# Patient Record
Sex: Male | Born: 1965
Health system: Southern US, Community
[De-identification: ages and names within clinical notes are randomized; demographics above are authoritative.]

## PROBLEM LIST (undated history)

## (undated) DIAGNOSIS — R42 Dizziness and giddiness: Secondary | ICD-10-CM

## (undated) HISTORY — DX: Dizziness and giddiness: R42

---

## 2007-10-09 ENCOUNTER — Ambulatory Visit (HOSPITAL_COMMUNITY): Admission: RE | Admit: 2007-10-09 | Discharge: 2007-10-09 | Payer: Self-pay | Admitting: Orthopedic Surgery

## 2015-03-10 DIAGNOSIS — I25110 Atherosclerotic heart disease of native coronary artery with unstable angina pectoris: Secondary | ICD-10-CM

## 2015-03-10 DIAGNOSIS — I10 Essential (primary) hypertension: Secondary | ICD-10-CM | POA: Insufficient documentation

## 2015-03-10 DIAGNOSIS — E785 Hyperlipidemia, unspecified: Secondary | ICD-10-CM | POA: Insufficient documentation

## 2015-03-10 DIAGNOSIS — E1169 Type 2 diabetes mellitus with other specified complication: Secondary | ICD-10-CM | POA: Insufficient documentation

## 2015-03-10 DIAGNOSIS — I2510 Atherosclerotic heart disease of native coronary artery without angina pectoris: Secondary | ICD-10-CM

## 2015-03-10 DIAGNOSIS — Z6841 Body Mass Index (BMI) 40.0 and over, adult: Secondary | ICD-10-CM

## 2015-03-10 HISTORY — DX: Hyperlipidemia, unspecified: E78.5

## 2015-03-10 HISTORY — DX: Essential (primary) hypertension: I10

## 2015-03-10 HISTORY — DX: Atherosclerotic heart disease of native coronary artery with unstable angina pectoris: I25.110

## 2015-03-10 HISTORY — DX: Morbid (severe) obesity due to excess calories: E66.01

## 2015-03-10 HISTORY — DX: Body Mass Index (BMI) 40.0 and over, adult: Z684

## 2015-03-10 HISTORY — DX: Atherosclerotic heart disease of native coronary artery without angina pectoris: I25.10

## 2015-03-10 HISTORY — DX: Type 2 diabetes mellitus with other specified complication: E11.69

## 2015-06-19 DIAGNOSIS — E088 Diabetes mellitus due to underlying condition with unspecified complications: Secondary | ICD-10-CM | POA: Insufficient documentation

## 2015-06-19 HISTORY — DX: Diabetes mellitus due to underlying condition with unspecified complications: E08.8

## 2016-02-16 DIAGNOSIS — I10 Essential (primary) hypertension: Secondary | ICD-10-CM | POA: Diagnosis not present

## 2016-02-16 DIAGNOSIS — Z1389 Encounter for screening for other disorder: Secondary | ICD-10-CM | POA: Diagnosis not present

## 2016-02-16 DIAGNOSIS — Z79899 Other long term (current) drug therapy: Secondary | ICD-10-CM | POA: Diagnosis not present

## 2016-02-16 DIAGNOSIS — E1165 Type 2 diabetes mellitus with hyperglycemia: Secondary | ICD-10-CM | POA: Diagnosis not present

## 2016-02-16 DIAGNOSIS — E785 Hyperlipidemia, unspecified: Secondary | ICD-10-CM | POA: Diagnosis not present

## 2016-05-21 DIAGNOSIS — I10 Essential (primary) hypertension: Secondary | ICD-10-CM | POA: Diagnosis not present

## 2016-05-21 DIAGNOSIS — Z23 Encounter for immunization: Secondary | ICD-10-CM | POA: Diagnosis not present

## 2016-05-21 DIAGNOSIS — Z125 Encounter for screening for malignant neoplasm of prostate: Secondary | ICD-10-CM | POA: Diagnosis not present

## 2016-05-21 DIAGNOSIS — E1165 Type 2 diabetes mellitus with hyperglycemia: Secondary | ICD-10-CM | POA: Diagnosis not present

## 2016-05-21 DIAGNOSIS — G4730 Sleep apnea, unspecified: Secondary | ICD-10-CM | POA: Diagnosis not present

## 2016-05-21 DIAGNOSIS — E785 Hyperlipidemia, unspecified: Secondary | ICD-10-CM | POA: Diagnosis not present

## 2016-05-31 DIAGNOSIS — L810 Postinflammatory hyperpigmentation: Secondary | ICD-10-CM | POA: Diagnosis not present

## 2016-05-31 DIAGNOSIS — L820 Inflamed seborrheic keratosis: Secondary | ICD-10-CM | POA: Diagnosis not present

## 2016-05-31 DIAGNOSIS — C44619 Basal cell carcinoma of skin of left upper limb, including shoulder: Secondary | ICD-10-CM | POA: Diagnosis not present

## 2016-06-11 DIAGNOSIS — M7751 Other enthesopathy of right foot: Secondary | ICD-10-CM

## 2016-06-11 DIAGNOSIS — E1165 Type 2 diabetes mellitus with hyperglycemia: Secondary | ICD-10-CM | POA: Diagnosis not present

## 2016-06-11 DIAGNOSIS — IMO0001 Reserved for inherently not codable concepts without codable children: Secondary | ICD-10-CM

## 2016-06-11 DIAGNOSIS — Z794 Long term (current) use of insulin: Secondary | ICD-10-CM

## 2016-06-11 DIAGNOSIS — M7752 Other enthesopathy of left foot: Secondary | ICD-10-CM | POA: Diagnosis not present

## 2016-06-11 HISTORY — DX: Reserved for inherently not codable concepts without codable children: IMO0001

## 2016-06-11 HISTORY — DX: Other enthesopathy of right foot and ankle: M77.51

## 2016-06-21 DIAGNOSIS — L609 Nail disorder, unspecified: Secondary | ICD-10-CM | POA: Insufficient documentation

## 2016-06-21 DIAGNOSIS — E1165 Type 2 diabetes mellitus with hyperglycemia: Secondary | ICD-10-CM | POA: Diagnosis not present

## 2016-06-21 DIAGNOSIS — S99922A Unspecified injury of left foot, initial encounter: Secondary | ICD-10-CM | POA: Insufficient documentation

## 2016-06-21 DIAGNOSIS — Z794 Long term (current) use of insulin: Secondary | ICD-10-CM | POA: Diagnosis not present

## 2016-06-21 HISTORY — DX: Unspecified injury of left foot, initial encounter: S99.922A

## 2016-06-21 HISTORY — DX: Nail disorder, unspecified: L60.9

## 2016-07-09 DIAGNOSIS — S99922D Unspecified injury of left foot, subsequent encounter: Secondary | ICD-10-CM | POA: Diagnosis not present

## 2016-07-09 DIAGNOSIS — M7751 Other enthesopathy of right foot: Secondary | ICD-10-CM | POA: Diagnosis not present

## 2016-07-09 DIAGNOSIS — M7752 Other enthesopathy of left foot: Secondary | ICD-10-CM | POA: Diagnosis not present

## 2016-07-22 DIAGNOSIS — R0602 Shortness of breath: Secondary | ICD-10-CM | POA: Diagnosis not present

## 2016-07-22 DIAGNOSIS — G4730 Sleep apnea, unspecified: Secondary | ICD-10-CM | POA: Diagnosis not present

## 2016-08-02 DIAGNOSIS — J45901 Unspecified asthma with (acute) exacerbation: Secondary | ICD-10-CM | POA: Diagnosis not present

## 2016-08-05 DIAGNOSIS — J45909 Unspecified asthma, uncomplicated: Secondary | ICD-10-CM | POA: Diagnosis not present

## 2016-08-05 DIAGNOSIS — R0602 Shortness of breath: Secondary | ICD-10-CM | POA: Diagnosis not present

## 2016-08-05 DIAGNOSIS — G4700 Insomnia, unspecified: Secondary | ICD-10-CM | POA: Diagnosis not present

## 2016-08-28 DIAGNOSIS — E1165 Type 2 diabetes mellitus with hyperglycemia: Secondary | ICD-10-CM | POA: Diagnosis not present

## 2016-08-28 DIAGNOSIS — E785 Hyperlipidemia, unspecified: Secondary | ICD-10-CM | POA: Diagnosis not present

## 2016-08-28 DIAGNOSIS — I2510 Atherosclerotic heart disease of native coronary artery without angina pectoris: Secondary | ICD-10-CM | POA: Diagnosis not present

## 2016-08-28 DIAGNOSIS — J45909 Unspecified asthma, uncomplicated: Secondary | ICD-10-CM | POA: Diagnosis not present

## 2016-08-28 DIAGNOSIS — S0500XA Injury of conjunctiva and corneal abrasion without foreign body, unspecified eye, initial encounter: Secondary | ICD-10-CM | POA: Diagnosis not present

## 2016-08-28 DIAGNOSIS — I10 Essential (primary) hypertension: Secondary | ICD-10-CM | POA: Diagnosis not present

## 2016-08-29 DIAGNOSIS — S00211A Abrasion of right eyelid and periocular area, initial encounter: Secondary | ICD-10-CM | POA: Diagnosis not present

## 2016-10-16 DIAGNOSIS — H524 Presbyopia: Secondary | ICD-10-CM | POA: Diagnosis not present

## 2016-10-16 DIAGNOSIS — E113412 Type 2 diabetes mellitus with severe nonproliferative diabetic retinopathy with macular edema, left eye: Secondary | ICD-10-CM | POA: Diagnosis not present

## 2016-10-21 DIAGNOSIS — G4733 Obstructive sleep apnea (adult) (pediatric): Secondary | ICD-10-CM | POA: Diagnosis not present

## 2016-11-04 DIAGNOSIS — R0602 Shortness of breath: Secondary | ICD-10-CM | POA: Diagnosis not present

## 2016-11-04 DIAGNOSIS — G4730 Sleep apnea, unspecified: Secondary | ICD-10-CM | POA: Diagnosis not present

## 2016-11-04 DIAGNOSIS — J9801 Acute bronchospasm: Secondary | ICD-10-CM | POA: Diagnosis not present

## 2016-11-05 DIAGNOSIS — I2510 Atherosclerotic heart disease of native coronary artery without angina pectoris: Secondary | ICD-10-CM | POA: Diagnosis not present

## 2016-11-05 DIAGNOSIS — I10 Essential (primary) hypertension: Secondary | ICD-10-CM | POA: Diagnosis not present

## 2016-11-05 DIAGNOSIS — E088 Diabetes mellitus due to underlying condition with unspecified complications: Secondary | ICD-10-CM | POA: Diagnosis not present

## 2016-11-05 DIAGNOSIS — E785 Hyperlipidemia, unspecified: Secondary | ICD-10-CM | POA: Diagnosis not present

## 2016-12-25 DIAGNOSIS — E113412 Type 2 diabetes mellitus with severe nonproliferative diabetic retinopathy with macular edema, left eye: Secondary | ICD-10-CM | POA: Diagnosis not present

## 2017-01-04 DIAGNOSIS — R0600 Dyspnea, unspecified: Secondary | ICD-10-CM | POA: Diagnosis not present

## 2017-01-04 DIAGNOSIS — Z6841 Body Mass Index (BMI) 40.0 and over, adult: Secondary | ICD-10-CM | POA: Diagnosis not present

## 2017-01-04 DIAGNOSIS — R0902 Hypoxemia: Secondary | ICD-10-CM | POA: Diagnosis not present

## 2017-01-04 DIAGNOSIS — Z79899 Other long term (current) drug therapy: Secondary | ICD-10-CM | POA: Diagnosis not present

## 2017-01-04 DIAGNOSIS — R072 Precordial pain: Secondary | ICD-10-CM | POA: Diagnosis not present

## 2017-01-04 DIAGNOSIS — R000 Tachycardia, unspecified: Secondary | ICD-10-CM | POA: Diagnosis not present

## 2017-01-04 DIAGNOSIS — E119 Type 2 diabetes mellitus without complications: Secondary | ICD-10-CM | POA: Diagnosis not present

## 2017-01-04 DIAGNOSIS — Z794 Long term (current) use of insulin: Secondary | ICD-10-CM | POA: Diagnosis not present

## 2017-01-04 DIAGNOSIS — E785 Hyperlipidemia, unspecified: Secondary | ICD-10-CM | POA: Diagnosis not present

## 2017-01-04 DIAGNOSIS — Z713 Dietary counseling and surveillance: Secondary | ICD-10-CM | POA: Diagnosis not present

## 2017-01-04 DIAGNOSIS — I2510 Atherosclerotic heart disease of native coronary artery without angina pectoris: Secondary | ICD-10-CM | POA: Diagnosis not present

## 2017-01-04 DIAGNOSIS — R079 Chest pain, unspecified: Secondary | ICD-10-CM | POA: Diagnosis not present

## 2017-01-04 DIAGNOSIS — J45909 Unspecified asthma, uncomplicated: Secondary | ICD-10-CM | POA: Diagnosis not present

## 2017-01-04 DIAGNOSIS — R0602 Shortness of breath: Secondary | ICD-10-CM | POA: Diagnosis not present

## 2017-01-04 DIAGNOSIS — Z955 Presence of coronary angioplasty implant and graft: Secondary | ICD-10-CM | POA: Diagnosis not present

## 2017-01-04 DIAGNOSIS — I10 Essential (primary) hypertension: Secondary | ICD-10-CM | POA: Diagnosis not present

## 2017-01-05 DIAGNOSIS — I471 Supraventricular tachycardia: Secondary | ICD-10-CM | POA: Diagnosis not present

## 2017-01-05 DIAGNOSIS — I10 Essential (primary) hypertension: Secondary | ICD-10-CM | POA: Diagnosis not present

## 2017-01-05 DIAGNOSIS — E785 Hyperlipidemia, unspecified: Secondary | ICD-10-CM | POA: Diagnosis not present

## 2017-01-05 DIAGNOSIS — E119 Type 2 diabetes mellitus without complications: Secondary | ICD-10-CM | POA: Diagnosis not present

## 2017-01-05 DIAGNOSIS — R079 Chest pain, unspecified: Secondary | ICD-10-CM | POA: Diagnosis not present

## 2017-01-05 DIAGNOSIS — R0789 Other chest pain: Secondary | ICD-10-CM | POA: Diagnosis not present

## 2017-01-05 DIAGNOSIS — R0600 Dyspnea, unspecified: Secondary | ICD-10-CM | POA: Diagnosis not present

## 2017-01-06 DIAGNOSIS — E119 Type 2 diabetes mellitus without complications: Secondary | ICD-10-CM | POA: Diagnosis not present

## 2017-01-06 DIAGNOSIS — E785 Hyperlipidemia, unspecified: Secondary | ICD-10-CM | POA: Diagnosis not present

## 2017-01-06 DIAGNOSIS — R0600 Dyspnea, unspecified: Secondary | ICD-10-CM | POA: Diagnosis not present

## 2017-01-06 DIAGNOSIS — I10 Essential (primary) hypertension: Secondary | ICD-10-CM | POA: Diagnosis not present

## 2017-01-06 DIAGNOSIS — R079 Chest pain, unspecified: Secondary | ICD-10-CM | POA: Diagnosis not present

## 2017-01-06 DIAGNOSIS — R0789 Other chest pain: Secondary | ICD-10-CM | POA: Diagnosis not present

## 2017-01-06 DIAGNOSIS — I2510 Atherosclerotic heart disease of native coronary artery without angina pectoris: Secondary | ICD-10-CM | POA: Diagnosis not present

## 2017-01-09 DIAGNOSIS — H35033 Hypertensive retinopathy, bilateral: Secondary | ICD-10-CM | POA: Diagnosis not present

## 2017-01-09 DIAGNOSIS — E113493 Type 2 diabetes mellitus with severe nonproliferative diabetic retinopathy without macular edema, bilateral: Secondary | ICD-10-CM | POA: Diagnosis not present

## 2017-01-09 DIAGNOSIS — H43813 Vitreous degeneration, bilateral: Secondary | ICD-10-CM | POA: Diagnosis not present

## 2017-01-14 DIAGNOSIS — Z09 Encounter for follow-up examination after completed treatment for conditions other than malignant neoplasm: Secondary | ICD-10-CM | POA: Diagnosis not present

## 2017-01-14 DIAGNOSIS — E1165 Type 2 diabetes mellitus with hyperglycemia: Secondary | ICD-10-CM | POA: Diagnosis not present

## 2017-01-14 DIAGNOSIS — I2510 Atherosclerotic heart disease of native coronary artery without angina pectoris: Secondary | ICD-10-CM | POA: Diagnosis not present

## 2017-01-14 DIAGNOSIS — Z6841 Body Mass Index (BMI) 40.0 and over, adult: Secondary | ICD-10-CM | POA: Diagnosis not present

## 2017-01-14 DIAGNOSIS — Z79899 Other long term (current) drug therapy: Secondary | ICD-10-CM | POA: Diagnosis not present

## 2017-02-21 DIAGNOSIS — I471 Supraventricular tachycardia, unspecified: Secondary | ICD-10-CM | POA: Insufficient documentation

## 2017-02-21 HISTORY — DX: Supraventricular tachycardia, unspecified: I47.10

## 2017-02-21 HISTORY — DX: Supraventricular tachycardia: I47.1

## 2017-02-24 DIAGNOSIS — Z9989 Dependence on other enabling machines and devices: Secondary | ICD-10-CM | POA: Insufficient documentation

## 2017-02-24 DIAGNOSIS — G4733 Obstructive sleep apnea (adult) (pediatric): Secondary | ICD-10-CM

## 2017-02-24 DIAGNOSIS — R002 Palpitations: Secondary | ICD-10-CM | POA: Insufficient documentation

## 2017-02-24 DIAGNOSIS — I2510 Atherosclerotic heart disease of native coronary artery without angina pectoris: Secondary | ICD-10-CM | POA: Diagnosis not present

## 2017-02-24 DIAGNOSIS — I471 Supraventricular tachycardia: Secondary | ICD-10-CM | POA: Diagnosis not present

## 2017-02-24 DIAGNOSIS — E785 Hyperlipidemia, unspecified: Secondary | ICD-10-CM | POA: Diagnosis not present

## 2017-02-24 DIAGNOSIS — I10 Essential (primary) hypertension: Secondary | ICD-10-CM | POA: Diagnosis not present

## 2017-02-24 DIAGNOSIS — Z955 Presence of coronary angioplasty implant and graft: Secondary | ICD-10-CM | POA: Insufficient documentation

## 2017-02-24 HISTORY — DX: Dependence on other enabling machines and devices: Z99.89

## 2017-02-24 HISTORY — DX: Obstructive sleep apnea (adult) (pediatric): G47.33

## 2017-03-08 DIAGNOSIS — R002 Palpitations: Secondary | ICD-10-CM | POA: Diagnosis not present

## 2017-03-13 DIAGNOSIS — G4733 Obstructive sleep apnea (adult) (pediatric): Secondary | ICD-10-CM | POA: Diagnosis not present

## 2017-04-06 DIAGNOSIS — R002 Palpitations: Secondary | ICD-10-CM | POA: Diagnosis not present

## 2017-06-02 DIAGNOSIS — Z23 Encounter for immunization: Secondary | ICD-10-CM | POA: Diagnosis not present

## 2017-06-09 DIAGNOSIS — Z794 Long term (current) use of insulin: Secondary | ICD-10-CM | POA: Diagnosis not present

## 2017-06-09 DIAGNOSIS — S92014A Nondisplaced fracture of body of right calcaneus, initial encounter for closed fracture: Secondary | ICD-10-CM | POA: Insufficient documentation

## 2017-06-09 DIAGNOSIS — M79671 Pain in right foot: Secondary | ICD-10-CM | POA: Insufficient documentation

## 2017-06-09 DIAGNOSIS — S9031XA Contusion of right foot, initial encounter: Secondary | ICD-10-CM

## 2017-06-09 DIAGNOSIS — E1165 Type 2 diabetes mellitus with hyperglycemia: Secondary | ICD-10-CM | POA: Diagnosis not present

## 2017-06-09 HISTORY — DX: Pain in right foot: M79.671

## 2017-06-09 HISTORY — DX: Nondisplaced fracture of body of right calcaneus, initial encounter for closed fracture: S92.014A

## 2017-06-09 HISTORY — DX: Contusion of right foot, initial encounter: S90.31XA

## 2017-06-18 DIAGNOSIS — R040 Epistaxis: Secondary | ICD-10-CM | POA: Diagnosis not present

## 2017-06-18 DIAGNOSIS — M545 Low back pain: Secondary | ICD-10-CM | POA: Diagnosis not present

## 2017-06-23 DIAGNOSIS — R040 Epistaxis: Secondary | ICD-10-CM | POA: Diagnosis not present

## 2017-06-30 DIAGNOSIS — H43813 Vitreous degeneration, bilateral: Secondary | ICD-10-CM | POA: Diagnosis not present

## 2017-06-30 DIAGNOSIS — E113492 Type 2 diabetes mellitus with severe nonproliferative diabetic retinopathy without macular edema, left eye: Secondary | ICD-10-CM | POA: Diagnosis not present

## 2017-06-30 DIAGNOSIS — H35033 Hypertensive retinopathy, bilateral: Secondary | ICD-10-CM | POA: Diagnosis not present

## 2017-06-30 DIAGNOSIS — E113411 Type 2 diabetes mellitus with severe nonproliferative diabetic retinopathy with macular edema, right eye: Secondary | ICD-10-CM | POA: Diagnosis not present

## 2017-06-30 DIAGNOSIS — Z6841 Body Mass Index (BMI) 40.0 and over, adult: Secondary | ICD-10-CM | POA: Diagnosis not present

## 2017-06-30 DIAGNOSIS — I10 Essential (primary) hypertension: Secondary | ICD-10-CM | POA: Diagnosis not present

## 2017-07-07 DIAGNOSIS — R040 Epistaxis: Secondary | ICD-10-CM | POA: Diagnosis not present

## 2017-07-07 DIAGNOSIS — I10 Essential (primary) hypertension: Secondary | ICD-10-CM | POA: Diagnosis not present

## 2017-07-14 DIAGNOSIS — G4733 Obstructive sleep apnea (adult) (pediatric): Secondary | ICD-10-CM | POA: Diagnosis not present

## 2017-07-15 DIAGNOSIS — I2510 Atherosclerotic heart disease of native coronary artery without angina pectoris: Secondary | ICD-10-CM | POA: Diagnosis not present

## 2017-07-15 DIAGNOSIS — G4730 Sleep apnea, unspecified: Secondary | ICD-10-CM | POA: Diagnosis not present

## 2017-07-15 DIAGNOSIS — Z1331 Encounter for screening for depression: Secondary | ICD-10-CM | POA: Diagnosis not present

## 2017-07-15 DIAGNOSIS — E1165 Type 2 diabetes mellitus with hyperglycemia: Secondary | ICD-10-CM | POA: Diagnosis not present

## 2017-07-15 DIAGNOSIS — Z125 Encounter for screening for malignant neoplasm of prostate: Secondary | ICD-10-CM | POA: Diagnosis not present

## 2017-07-15 DIAGNOSIS — I10 Essential (primary) hypertension: Secondary | ICD-10-CM | POA: Diagnosis not present

## 2017-10-01 ENCOUNTER — Ambulatory Visit (HOSPITAL_BASED_OUTPATIENT_CLINIC_OR_DEPARTMENT_OTHER)
Admission: RE | Admit: 2017-10-01 | Discharge: 2017-10-01 | Disposition: A | Discharge status: Home / Self Care | Payer: BLUE CROSS/BLUE SHIELD | Source: Ambulatory Visit | Attending: Cardiology | Admitting: Cardiology

## 2017-10-01 ENCOUNTER — Ambulatory Visit: Payer: BLUE CROSS/BLUE SHIELD | Admitting: Cardiology

## 2017-10-01 ENCOUNTER — Encounter: Payer: Self-pay | Admitting: Cardiology

## 2017-10-01 VITALS — BP 150/74 | HR 84 | Ht 74.0 in | Wt 361.4 lb

## 2017-10-01 DIAGNOSIS — Z6841 Body Mass Index (BMI) 40.0 and over, adult: Secondary | ICD-10-CM | POA: Diagnosis not present

## 2017-10-01 DIAGNOSIS — Z01818 Encounter for other preprocedural examination: Secondary | ICD-10-CM | POA: Diagnosis not present

## 2017-10-01 DIAGNOSIS — I2510 Atherosclerotic heart disease of native coronary artery without angina pectoris: Secondary | ICD-10-CM

## 2017-10-01 DIAGNOSIS — R0602 Shortness of breath: Secondary | ICD-10-CM

## 2017-10-01 DIAGNOSIS — M79671 Pain in right foot: Secondary | ICD-10-CM | POA: Diagnosis not present

## 2017-10-01 DIAGNOSIS — E785 Hyperlipidemia, unspecified: Secondary | ICD-10-CM | POA: Diagnosis not present

## 2017-10-01 DIAGNOSIS — G4733 Obstructive sleep apnea (adult) (pediatric): Secondary | ICD-10-CM | POA: Diagnosis not present

## 2017-10-01 DIAGNOSIS — I10 Essential (primary) hypertension: Secondary | ICD-10-CM

## 2017-10-01 DIAGNOSIS — R0600 Dyspnea, unspecified: Secondary | ICD-10-CM

## 2017-10-01 DIAGNOSIS — R0609 Other forms of dyspnea: Secondary | ICD-10-CM

## 2017-10-01 DIAGNOSIS — I471 Supraventricular tachycardia: Secondary | ICD-10-CM

## 2017-10-01 DIAGNOSIS — E088 Diabetes mellitus due to underlying condition with unspecified complications: Secondary | ICD-10-CM

## 2017-10-01 DIAGNOSIS — Z9989 Dependence on other enabling machines and devices: Secondary | ICD-10-CM

## 2017-10-01 HISTORY — DX: Shortness of breath: R06.02

## 2017-10-01 MED ORDER — NITROGLYCERIN 0.4 MG SL SUBL: 0.4000 mg | SUBLINGUAL_TABLET | SUBLINGUAL | 6 refills | Status: DC | PRN

## 2017-10-01 NOTE — Progress Notes (Signed)
Cardiology Office Note:    Date:  10/01/2017   ID:  Kevin Lawrence, DOB 06/29/1966, MRN [id number]  PCP:  Campbell, Stephen D., MD  Cardiologist:  Rajan R Revankar, MD   Referring MD: No ref. provider found    ASSESSMENT:    1. DOE (dyspnea on exertion)   2. CAD in native artery   3. Essential hypertension   4. SVT (supraventricular tachycardia) (HCC)   5. Diabetes mellitus due to underlying condition with complication, unspecified whether long term insulin use (HCC)   6. OSA on CPAP   7. Dyslipidemia   8. Morbid obesity with BMI of 45.0-49.9, adult (HCC)   9. Right foot pain    PLAN:    In order of problems listed above:  1. Secondary prevention stressed with patient.  Importance of compliance with diet and medications stressed and he vocalized understanding. 2. Diet was discussed for dyslipidemia and diabetes mellitus.  Weight reduction was stressed.  Risks of obesity explained and he tells me that he is going to work on this to make it better. 3. The patient's symptoms are very concerning.  He has multiple continued risk factors for coronary artery disease.In view of the patient's symptoms, I discussed with the patient options for evaluation. Invasive and noninvasive options were given to the patient. I discussed stress testing and coronary angiography and left heart catheterization at length. Benefits, pros and cons of each approach were discussed at length. Patient had multiple questions which were answered to the patient's satisfaction. Patient opted for invasive evaluation and we will set up for coronary angiography and left heart catheterization. Further recommendations will be made based on the findings with coronary angiography. In the interim if the patient has any significant symptoms in hospital to the nearest emergency room. 4. Sublingual nitroglycerin prescription was sent, its protocol and 911 protocol explained and the patient vocalized understanding questions were  answered to the patient's satisfaction    Medication Adjustments/Labs and Tests Ordered: Current medicines are reviewed at length with the patient today.  Concerns regarding medicines are outlined above.  No orders of the defined types were placed in this encounter.  No orders of the defined types were placed in this encounter.    Chief Complaint  Patient presents with  . Follow-up    SOB and chest tightness.     History of Present Illness:    Kevin Lawrence is a 51 y.o. male .  Patient has known coronary artery disease and has undergone coronary angiography in 2016.  He underwent coronary stenting.  He had multiple other nonobstructive disease at that time.  Unfortunately this gentleman has not done his best in terms of secondary prevention.  Is morbidly obese, he does not exercise on a regular basis and has a very sedentary lifestyle.  He has a history of essential hypertension, dyslipidemia and diabetes mellitus.  Now he mentions to me that he has shortness of breath of exertion which is out of proportion to what he is generally used to.  This is getting worse.  This patient has been under my care in my previous practice.  He is here now to transfer his care and be established with my current practice.  At the time of my evaluation, the patient is alert awake oriented and in no distress.  The patient mentions to me that he has been seen by a cardiologist in my previous practice.  I reviewed that record and found that the patient has significant PVCs   that are happening which at times give him symptoms.   Current Medications: Current Meds  Medication Sig  . amLODipine (NORVASC) 10 MG tablet   . amLODipine (NORVASC) 5 MG tablet Take by mouth.  . aspirin EC 81 MG tablet Take by mouth.  . BAYER CONTOUR TEST test strip   . budesonide-formoterol (SYMBICORT) 160-4.5 MCG/ACT inhaler   . carvedilol (COREG) 12.5 MG tablet Take by mouth.  . carvedilol (COREG) 25 MG tablet Take 25 mg by mouth 2  (two) times daily.  . colesevelam (WELCHOL) 625 MG tablet Take by mouth.  . hydrALAZINE (APRESOLINE) 25 MG tablet Take 25 mg by mouth 4 (four) times daily.  . liraglutide (VICTOZA) 18 MG/3ML SOPN   . losartan (COZAAR) 100 MG tablet Take by mouth.  . metoprolol succinate (TOPROL-XL) 100 MG 24 hr tablet Take 100 mg by mouth.  . Multiple Vitamin (MULTI-VITAMINS) TABS Take by mouth.  . nitroGLYCERIN (NITROSTAT) 0.4 MG SL tablet Place under the tongue.  . pioglitazone-metformin (ACTOPLUS MET) 15-850 MG tablet Take by mouth.  . prasugrel (EFFIENT) 10 MG TABS tablet TAKE 1 TABLET (10 MG TOTAL) BY MOUTH DAILY.  . rosuvastatin (CRESTOR) 40 MG tablet Take 40 mg by mouth daily.  . TOUJEO SOLOSTAR 300 UNIT/ML SOPN INJECT 80 UNITS DAILY  . VICTOZA 18 MG/3ML SOPN   . WELCHOL 625 MG tablet Take 1,875 mg by mouth 2 (two) times daily.  . [DISCONTINUED] prasugrel (EFFIENT) 10 MG TABS tablet Take 10 mg by mouth.     Allergies:   Codeine and Hydrocodone-acetaminophen   Social History   Socioeconomic History  . Marital status: Single    Spouse name: None  . Number of children: None  . Years of education: None  . Highest education level: None  Social Needs  . Financial resource strain: None  . Food insecurity - worry: None  . Food insecurity - inability: None  . Transportation needs - medical: None  . Transportation needs - non-medical: None  Occupational History  . None  Tobacco Use  . Smoking status: Former Smoker  . Smokeless tobacco: Former User  Substance and Sexual Activity  . Alcohol use: None  . Drug use: None  . Sexual activity: None  Other Topics Concern  . None  Social History Narrative  . None     Family History: The patient's family history includes Cancer in his mother; Heart disease in his father; Stroke in his mother.  ROS:   Please see the history of present illness.    All other systems reviewed and are negative.  EKGs/Labs/Other Studies Reviewed:    The following  studies were reviewed today: EKG done today reveals sinus rhythm and nonspecific ST-T changes   Recent Labs: No results found for requested labs within last 8760 hours.  Recent Lipid Panel No results found for: CHOL, TRIG, HDL, CHOLHDL, VLDL, LDLCALC, LDLDIRECT  Physical Exam:    VS:  BP (!) 150/74 (BP Location: Left Arm, Patient Position: Sitting, Cuff Size: Large)   Pulse 84   Ht 6' 2" (1.88 m)   Wt (!) 361 lb 6.4 oz (163.9 kg)   SpO2 98%   BMI 46.40 kg/m     Wt Readings from Last 3 Encounters:  10/01/17 (!) 361 lb 6.4 oz (163.9 kg)     GEN: Patient is in no acute distress HEENT: Normal NECK: No JVD; No carotid bruits LYMPHATICS: No lymphadenopathy CARDIAC: Hear sounds regular, 2/6 systolic murmur at the apex. RESPIRATORY:  Clear to   auscultation without rales, wheezing or rhonchi  ABDOMEN: Soft, non-tender, non-distended MUSCULOSKELETAL:  No edema; No deformity  SKIN: Warm and dry NEUROLOGIC:  Alert and oriented x 3 PSYCHIATRIC:  Normal affect   Signed, Jenean Lindau, MD  10/01/2017 2:21 PM    [facility]

## 2017-10-01 NOTE — H&P (View-Only) (Signed)
Cardiology Office Note:    Date:  10/01/2017   ID:  Kevin Lawrence, DOB March 10, 1966, MRN [id number]  PCP:  Helen Hashimoto., MD  Cardiologist:  Jenean Lindau, MD   Referring MD: No ref. provider found    ASSESSMENT:    1. DOE (dyspnea on exertion)   2. CAD in native artery   3. Essential hypertension   4. SVT (supraventricular tachycardia) ([place])   5. Diabetes mellitus due to underlying condition with complication, unspecified whether long term insulin use ([place])   6. OSA on CPAP   7. Dyslipidemia   8. Morbid obesity with BMI of 45.0-49.9, adult ([place])   9. Right foot pain    PLAN:    In order of problems listed above:  1. Secondary prevention stressed with patient.  Importance of compliance with diet and medications stressed and he vocalized understanding. 2. Diet was discussed for dyslipidemia and diabetes mellitus.  Weight reduction was stressed.  Risks of obesity explained and he tells me that he is going to work on this to make it better. 3. The patient's symptoms are very concerning.  He has multiple continued risk factors for coronary artery disease.In view of the patient's symptoms, I discussed with the patient options for evaluation. Invasive and noninvasive options were given to the patient. I discussed stress testing and coronary angiography and left heart catheterization at length. Benefits, pros and cons of each approach were discussed at length. Patient had multiple questions which were answered to the patient's satisfaction. Patient opted for invasive evaluation and we will set up for coronary angiography and left heart catheterization. Further recommendations will be made based on the findings with coronary angiography. In the interim if the patient has any significant symptoms in hospital to the nearest emergency room. 4. Sublingual nitroglycerin prescription was sent, its protocol and 911 protocol explained and the patient vocalized understanding questions were  answered to the patient's satisfaction    Medication Adjustments/Labs and Tests Ordered: Current medicines are reviewed at length with the patient today.  Concerns regarding medicines are outlined above.  No orders of the defined types were placed in this encounter.  No orders of the defined types were placed in this encounter.    Chief Complaint  Patient presents with  . Follow-up    SOB and chest tightness.     History of Present Illness:    Kevin Lawrence is a 52 y.o. male .  Patient has known coronary artery disease and has undergone coronary angiography in 2016.  He underwent coronary stenting.  He had multiple other nonobstructive disease at that time.  Unfortunately this gentleman has not done his best in terms of secondary prevention.  Is morbidly obese, he does not exercise on a regular basis and has a very sedentary lifestyle.  He has a history of essential hypertension, dyslipidemia and diabetes mellitus.  Now he mentions to me that he has shortness of breath of exertion which is out of proportion to what he is generally used to.  This is getting worse.  This patient has been under my care in my previous practice.  He is here now to transfer his care and be established with my current practice.  At the time of my evaluation, the patient is alert awake oriented and in no distress.  The patient mentions to me that he has been seen by a cardiologist in my previous practice.  I reviewed that record and found that the patient has significant PVCs  that are happening which at times give him symptoms.   Current Medications: Current Meds  Medication Sig  . amLODipine (NORVASC) 10 MG tablet   . amLODipine (NORVASC) 5 MG tablet Take by mouth.  Marland Kitchen aspirin EC 81 MG tablet Take by mouth.  Marland Kitchen BAYER CONTOUR TEST test strip   . budesonide-formoterol (SYMBICORT) 160-4.5 MCG/ACT inhaler   . carvedilol (COREG) 12.5 MG tablet Take by mouth.  . carvedilol (COREG) 25 MG tablet Take 25 mg by mouth 2  (two) times daily.  . colesevelam (WELCHOL) 625 MG tablet Take by mouth.  . hydrALAZINE (APRESOLINE) 25 MG tablet Take 25 mg by mouth 4 (four) times daily.  Marland Kitchen liraglutide (VICTOZA) 18 MG/3ML SOPN   . losartan (COZAAR) 100 MG tablet Take by mouth.  . metoprolol succinate (TOPROL-XL) 100 MG 24 hr tablet Take 100 mg by mouth.  . Multiple Vitamin (MULTI-VITAMINS) TABS Take by mouth.  . nitroGLYCERIN (NITROSTAT) 0.4 MG SL tablet Place under the tongue.  . pioglitazone-metformin (ACTOPLUS MET) 15-850 MG tablet Take by mouth.  . prasugrel (EFFIENT) 10 MG TABS tablet TAKE 1 TABLET (10 MG TOTAL) BY MOUTH DAILY.  . rosuvastatin (CRESTOR) 40 MG tablet Take 40 mg by mouth daily.  Nelva Nay SOLOSTAR 300 UNIT/ML SOPN INJECT 80 UNITS DAILY  . VICTOZA 18 MG/3ML SOPN   . WELCHOL 625 MG tablet Take 1,875 mg by mouth 2 (two) times daily.  . [DISCONTINUED] prasugrel (EFFIENT) 10 MG TABS tablet Take 10 mg by mouth.     Allergies:   Codeine and Hydrocodone-acetaminophen   Social History   Socioeconomic History  . Marital status: Single    Spouse name: None  . Number of children: None  . Years of education: None  . Highest education level: None  Social Needs  . Financial resource strain: None  . Food insecurity - worry: None  . Food insecurity - inability: None  . Transportation needs - medical: None  . Transportation needs - non-medical: None  Occupational History  . None  Tobacco Use  . Smoking status: Former Research scientist (life sciences)  . Smokeless tobacco: Former Network engineer and Sexual Activity  . Alcohol use: None  . Drug use: None  . Sexual activity: None  Other Topics Concern  . None  Social History Narrative  . None     Family History: The patient's family history includes Cancer in his mother; Heart disease in his father; Stroke in his mother.  ROS:   Please see the history of present illness.    All other systems reviewed and are negative.  EKGs/Labs/Other Studies Reviewed:    The following  studies were reviewed today: EKG done today reveals sinus rhythm and nonspecific ST-T changes   Recent Labs: No results found for requested labs within last 8760 hours.  Recent Lipid Panel No results found for: CHOL, TRIG, HDL, CHOLHDL, VLDL, LDLCALC, LDLDIRECT  Physical Exam:    VS:  BP (!) 150/74 (BP Location: Left Arm, Patient Position: Sitting, Cuff Size: Large)   Pulse 84   Ht 6' 2" (1.88 m)   Wt (!) 361 lb 6.4 oz (163.9 kg)   SpO2 98%   BMI 46.40 kg/m     Wt Readings from Last 3 Encounters:  10/01/17 (!) 361 lb 6.4 oz (163.9 kg)     GEN: Patient is in no acute distress HEENT: Normal NECK: No JVD; No carotid bruits LYMPHATICS: No lymphadenopathy CARDIAC: Hear sounds regular, 2/6 systolic murmur at the apex. RESPIRATORY:  Clear to  auscultation without rales, wheezing or rhonchi  ABDOMEN: Soft, non-tender, non-distended MUSCULOSKELETAL:  No edema; No deformity  SKIN: Warm and dry NEUROLOGIC:  Alert and oriented x 3 PSYCHIATRIC:  Normal affect   Signed, Jenean Lindau, MD  10/01/2017 2:21 PM    [facility]

## 2017-10-01 NOTE — Patient Instructions (Addendum)
Medication Instructions:  Your physician recommends that you continue on your current medications as directed. Please refer to the Current Medication list given to you today.  Labwork: Your physician recommends that you have the following labs drawn: CBC, BMP and INR  Testing/Procedures: A chest x-ray takes a picture of the organs and structures inside the chest, including the heart, lungs, and blood vessels. This test can show several things, including, whether the heart is enlarges; whether fluid is building up in the lungs; and whether pacemaker / defibrillator leads are still in place.    [facility] [place] [address] [place] [address] Dept: [phone] Loc: [place]  10/01/2017  You are scheduled for a Cardiac Catheterization on Monday, February 11 with Dr. Daneen Schick.  1. Please arrive at the [facility] (Main Entrance A) at [facility]: [address] [place], [place] [address] at 8:00 AM (two hours before your procedure to ensure your preparation). Free valet parking service is available.   Special note: Every effort is made to have your procedure done on time. Please understand that emergencies sometimes delay scheduled procedures.  2. Diet: Do not eat or drink anything after midnight prior to your procedure except sips of water to take medications.  3. Labs: Done on 02/11.  4. Medication instructions in preparation for your procedure:  Stop taking, Actoplus Met (Metformin + Pioglitazone) on Sunday, February 10.  Please be sure to hold this medication for 48 hours after the heart cath.  Do not take your lantus and victoza the morning of.  On the morning of your procedure, take your Aspirin and any morning medicines NOT listed above.  You may use sips of water.  5. Plan for one night stay--bring personal belongings. 6. Bring a current list of your  medications and current insurance cards. 7. You MUST have a responsible person to drive you home. 8. Someone MUST be with you the first 24 hours after you arrive home or your discharge will be delayed. 9. Please wear clothes that are easy to get on and off and wear slip-on shoes.  Thank you for allowing [place] to care for you!   -- [facility]   Follow-Up: Your physician recommends that you schedule a follow-up appointment in: 1 month  Any Other Special Instructions Will Be Listed Below (If Applicable).     If you need a refill on your cardiac medications before your next appointment, please call your pharmacy.   Travis Ranch, RN, BSN    Coronary Angiogram With Stent Coronary angiogram with stent placement is a procedure to widen or open a narrow blood vessel of the heart (coronary artery). Arteries may become blocked by cholesterol buildup (plaques) in the lining of the wall. When a coronary artery becomes partially blocked, blood flow to that area decreases. This may lead to chest pain or a heart attack (myocardial infarction). A stent is a small piece of metal that looks like mesh or a spring. Stent placement may be done as treatment for a heart attack or right after a coronary angiogram in which a blocked artery is found. Let your health care provider know about:  Any allergies you have.  All medicines you are taking, including vitamins, herbs, eye drops, creams, and over-the-counter medicines.  Any problems you or family members have had with anesthetic medicines.  Any blood disorders  you have.  Any surgeries you have had.  Any medical conditions you have.  Whether you are pregnant or may be pregnant. What are the risks? Generally, this is a safe procedure. However, problems may occur, including:  Damage to the heart or its blood vessels.  A return of blockage.  Bleeding, infection, or bruising at the insertion  site.  A collection of blood under the skin (hematoma) at the insertion site.  A blood clot in another part of the body.  Kidney injury.  Allergic reaction to the dye or contrast that is used.  Bleeding into the abdomen (retroperitoneal bleeding).  What happens before the procedure? Staying hydrated Follow instructions from your health care provider about hydration, which may include:  Up to 2 hours before the procedure - you may continue to drink clear liquids, such as water, clear fruit juice, black coffee, and plain tea.  Eating and drinking restrictions Follow instructions from your health care provider about eating and drinking, which may include:  8 hours before the procedure - stop eating heavy meals or foods such as meat, fried foods, or fatty foods.  6 hours before the procedure - stop eating light meals or foods, such as toast or cereal.  2 hours before the procedure - stop drinking clear liquids.  Ask your health care provider about:  Changing or stopping your regular medicines. This is especially important if you are taking diabetes medicines or blood thinners.  Taking medicines such as ibuprofen. These medicines can thin your blood. Do not take these medicines before your procedure if your health care provider instructs you not to. Generally, aspirin is recommended before a procedure of passing a small, thin tube (catheter) through a blood vessel and into the heart (cardiac catheterization).  What happens during the procedure?  An IV tube will be inserted into one of your veins.  You will be given one or more of the following: ? A medicine to help you relax (sedative). ? A medicine to numb the area where the catheter will be inserted into an artery (local anesthetic).  To reduce your risk of infection: ? Your health care team will wash or sanitize their hands. ? Your skin will be washed with soap. ? Hair may be removed from the area where the catheter will be  inserted.  Using a guide wire, the catheter will be inserted into an artery. The location may be in your groin, in your wrist, or in the fold of your arm (near your elbow).  A type of X-ray (fluoroscopy) will be used to help guide the catheter to the opening of the arteries in the heart.  A dye will be injected into the catheter, and X-rays will be taken. The dye will help to show where any narrowing or blockages are located in the arteries.  A tiny wire will be guided to the blocked spot, and a balloon will be inflated to make the artery wider.  The stent will be expanded and will crush the plaques into the wall of the vessel. The stent will hold the area open and improve the blood flow. Most stents have a drug coating to reduce the risk of the stent narrowing over time.  The artery may be made wider using a drill, laser, or other tools to remove plaques.  When the blood flow is better, the catheter will be removed. The lining of the artery will grow over the stent, which stays where it was placed. This procedure may vary  among health care providers and hospitals. What happens after the procedure?  If the procedure is done through the leg, you will be kept in bed lying flat for about 6 hours. You will be instructed to not bend and not cross your legs.  The insertion site will be checked frequently.  The pulse in your foot or wrist will be checked frequently.  You may have additional blood tests, X-rays, and a test that records the electrical activity of your heart (electrocardiogram, or ECG). This information is not intended to replace advice given to you by your health care provider. Make sure you discuss any questions you have with your health care provider. Document Released: 02/16/2003 Document Revised: 04/11/2016 Document Reviewed: 03/17/2016 Elsevier Interactive Patient Education  [employer].

## 2017-10-01 NOTE — Addendum Note (Signed)
Addended by: Carren RangGIPSON, Carson Meche on: 10/01/2017 04:06 PM   Modules accepted: Orders

## 2017-10-02 ENCOUNTER — Telehealth: Payer: Self-pay | Admitting: *Deleted

## 2017-10-02 LAB — BASIC METABOLIC PANEL
BUN / CREAT RATIO: 13 (ref 9–20)
BUN: 20 mg/dL (ref 6–24)
CALCIUM: 9.2 mg/dL (ref 8.7–10.2)
CO2: 21 mmol/L (ref 20–29)
Chloride: 102 mmol/L (ref 96–106)
Creatinine, Ser: 1.51 mg/dL — ABNORMAL HIGH (ref 0.76–1.27)
GFR calc non Af Amer: 52 mL/min/{1.73_m2} — ABNORMAL LOW (ref >59)
GFR, EST AFRICAN AMERICAN: 60 mL/min/{1.73_m2} (ref >59)
Glucose: 259 mg/dL — ABNORMAL HIGH (ref 65–99)
POTASSIUM: 4.3 mmol/L (ref 3.5–5.2)
Sodium: 141 mmol/L (ref 134–144)

## 2017-10-02 LAB — CBC WITH DIFFERENTIAL/PLATELET
BASOS: 1 %
Basophils Absolute: 0.1 10*3/uL (ref 0.0–0.2)
EOS (ABSOLUTE): 0.1 10*3/uL (ref 0.0–0.4)
Eos: 1 %
HEMOGLOBIN: 15 g/dL (ref 13.0–17.7)
Hematocrit: 44.7 % (ref 37.5–51.0)
IMMATURE GRANS (ABS): 0 10*3/uL (ref 0.0–0.1)
Immature Granulocytes: 0 %
LYMPHS: 24 %
Lymphocytes Absolute: 2.6 10*3/uL (ref 0.7–3.1)
MCH: 31 pg (ref 26.6–33.0)
MCHC: 33.6 g/dL (ref 31.5–35.7)
MCV: 92 fL (ref 79–97)
Monocytes Absolute: 1 10*3/uL — ABNORMAL HIGH (ref 0.1–0.9)
Monocytes: 10 %
NEUTROS ABS: 6.8 10*3/uL (ref 1.4–7.0)
Neutrophils: 64 %
Platelets: 282 10*3/uL (ref 150–379)
RBC: 4.84 x10E6/uL (ref 4.14–5.80)
RDW: 14.3 % (ref 12.3–15.4)
WBC: 10.5 10*3/uL (ref 3.4–10.8)

## 2017-10-02 LAB — PROTIME-INR
INR: 1 (ref 0.8–1.2)
Prothrombin Time: 10.5 s (ref 9.1–12.0)

## 2017-10-02 NOTE — Telephone Encounter (Signed)
-----   Message from Jacqlyn KraussAnne M Jaidin Ugarte, RN sent at 10/02/2017  1:36 PM EST ----- Regarding: Cath Monday Feb 11 Dr Katrinka BlazingSmith He is down for you to cath Monday 2/11-10:30 case arrive at [facility]  Dr Revankar's patient. Cr was 1.51 BUN 20 when it was checked 10/01/2017. I do not see an previous lab for comparison. He was instructed to increase his intake of water pre and post cath.  Do you have any further recommendations?  Katina DungAnne Lilliemae Fruge

## 2017-10-02 NOTE — Telephone Encounter (Signed)
Heart Catheterization scheduled at [facility] for: Monday February 11,2019 at 10:30 AM  Arrival time and place: [place] at: 8AM  HOLD medications AM of cath: Victoza AM of cath ACTOPLUS MET -none 10/05/17, 10/06/17 and 48 hours after cath Insulin AM of 1/2 Insulin night before  Except hold medications  all other AM meds can be  taken pre-cath with sip of water including: ASA 81 mg AM of cath Effient 10 mg AM of cath   Patient has responsible person to drive home post procedure and observe patient for 24 hours:  Left detailed voice mail message at [phone] [facility]) with this information for pt.

## 2017-10-02 NOTE — Telephone Encounter (Signed)
I spoke with Dr Katrinka BlazingSmith, no new recommendations pre-cath, he agreed with increase water pre and post cath.

## 2017-10-06 ENCOUNTER — Encounter (HOSPITAL_COMMUNITY)
Admission: RE | Disposition: A | Discharge status: Home / Self Care | Payer: Self-pay | Source: Ambulatory Visit | Attending: Interventional Cardiology

## 2017-10-06 ENCOUNTER — Ambulatory Visit (HOSPITAL_COMMUNITY)
Admission: RE | Admit: 2017-10-06 | Discharge: 2017-10-06 | Disposition: A | Discharge status: Home / Self Care | Payer: BLUE CROSS/BLUE SHIELD | Source: Ambulatory Visit | Attending: Interventional Cardiology | Admitting: Interventional Cardiology

## 2017-10-06 DIAGNOSIS — I2510 Atherosclerotic heart disease of native coronary artery without angina pectoris: Secondary | ICD-10-CM | POA: Diagnosis not present

## 2017-10-06 DIAGNOSIS — I10 Essential (primary) hypertension: Secondary | ICD-10-CM | POA: Diagnosis not present

## 2017-10-06 DIAGNOSIS — I2582 Chronic total occlusion of coronary artery: Secondary | ICD-10-CM | POA: Diagnosis not present

## 2017-10-06 DIAGNOSIS — Z9989 Dependence on other enabling machines and devices: Secondary | ICD-10-CM

## 2017-10-06 DIAGNOSIS — E785 Hyperlipidemia, unspecified: Secondary | ICD-10-CM | POA: Insufficient documentation

## 2017-10-06 DIAGNOSIS — E088 Diabetes mellitus due to underlying condition with unspecified complications: Secondary | ICD-10-CM | POA: Diagnosis present

## 2017-10-06 DIAGNOSIS — Z7982 Long term (current) use of aspirin: Secondary | ICD-10-CM | POA: Diagnosis not present

## 2017-10-06 DIAGNOSIS — Z87891 Personal history of nicotine dependence: Secondary | ICD-10-CM | POA: Diagnosis not present

## 2017-10-06 DIAGNOSIS — I25110 Atherosclerotic heart disease of native coronary artery with unstable angina pectoris: Secondary | ICD-10-CM | POA: Diagnosis present

## 2017-10-06 DIAGNOSIS — Z885 Allergy status to narcotic agent status: Secondary | ICD-10-CM | POA: Diagnosis not present

## 2017-10-06 DIAGNOSIS — I471 Supraventricular tachycardia: Secondary | ICD-10-CM | POA: Diagnosis not present

## 2017-10-06 DIAGNOSIS — T82855A Stenosis of coronary artery stent, initial encounter: Secondary | ICD-10-CM | POA: Diagnosis not present

## 2017-10-06 DIAGNOSIS — Z6841 Body Mass Index (BMI) 40.0 and over, adult: Secondary | ICD-10-CM | POA: Insufficient documentation

## 2017-10-06 DIAGNOSIS — R42 Dizziness and giddiness: Secondary | ICD-10-CM

## 2017-10-06 DIAGNOSIS — R0602 Shortness of breath: Secondary | ICD-10-CM

## 2017-10-06 DIAGNOSIS — E1169 Type 2 diabetes mellitus with other specified complication: Secondary | ICD-10-CM | POA: Diagnosis present

## 2017-10-06 DIAGNOSIS — G4733 Obstructive sleep apnea (adult) (pediatric): Secondary | ICD-10-CM | POA: Diagnosis not present

## 2017-10-06 DIAGNOSIS — Y831 Surgical operation with implant of artificial internal device as the cause of abnormal reaction of the patient, or of later complication, without mention of misadventure at the time of the procedure: Secondary | ICD-10-CM | POA: Insufficient documentation

## 2017-10-06 DIAGNOSIS — E118 Type 2 diabetes mellitus with unspecified complications: Secondary | ICD-10-CM | POA: Insufficient documentation

## 2017-10-06 HISTORY — PX: LEFT HEART CATH AND CORONARY ANGIOGRAPHY: CATH118249

## 2017-10-06 LAB — GLUCOSE, CAPILLARY
GLUCOSE-CAPILLARY: 93 mg/dL (ref 65–99)
GLUCOSE-CAPILLARY: 65 mg/dL (ref 65–99)
GLUCOSE-CAPILLARY: 82 mg/dL (ref 65–99)
Glucose-Capillary: 83 mg/dL (ref 65–99)
Glucose-Capillary: 74 mg/dL (ref 65–99)

## 2017-10-06 SURGERY — LEFT HEART CATH AND CORONARY ANGIOGRAPHY
Anesthesia: LOCAL

## 2017-10-06 MED ORDER — VERAPAMIL HCL 2.5 MG/ML IV SOLN
INTRAVENOUS | Status: AC
  Filled 2017-10-06: qty 2

## 2017-10-06 MED ORDER — DEXTROSE 50 % IV SOLN
INTRAVENOUS | Status: AC
  Administered 2017-10-06: 25 mL
  Filled 2017-10-06: qty 50

## 2017-10-06 MED ORDER — MIDAZOLAM HCL 2 MG/2ML IJ SOLN
INTRAMUSCULAR | Status: DC | PRN
  Administered 2017-10-06 (×2): 1 mg via INTRAVENOUS

## 2017-10-06 MED ORDER — FENTANYL CITRATE (PF) 100 MCG/2ML IJ SOLN
INTRAMUSCULAR | Status: AC
Start: 2017-10-06 — End: ?
  Filled 2017-10-06: qty 2

## 2017-10-06 MED ORDER — PRASUGREL HCL 10 MG PO TABS: 10.0000 mg | ORAL_TABLET | ORAL | Status: DC

## 2017-10-06 MED ORDER — IOPAMIDOL (ISOVUE-370) INJECTION 76%
INTRAVENOUS | Status: AC
  Filled 2017-10-06: qty 100

## 2017-10-06 MED ORDER — SODIUM CHLORIDE 0.9% FLUSH: 3.0000 mL | Freq: Two times a day (BID) | INTRAVENOUS | Status: DC

## 2017-10-06 MED ORDER — ASPIRIN 81 MG PO CHEW: 81.0000 mg | CHEWABLE_TABLET | Freq: Every day | ORAL | Status: DC

## 2017-10-06 MED ORDER — HEPARIN (PORCINE) IN NACL 2-0.9 UNIT/ML-% IJ SOLN
INTRAMUSCULAR | Status: AC | PRN
  Administered 2017-10-06: 1000 mL

## 2017-10-06 MED ORDER — SODIUM CHLORIDE 0.9% FLUSH: 3.0000 mL | INTRAVENOUS | Status: DC | PRN

## 2017-10-06 MED ORDER — IOPAMIDOL (ISOVUE-370) INJECTION 76%
INTRAVENOUS | Status: DC | PRN
  Administered 2017-10-06: 96 mL via INTRAVENOUS

## 2017-10-06 MED ORDER — ONDANSETRON HCL 4 MG/2ML IJ SOLN: 4.0000 mg | Freq: Four times a day (QID) | INTRAMUSCULAR | Status: DC | PRN

## 2017-10-06 MED ORDER — HEPARIN SODIUM (PORCINE) 1000 UNIT/ML IJ SOLN
INTRAMUSCULAR | Status: AC
  Filled 2017-10-06: qty 1

## 2017-10-06 MED ORDER — MIDAZOLAM HCL 2 MG/2ML IJ SOLN
INTRAMUSCULAR | Status: AC
  Filled 2017-10-06: qty 2

## 2017-10-06 MED ORDER — SODIUM CHLORIDE 0.9 % WEIGHT BASED INFUSION: 1.0000 mL/kg/h | INTRAVENOUS | Status: DC

## 2017-10-06 MED ORDER — LIDOCAINE HCL (PF) 1 % IJ SOLN
INTRAMUSCULAR | Status: DC | PRN
  Administered 2017-10-06: 2 mL

## 2017-10-06 MED ORDER — ACETAMINOPHEN 325 MG PO TABS: 650.0000 mg | ORAL_TABLET | ORAL | Status: DC | PRN

## 2017-10-06 MED ORDER — FENTANYL CITRATE (PF) 100 MCG/2ML IJ SOLN
INTRAMUSCULAR | Status: DC | PRN
  Administered 2017-10-06: 50 ug via INTRAVENOUS

## 2017-10-06 MED ORDER — SODIUM CHLORIDE 0.9 % IV SOLN: 250.0000 mL | INTRAVENOUS | Status: DC | PRN

## 2017-10-06 MED ORDER — LIDOCAINE HCL (PF) 1 % IJ SOLN
INTRAMUSCULAR | Status: AC
  Filled 2017-10-06: qty 30

## 2017-10-06 MED ORDER — ASPIRIN 81 MG PO CHEW: 81.0000 mg | CHEWABLE_TABLET | ORAL | Status: DC

## 2017-10-06 MED ORDER — SODIUM CHLORIDE 0.9 % IV SOLN: INTRAVENOUS | Status: DC

## 2017-10-06 MED ORDER — HEPARIN SODIUM (PORCINE) 1000 UNIT/ML IJ SOLN
INTRAMUSCULAR | Status: DC | PRN
  Administered 2017-10-06: 5000 [IU] via INTRAVENOUS

## 2017-10-06 MED ORDER — HEPARIN (PORCINE) IN NACL 2-0.9 UNIT/ML-% IJ SOLN
INTRAMUSCULAR | Status: AC
  Filled 2017-10-06: qty 1000

## 2017-10-06 MED ORDER — PRASUGREL HCL 10 MG PO TABS: 10.0000 mg | ORAL_TABLET | Freq: Every day | ORAL | Status: DC

## 2017-10-06 MED ORDER — SODIUM CHLORIDE 0.9 % IV SOLN
INTRAVENOUS | Status: AC | PRN
  Administered 2017-10-06: 1 mL/kg/h via INTRAVENOUS

## 2017-10-06 MED ORDER — SODIUM CHLORIDE 0.9 % WEIGHT BASED INFUSION
3.0000 mL/kg/h | INTRAVENOUS | Status: AC
  Administered 2017-10-06: 3 mL/kg/h via INTRAVENOUS

## 2017-10-06 MED ORDER — VERAPAMIL HCL 2.5 MG/ML IV SOLN
INTRAVENOUS | Status: DC | PRN
  Administered 2017-10-06: 10 mL via INTRA_ARTERIAL

## 2017-10-06 SURGICAL SUPPLY — 12 items
CATH 5FR JL3.5 JR4 ANG PIG MP (CATHETERS) ×2 IMPLANT
CATH INFINITI 5FR JL4 (CATHETERS) ×2 IMPLANT
CATH LAUNCHER 6FR EBU3.5 (CATHETERS) ×2 IMPLANT
COVER PRB 48X5XTLSCP FOLD TPE (BAG) ×1 IMPLANT
COVER PROBE 5X48 (BAG) ×1
DEVICE RAD COMP TR BAND LRG (VASCULAR PRODUCTS) ×2 IMPLANT
GLIDESHEATH SLEND A-KIT 6F 22G (SHEATH) ×2 IMPLANT
GUIDEWIRE INQWIRE 1.5J.035X260 (WIRE) ×1 IMPLANT
INQWIRE 1.5J .035X260CM (WIRE) ×2
KIT PREMIUM HAND CONTROLLER (KITS) ×2 IMPLANT
KIT SINGLE USE MANIFOLD (KITS) ×2 IMPLANT
PACK CARDIAC CATHETERIZATION (CUSTOM PROCEDURE TRAY) ×2 IMPLANT

## 2017-10-06 NOTE — Interval H&P Note (Signed)
Cath Lab Visit (complete for each Cath Lab visit)  Clinical Evaluation Leading to the Procedure:   ACS: No.  Non-ACS:    Anginal Classification: CCS III  Anti-ischemic medical therapy: Maximal Therapy (2 or more classes of medications)  Non-Invasive Test Results: High-risk stress test findings: cardiac mortality >3%/year  Prior CABG: No previous CABG      History and Physical Interval Note:  10/06/2017 12:23 PM  Francine Gravenoyle R Kern  has presented today for surgery, with the diagnosis of cad  The various methods of treatment have been discussed with the patient and family. After consideration of risks, benefits and other options for treatment, the patient has consented to  Procedure(s): LEFT HEART CATH AND CORONARY ANGIOGRAPHY (N/A) as a surgical intervention .  The patient's history has been reviewed, patient examined, no change in status, stable for surgery.  I have reviewed the patient's chart and labs.  Questions were answered to the patient's satisfaction.     Lyn RecordsHenry W Daila Elbert III

## 2017-10-06 NOTE — Discharge Instructions (Signed)
°  HOLD ACTOS PLUS METFORMIN FOR 48 HOURS  Radial Site Care Refer to this sheet in the next few weeks. These instructions provide you with information about caring for yourself after your procedure. Your health care provider may also give you more specific instructions. Your treatment has been planned according to current medical practices, but problems sometimes occur. Call your health care provider if you have any problems or questions after your procedure. What can I expect after the procedure? After your procedure, it is typical to have the following:  Bruising at the radial site that usually fades within 1-2 weeks.  Blood collecting in the tissue (hematoma) that may be painful to the touch. It should usually decrease in size and tenderness within 1-2 weeks.  Follow these instructions at home:  Take medicines only as directed by your health care provider.  You may shower 24-48 hours after the procedure or as directed by your health care provider. Remove the bandage (dressing) and gently wash the site with plain soap and water. Pat the area dry with a clean towel. Do not rub the site, because this may cause bleeding.  Do not take baths, swim, or use a hot tub until your health care provider approves.  Check your insertion site every day for redness, swelling, or drainage.  Do not apply powder or lotion to the site.  Do not flex or bend the affected arm for 24 hours or as directed by your health care provider.  Do not push or pull heavy objects with the affected arm for 24 hours or as directed by your health care provider.  Do not lift over 10 lb (4.5 kg) for 5 days after your procedure or as directed by your health care provider.  Ask your health care provider when it is okay to: ? Return to work or school. ? Resume usual physical activities or sports. ? Resume sexual activity.  Do not drive home if you are discharged the same day as the procedure. Have someone else drive  you.  You may drive 24 hours after the procedure unless otherwise instructed by your health care provider.  Do not operate machinery or power tools for 24 hours after the procedure.  If your procedure was done as an outpatient procedure, which means that you went home the same day as your procedure, a responsible adult should be with you for the first 24 hours after you arrive home.  Keep all follow-up visits as directed by your health care provider. This is important. Contact a health care provider if:  You have a fever.  You have chills.  You have increased bleeding from the radial site. Hold pressure on the site. Get help right away if:  You have unusual pain at the radial site.  You have redness, warmth, or swelling at the radial site.  You have drainage (other than a small amount of blood on the dressing) from the radial site.  The radial site is bleeding, and the bleeding does not stop after 30 minutes of holding steady pressure on the site.  Your arm or hand becomes pale, cool, tingly, or numb. This information is not intended to replace advice given to you by your health care provider. Make sure you discuss any questions you have with your health care provider. Document Released: 09/14/2010 Document Revised: 01/18/2016 Document Reviewed: 02/28/2014 Elsevier Interactive Patient Education  2018 [employer]

## 2017-10-06 NOTE — Progress Notes (Signed)
1201:Pt c/o feeling light-headed, "I think my blood sugar has dropped".  CBG 65, 1/2 amp D50 given, notified Brain in cath lab pt will need CBG recheck, bring pt to cath

## 2017-10-07 ENCOUNTER — Encounter (HOSPITAL_COMMUNITY): Payer: Self-pay | Admitting: Interventional Cardiology

## 2017-10-07 MED FILL — Heparin Sodium (Porcine) 2 Unit/ML in Sodium Chloride 0.9%: INTRAMUSCULAR | Qty: 1000 | Status: AC

## 2017-10-16 DIAGNOSIS — E113412 Type 2 diabetes mellitus with severe nonproliferative diabetic retinopathy with macular edema, left eye: Secondary | ICD-10-CM | POA: Diagnosis not present

## 2017-10-16 DIAGNOSIS — H524 Presbyopia: Secondary | ICD-10-CM | POA: Diagnosis not present

## 2017-10-29 ENCOUNTER — Ambulatory Visit: Payer: BLUE CROSS/BLUE SHIELD | Admitting: Cardiology

## 2017-10-29 ENCOUNTER — Encounter: Payer: Self-pay | Admitting: Cardiology

## 2017-10-29 VITALS — BP 118/76 | HR 82 | Ht 75.0 in | Wt 365.0 lb

## 2017-10-29 DIAGNOSIS — G4733 Obstructive sleep apnea (adult) (pediatric): Secondary | ICD-10-CM | POA: Diagnosis not present

## 2017-10-29 DIAGNOSIS — Z9989 Dependence on other enabling machines and devices: Secondary | ICD-10-CM | POA: Diagnosis not present

## 2017-10-29 DIAGNOSIS — I10 Essential (primary) hypertension: Secondary | ICD-10-CM

## 2017-10-29 DIAGNOSIS — E785 Hyperlipidemia, unspecified: Secondary | ICD-10-CM

## 2017-10-29 DIAGNOSIS — IMO0001 Reserved for inherently not codable concepts without codable children: Secondary | ICD-10-CM

## 2017-10-29 DIAGNOSIS — Z794 Long term (current) use of insulin: Secondary | ICD-10-CM | POA: Diagnosis not present

## 2017-10-29 DIAGNOSIS — I2510 Atherosclerotic heart disease of native coronary artery without angina pectoris: Secondary | ICD-10-CM

## 2017-10-29 DIAGNOSIS — Z6841 Body Mass Index (BMI) 40.0 and over, adult: Secondary | ICD-10-CM | POA: Diagnosis not present

## 2017-10-29 DIAGNOSIS — E1165 Type 2 diabetes mellitus with hyperglycemia: Secondary | ICD-10-CM

## 2017-10-29 DIAGNOSIS — G4730 Sleep apnea, unspecified: Secondary | ICD-10-CM | POA: Diagnosis not present

## 2017-10-29 MED ORDER — RANOLAZINE ER 500 MG PO TB12: 500.0000 mg | ORAL_TABLET | Freq: Two times a day (BID) | ORAL | 0 refills | Status: DC

## 2017-10-29 MED ORDER — RANOLAZINE ER 1000 MG PO TB12: 1000.0000 mg | ORAL_TABLET | Freq: Two times a day (BID) | ORAL | 1 refills | Status: DC

## 2017-10-29 NOTE — Patient Instructions (Signed)
Medication Instructions:  Your physician has recommended you make the following change in your medication:  START ranexa 500 mg twice daily for two weeks then change to 1,000 mg twice daily  Labwork: None  Testing/Procedures: None  Follow-Up: Your physician recommends that you schedule a follow-up appointment in: 1 month for nurse visit and return in August to see Dr. Tomie China  Any Other Special Instructions Will Be Listed Below (If Applicable).     If you need a refill on your cardiac medications before your next appointment, please call your pharmacy.   [facility]  Garey Ham, RN, BSN  Ranolazine tablets, extended release What is this medicine? RANOLAZINE (ra NOE la zeen) is a heart medicine. It is used to treat chronic chest pain (angina). This medicine must be taken regularly. It will not relieve an acute episode of chest pain. This medicine may be used for other purposes; ask your health care provider or pharmacist if you have questions. COMMON BRAND NAME(S): Ranexa What should I tell my health care provider before I take this medicine? They need to know if you have any of these conditions: -heart disease -irregular heartbeat -kidney disease -liver disease -low levels of potassium or magnesium in the blood -an unusual or allergic reaction to ranolazine, other medicines, foods, dyes, or preservatives -pregnant or trying to get pregnant -breast-feeding How should I use this medicine? Take this medicine by mouth with a glass of water. Follow the directions on the prescription label. Do not cut, crush, or chew this medicine. Take with or without food. Do not take this medication with grapefruit juice. Take your doses at regular intervals. Do not take your medicine more often then directed. Talk to your pediatrician regarding the use of this medicine in children. Special care may be needed. Overdosage: If you think you have taken too much of this medicine contact a  poison control center or emergency room at once. NOTE: This medicine is only for you. Do not share this medicine with others. What if I miss a dose? If you miss a dose, take it as soon as you can. If it is almost time for your next dose, take only that dose. Do not take double or extra doses. What may interact with this medicine? Do not take this medicine with any of the following medications: -antivirals for HIV or AIDS -cerivastatin -certain antibiotics like chloramphenicol, clarithromycin, dalfopristin; quinupristin, isoniazid, rifabutin, rifampin, rifapentine -certain medicines used for cancer like imatinib, nilotinib -certain medicines for fungal infections like fluconazole, itraconazole, ketoconazole, posaconazole, voriconazole -certain medicines for irregular heart beat like dofetilide, dronedarone -certain medicines for seizures like carbamazepine, fosphenytoin, oxcarbazepine, phenobarbital, phenytoin -cisapride -conivaptan -cyclosporine -grapefruit or grapefruit juice -lumacaftor; ivacaftor -nefazodone -pimozide -quinacrine -St John's wort -thioridazine -ziprasidone This medicine may also interact with the following medications: -alfuzosin -certain medicines for depression, anxiety, or psychotic disturbances like bupropion, citalopram, fluoxetine, fluphenazine, paroxetine, perphenazine, risperidone, sertraline, trifluoperazine -certain medicines for cholesterol like atorvastatin, lovastatin, simvastatin -certain medicines for stomach problems like octreotide, palonosetron, prochlorperazine -eplerenone -ergot alkaloids like dihydroergotamine, ergonovine, ergotamine, methylergonovine -metformin -nicardipine -other medicines that prolong the QT interval (cause an abnormal heart rhythm) -sirolimus -tacrolimus This list may not describe all possible interactions. Give your health care provider a list of all the medicines, herbs, non-prescription drugs, or dietary supplements  you use. Also tell them if you smoke, drink alcohol, or use illegal drugs. Some items may interact with your medicine. What should I watch for while using this medicine? Visit your doctor  for regular check ups. Tell your doctor or healthcare professional if your symptoms do not start to get better or if they get worse. This medicine will not relieve an acute attack of angina or chest pain. This medicine can change your heart rhythm. Your health care provider may check your heart rhythm by ordering an electrocardiogram (ECG) while you are taking this medicine. You may get drowsy or dizzy. Do not drive, use machinery, or do anything that needs mental alertness until you know how this medicine affects you. Do not stand or sit up quickly, especially if you are an older patient. This reduces the risk of dizzy or fainting spells. Alcohol may interfere with the effect of this medicine. Avoid alcoholic drinks. If you are scheduled for any medical or dental procedure, tell your healthcare provider that you are taking this medicine. This medicine can interact with other medicines used during surgery. What side effects may I notice from receiving this medicine? Side effects that you should report to your doctor or health care professional as soon as possible: -allergic reactions like skin rash, itching or hives, swelling of the face, lips, or tongue -breathing problems -changes in vision -fast, irregular or pounding heartbeat -feeling faint or lightheaded, falls -low or high blood pressure -numbness or tingling feelings -ringing in the ears -tremor or shakiness -slow heartbeat (fewer than 50 beats per minute) -swelling of the legs or feet Side effects that usually do not require medical attention (report to your doctor or health care professional if they continue or are bothersome): -constipation -drowsy -dry mouth -headache -nausea or vomiting -stomach upset This list may not describe all possible  side effects. Call your doctor for medical advice about side effects. You may report side effects to FDA at 1-800-FDA-1088. Where should I keep my medicine? Keep out of the reach of children. Store at room temperature between 15 and 30 degrees C (59 and 86 degrees F). Throw away any unused medicine after the expiration date. NOTE: This sheet is a summary. It may not cover all possible information. If you have questions about this medicine, talk to your doctor, pharmacist, or health care provider.  2018 Elsevier/Gold Standard (2015-09-14 12:24:15)

## 2017-10-30 NOTE — Progress Notes (Signed)
Cardiology Office Note:    Date:  10/30/2017   ID:  Kevin Lawrence, DOB [birth year], MRN [id number]  PCP:  Helen Hashimoto., MD  Cardiologist:  Jenean Lindau, MD   Referring MD: Helen Hashimoto., MD    ASSESSMENT:    1. CAD in native artery   2. Essential hypertension   3. OSA on CPAP   4. Uncontrolled type 2 diabetes mellitus without complication, with long-term current use of insulin ([place])   5. Dyslipidemia   6. Morbid obesity with BMI of 45.0-49.9, adult ([place])    PLAN:    In order of problems listed above:  1. Secondary prevention stressed with the patient.  Importance of compliance with diet and medications stressed and he vocalized understanding.  His blood pressure stable.  Diet was discussed with dyslipidemia and diabetes mellitus.  Risks of obesity explained and he vocalized understanding.  He plans to work on this very aggressively to reduce weight. 2. Coronary angiography report was discussed with him at length questions were answered to his satisfaction.  In view of this I have asked him to exercise and walk regularly and lose weight and get a better control of the diabetes.  I have also initiated him on Ranexa 500 mg twice a day for 2 weeks and then 1000 mg twice daily.  He will be back in 3-4 weeks for a follow-up EKG. 3. Follow-up appointment in 6 months or earlier if he has any concerns.   Medication Adjustments/Labs and Tests Ordered: Current medicines are reviewed at length with the patient today.  Concerns regarding medicines are outlined above.  No orders of the defined types were placed in this encounter.  Meds ordered this encounter  Medications  . ranolazine (RANEXA) 1000 MG SR tablet    Sig: Take 1 tablet (1,000 mg total) by mouth 2 (two) times daily.    Dispense:  180 tablet    Refill:  1  . ranolazine (RANEXA) 500 MG 12 hr tablet    Sig: Take 1 tablet (500 mg total) by mouth 2 (two) times daily.    Dispense:  28 tablet    Refill:  0      Chief Complaint  Patient presents with  . Follow-up  . Shortness of Breath     History of Present Illness:    Kevin Lawrence is a 52 y.o. male.  The patient has history of coronary artery disease, essential hypertension, dyslipidemia and diabetes mellitus.  He is morbidly obese.  He denies any problems at this time and takes care of activities of daily living.  No chest pain orthopnea or PND.  He leads a sedentary lifestyle and does not exercise on a regular basis.  History reviewed. No pertinent past medical history.  Past Surgical History:  Procedure Laterality Date  . LEFT HEART CATH AND CORONARY ANGIOGRAPHY N/A 10/06/2017   Procedure: LEFT HEART CATH AND CORONARY ANGIOGRAPHY;  Surgeon: Belva Crome, MD;  Location: [place] [facility];  Service: Cardiovascular;  Laterality: N/A;    Current Medications: Current Meds  Medication Sig  . amLODipine (NORVASC) 10 MG tablet Take 10 mg by mouth daily.   Marland Kitchen aspirin EC 81 MG tablet Take 81 mg by mouth daily.   Marland Kitchen BAYER CONTOUR TEST test strip   . budesonide-formoterol (SYMBICORT) 160-4.5 MCG/ACT inhaler Inhale 2 puffs into the lungs daily.   . carvedilol (COREG) 25 MG tablet Take 25 mg by mouth 2 (two) times daily.  . colesevelam (  WELCHOL) 625 MG tablet Take 1,875 mg by mouth 2 (two) times daily with a meal.   . famotidine (PEPCID) 20 MG tablet Take 20 mg by mouth daily.  . hydrALAZINE (APRESOLINE) 25 MG tablet Take 25 mg by mouth 4 (four) times daily.  Marland Kitchen liraglutide (VICTOZA) 18 MG/3ML SOPN Inject 18 mg into the skin daily.   Marland Kitchen losartan (COZAAR) 100 MG tablet Take 100 mg by mouth daily.   . nitroGLYCERIN (NITROSTAT) 0.4 MG SL tablet Place 1 tablet (0.4 mg total) under the tongue every 5 (five) minutes as needed for chest pain.  . pioglitazone-metformin (ACTOPLUS MET) 15-850 MG tablet Take 1 tablet by mouth 2 (two) times daily with a meal.   . prasugrel (EFFIENT) 10 MG TABS tablet TAKE 1 TABLET (10 MG TOTAL) BY MOUTH DAILY.  .  rosuvastatin (CRESTOR) 40 MG tablet Take 40 mg by mouth daily.  Nelva Nay SOLOSTAR 300 UNIT/ML SOPN INJECT 80 UNITS DAILY     Allergies:   Codeine and Hydrocodone-acetaminophen   Social History   Socioeconomic History  . Marital status: Single    Spouse name: None  . Number of children: None  . Years of education: None  . Highest education level: None  Social Needs  . Financial resource strain: None  . Food insecurity - worry: None  . Food insecurity - inability: None  . Transportation needs - medical: None  . Transportation needs - non-medical: None  Occupational History  . None  Tobacco Use  . Smoking status: Former Research scientist (life sciences)  . Smokeless tobacco: Former Network engineer and Sexual Activity  . Alcohol use: None  . Drug use: None  . Sexual activity: None  Other Topics Concern  . None  Social History Narrative  . None     Family History: The patient's family history includes Cancer in his mother; Heart disease in his father; Stroke in his mother.  ROS:   Please see the history of present illness.    All other systems reviewed and are negative.  EKGs/Labs/Other Studies Reviewed:    The following studies were reviewed today: Coronary angiography report was discussed with the patient at length and questions were answered to his satisfaction.   Recent Labs: 10/01/2017: BUN 20; Creatinine, Ser 1.51; Hemoglobin 15.0; Platelets 282; Potassium 4.3; Sodium 141  Recent Lipid Panel No results found for: CHOL, TRIG, HDL, CHOLHDL, VLDL, LDLCALC, LDLDIRECT  Physical Exam:    VS:  BP 118/76 (BP Location: Left Arm, Patient Position: Sitting, Cuff Size: Normal)   Pulse 82   Ht '6\' 3"'  (1.905 m)   Wt (!) 365 lb (165.6 kg)   SpO2 98%   BMI 45.62 kg/m     Wt Readings from Last 3 Encounters:  10/29/17 (!) 365 lb (165.6 kg)  10/06/17 (!) 362 lb (164.2 kg)  10/01/17 (!) 361 lb 6.4 oz (163.9 kg)     GEN: Patient is in no acute distress HEENT: Normal NECK: No JVD; No carotid  bruits LYMPHATICS: No lymphadenopathy CARDIAC: Hear sounds regular, 2/6 systolic murmur at the apex. RESPIRATORY:  Clear to auscultation without rales, wheezing or rhonchi  ABDOMEN: Soft, non-tender, non-distended MUSCULOSKELETAL:  No edema; No deformity  SKIN: Warm and dry NEUROLOGIC:  Alert and oriented x 3 PSYCHIATRIC:  Normal affect   Signed, Jenean Lindau, MD  10/30/2017 9:13 AM    [place]

## 2017-11-03 DIAGNOSIS — E113312 Type 2 diabetes mellitus with moderate nonproliferative diabetic retinopathy with macular edema, left eye: Secondary | ICD-10-CM | POA: Diagnosis not present

## 2017-11-06 ENCOUNTER — Telehealth: Payer: Self-pay | Admitting: Cardiology

## 2017-11-06 ENCOUNTER — Other Ambulatory Visit: Payer: Self-pay

## 2017-11-06 MED ORDER — PRASUGREL HCL 10 MG PO TABS: 10.0000 mg | ORAL_TABLET | Freq: Every day | ORAL | 3 refills | Status: DC

## 2017-11-06 NOTE — Telephone Encounter (Signed)
°*  STAT* If patient is at the pharmacy, call can be transferred to refill team.   1. Which medications need to be refilled? (please list name of each medication and dose if known) Clopidigrel   2. Which pharmacy/location (including street and city if local pharmacy) is medication to be sent to? [facility]  3. Do they need a 30 day or 90 day supply? 90

## 2017-11-06 NOTE — Telephone Encounter (Signed)
Refill for effient sent in. Clarified with the patient.

## 2017-11-26 DIAGNOSIS — Z1211 Encounter for screening for malignant neoplasm of colon: Secondary | ICD-10-CM | POA: Diagnosis not present

## 2017-11-26 DIAGNOSIS — Z1212 Encounter for screening for malignant neoplasm of rectum: Secondary | ICD-10-CM | POA: Diagnosis not present

## 2017-12-01 ENCOUNTER — Ambulatory Visit: Payer: BLUE CROSS/BLUE SHIELD

## 2018-04-17 DIAGNOSIS — Z6841 Body Mass Index (BMI) 40.0 and over, adult: Secondary | ICD-10-CM | POA: Diagnosis not present

## 2018-04-17 DIAGNOSIS — G4739 Other sleep apnea: Secondary | ICD-10-CM | POA: Diagnosis not present

## 2018-05-07 DIAGNOSIS — L03031 Cellulitis of right toe: Secondary | ICD-10-CM | POA: Diagnosis not present

## 2018-05-07 DIAGNOSIS — L03032 Cellulitis of left toe: Secondary | ICD-10-CM | POA: Diagnosis not present

## 2018-05-07 DIAGNOSIS — E1165 Type 2 diabetes mellitus with hyperglycemia: Secondary | ICD-10-CM | POA: Diagnosis not present

## 2018-05-07 DIAGNOSIS — L600 Ingrowing nail: Secondary | ICD-10-CM | POA: Diagnosis not present

## 2018-05-19 DIAGNOSIS — L03032 Cellulitis of left toe: Secondary | ICD-10-CM | POA: Diagnosis not present

## 2018-05-19 DIAGNOSIS — Z794 Long term (current) use of insulin: Secondary | ICD-10-CM | POA: Diagnosis not present

## 2018-05-19 DIAGNOSIS — L03031 Cellulitis of right toe: Secondary | ICD-10-CM | POA: Diagnosis not present

## 2018-05-19 DIAGNOSIS — E1165 Type 2 diabetes mellitus with hyperglycemia: Secondary | ICD-10-CM | POA: Diagnosis not present

## 2018-06-03 ENCOUNTER — Other Ambulatory Visit: Payer: Self-pay

## 2018-06-03 MED ORDER — RANOLAZINE ER 1000 MG PO TB12: 1000.0000 mg | ORAL_TABLET | Freq: Two times a day (BID) | ORAL | 0 refills | Status: DC

## 2018-07-17 DIAGNOSIS — E1165 Type 2 diabetes mellitus with hyperglycemia: Secondary | ICD-10-CM | POA: Diagnosis not present

## 2018-07-17 DIAGNOSIS — L600 Ingrowing nail: Secondary | ICD-10-CM | POA: Diagnosis not present

## 2018-07-17 DIAGNOSIS — Z794 Long term (current) use of insulin: Secondary | ICD-10-CM | POA: Diagnosis not present

## 2018-07-28 DIAGNOSIS — E1165 Type 2 diabetes mellitus with hyperglycemia: Secondary | ICD-10-CM | POA: Diagnosis not present

## 2018-07-28 DIAGNOSIS — L600 Ingrowing nail: Secondary | ICD-10-CM

## 2018-07-28 DIAGNOSIS — Z794 Long term (current) use of insulin: Secondary | ICD-10-CM | POA: Diagnosis not present

## 2018-07-28 DIAGNOSIS — M722 Plantar fascial fibromatosis: Secondary | ICD-10-CM | POA: Diagnosis not present

## 2018-07-28 HISTORY — DX: Ingrowing nail: L60.0

## 2018-08-25 ENCOUNTER — Other Ambulatory Visit: Payer: Self-pay | Admitting: *Deleted

## 2018-08-25 MED ORDER — RANOLAZINE ER 1000 MG PO TB12: 1000.0000 mg | ORAL_TABLET | Freq: Two times a day (BID) | ORAL | 1 refills | Status: DC

## 2018-08-28 ENCOUNTER — Ambulatory Visit: Payer: BLUE CROSS/BLUE SHIELD | Admitting: Cardiology

## 2018-09-02 DIAGNOSIS — I10 Essential (primary) hypertension: Secondary | ICD-10-CM | POA: Diagnosis not present

## 2018-09-02 DIAGNOSIS — E1165 Type 2 diabetes mellitus with hyperglycemia: Secondary | ICD-10-CM | POA: Diagnosis not present

## 2018-09-02 DIAGNOSIS — G4730 Sleep apnea, unspecified: Secondary | ICD-10-CM | POA: Diagnosis not present

## 2018-09-02 DIAGNOSIS — Z1331 Encounter for screening for depression: Secondary | ICD-10-CM | POA: Diagnosis not present

## 2018-09-02 DIAGNOSIS — I2510 Atherosclerotic heart disease of native coronary artery without angina pectoris: Secondary | ICD-10-CM | POA: Diagnosis not present

## 2018-09-04 DIAGNOSIS — N50812 Left testicular pain: Secondary | ICD-10-CM | POA: Diagnosis not present

## 2018-09-04 DIAGNOSIS — N433 Hydrocele, unspecified: Secondary | ICD-10-CM | POA: Diagnosis not present

## 2018-09-16 DIAGNOSIS — N451 Epididymitis: Secondary | ICD-10-CM | POA: Diagnosis not present

## 2018-09-16 DIAGNOSIS — N401 Enlarged prostate with lower urinary tract symptoms: Secondary | ICD-10-CM | POA: Diagnosis not present

## 2018-09-25 ENCOUNTER — Ambulatory Visit: Payer: BLUE CROSS/BLUE SHIELD | Admitting: Cardiology

## 2018-09-25 ENCOUNTER — Encounter: Payer: Self-pay | Admitting: Cardiology

## 2018-09-25 VITALS — BP 120/72 | HR 78 | Ht 75.0 in | Wt 373.0 lb

## 2018-09-25 DIAGNOSIS — I10 Essential (primary) hypertension: Secondary | ICD-10-CM | POA: Diagnosis not present

## 2018-09-25 DIAGNOSIS — G4733 Obstructive sleep apnea (adult) (pediatric): Secondary | ICD-10-CM

## 2018-09-25 DIAGNOSIS — Z794 Long term (current) use of insulin: Secondary | ICD-10-CM

## 2018-09-25 DIAGNOSIS — E1165 Type 2 diabetes mellitus with hyperglycemia: Secondary | ICD-10-CM

## 2018-09-25 DIAGNOSIS — I2510 Atherosclerotic heart disease of native coronary artery without angina pectoris: Secondary | ICD-10-CM

## 2018-09-25 DIAGNOSIS — IMO0001 Reserved for inherently not codable concepts without codable children: Secondary | ICD-10-CM

## 2018-09-25 DIAGNOSIS — E785 Hyperlipidemia, unspecified: Secondary | ICD-10-CM | POA: Diagnosis not present

## 2018-09-25 DIAGNOSIS — Z6841 Body Mass Index (BMI) 40.0 and over, adult: Secondary | ICD-10-CM

## 2018-09-25 DIAGNOSIS — Z9989 Dependence on other enabling machines and devices: Secondary | ICD-10-CM

## 2018-09-25 NOTE — Progress Notes (Signed)
Cardiology Office Note:    Date:  09/25/2018   ID:  Kevin Lawrence, DOB 03/30/1966, MRN [id number]  PCP:  Helen Hashimoto., MD  Cardiologist:  Jenean Lindau, MD   Referring MD: Helen Hashimoto., MD    ASSESSMENT:    1. CAD in native artery   2. Essential hypertension   3. Dyslipidemia   4. Morbid obesity with BMI of 45.0-49.9, adult ([place])   5. Uncontrolled type 2 diabetes mellitus without complication, with long-term current use of insulin ([place])   6. OSA on CPAP    PLAN:    In order of problems listed above:  1. Secondary prevention stressed with the patient.  Importance of compliance with diet and medication stressed and he vocalized understanding.  His blood pressure is stable.  Diet was discussed for dyslipidemia and diabetes mellitus.  I had lipid numbers and I told him on a stricter diet and his doctor is going to check it in the next 2 to 3 months.  Reduction of weight was stressed and diet was discussed and risks of obesity explained and he vocalized understanding 2. I made sure that we outlined an exercise program.  Also make sure he has a nitroglycerin prescription.Patient will be seen in follow-up appointment in 6 months or earlier if the patient has any concerns   Medication Adjustments/Labs and Tests Ordered: Current medicines are reviewed at length with the patient today.  Concerns regarding medicines are outlined above.  No orders of the defined types were placed in this encounter.  No orders of the defined types were placed in this encounter.    No chief complaint on file.    History of Present Illness:    Kevin Lawrence is a 53 y.o. male.  Patient has known coronary artery disease, essential hypertension, dyslipidemia, diabetes mellitus and morbid obesity.  He mentions to me that he is trying to get into an exercise plan.  No chest pain orthopnea or PND.  He is here for routine follow-up.  At the time of my evaluation, the patient is alert awake  oriented and in no distress.  History reviewed. No pertinent past medical history.  Past Surgical History:  Procedure Laterality Date  . LEFT HEART CATH AND CORONARY ANGIOGRAPHY N/A 10/06/2017   Procedure: LEFT HEART CATH AND CORONARY ANGIOGRAPHY;  Surgeon: Belva Crome, MD;  Location: [place] [facility];  Service: Cardiovascular;  Laterality: N/A;    Current Medications: Current Meds  Medication Sig  . amLODipine (NORVASC) 10 MG tablet Take 10 mg by mouth daily.   Marland Kitchen aspirin EC 81 MG tablet Take 81 mg by mouth daily.   Marland Kitchen BAYER CONTOUR TEST test strip   . budesonide-formoterol (SYMBICORT) 160-4.5 MCG/ACT inhaler Inhale 2 puffs into the lungs daily.   . carvedilol (COREG) 25 MG tablet Take 25 mg by mouth 2 (two) times daily.  . colesevelam (WELCHOL) 625 MG tablet Take 1,875 mg by mouth 2 (two) times daily with a meal.   . famotidine (PEPCID) 20 MG tablet Take 20 mg by mouth daily.  . hydrALAZINE (APRESOLINE) 25 MG tablet Take 25 mg by mouth 4 (four) times daily.  Marland Kitchen liraglutide (VICTOZA) 18 MG/3ML SOPN Inject 18 mg into the skin daily.   Marland Kitchen losartan (COZAAR) 100 MG tablet Take 100 mg by mouth daily.   . nitroGLYCERIN (NITROSTAT) 0.4 MG SL tablet Place 1 tablet (0.4 mg total) under the tongue every 5 (five) minutes as needed for chest pain.  Marland Kitchen  pioglitazone-metformin (ACTOPLUS MET) 15-850 MG tablet Take 1 tablet by mouth 2 (two) times daily with a meal.   . prasugrel (EFFIENT) 10 MG TABS tablet Take 1 tablet (10 mg total) by mouth daily.  . ranolazine (RANEXA) 1000 MG SR tablet Take 1 tablet (1,000 mg total) by mouth 2 (two) times daily. Patient needs appointment for further refills  . rosuvastatin (CRESTOR) 40 MG tablet Take 40 mg by mouth daily.  . tamsulosin (FLOMAX) 0.4 MG CAPS capsule Take 0.4 mg by mouth daily.  Nelva Nay SOLOSTAR 300 UNIT/ML SOPN INJECT 80 UNITS DAILY     Allergies:   Codeine and Hydrocodone-acetaminophen   Social History   Socioeconomic History  . Marital  status: Single    Spouse name: Not on file  . Number of children: Not on file  . Years of education: Not on file  . Highest education level: Not on file  Occupational History  . Not on file  Social Needs  . Financial resource strain: Not on file  . Food insecurity:    Worry: Not on file    Inability: Not on file  . Transportation needs:    Medical: Not on file    Non-medical: Not on file  Tobacco Use  . Smoking status: Former Research scientist (life sciences)  . Smokeless tobacco: Former Network engineer and Sexual Activity  . Alcohol use: Not on file  . Drug use: Not on file  . Sexual activity: Not on file  Lifestyle  . Physical activity:    Days per week: Not on file    Minutes per session: Not on file  . Stress: Not on file  Relationships  . Social connections:    Talks on phone: Not on file    Gets together: Not on file    Attends religious service: Not on file    Active member of club or organization: Not on file    Attends meetings of clubs or organizations: Not on file    Relationship status: Not on file  Other Topics Concern  . Not on file  Social History Narrative  . Not on file     Family History: The patient's family history includes Cancer in his mother; Heart disease in his father; Stroke in his mother.  ROS:   Please see the history of present illness.    All other systems reviewed and are negative.  EKGs/Labs/Other Studies Reviewed:    The following studies were reviewed today: I discussed my findings with the patient at extensive length.   Recent Labs: 10/01/2017: BUN 20; Creatinine, Ser 1.51; Hemoglobin 15.0; Platelets 282; Potassium 4.3; Sodium 141  Recent Lipid Panel No results found for: CHOL, TRIG, HDL, CHOLHDL, VLDL, LDLCALC, LDLDIRECT  Physical Exam:    VS:  BP 120/72 (BP Location: Right Arm, Patient Position: Sitting, Cuff Size: Normal)   Pulse 78   Ht '6\' 3"'  (1.905 m)   Wt (!) 373 lb (169.2 kg)   SpO2 98%   BMI 46.62 kg/m     Wt Readings from Last 3  Encounters:  09/25/18 (!) 373 lb (169.2 kg)  10/29/17 (!) 365 lb (165.6 kg)  10/06/17 (!) 362 lb (164.2 kg)     GEN: Patient is in no acute distress HEENT: Normal NECK: No JVD; No carotid bruits LYMPHATICS: No lymphadenopathy CARDIAC: Hear sounds regular, 2/6 systolic murmur at the apex. RESPIRATORY:  Clear to auscultation without rales, wheezing or rhonchi  ABDOMEN: Soft, non-tender, non-distended MUSCULOSKELETAL:  No edema; No deformity  SKIN:  Warm and dry NEUROLOGIC:  Alert and oriented x 3 PSYCHIATRIC:  Normal affect   Signed, Jenean Lindau, MD  09/25/2018 9:53 AM    [facility]

## 2018-09-25 NOTE — Patient Instructions (Signed)
Medication Instructions:  Your physician recommends that you continue on your current medications as directed. Please refer to the Current Medication list given to you today.  If you need a refill on your cardiac medications before your next appointment, please call your pharmacy.   Lab work: None,  If you have labs (blood work) drawn today and your tests are completely normal, you will receive your results only by: Marland Kitchen MyChart Message (if you have MyChart) OR . A paper copy in the mail If you have any lab test that is abnormal or we need to change your treatment, we will call you to review the results.  Testing/Procedures: None,   Follow-Up: At [facility], you and your health needs are our priority.  As part of our continuing mission to provide you with exceptional heart care, we have created designated Provider Care Teams.  These Care Teams include your primary Cardiologist (physician) and Advanced Practice Providers (APPs -  Physician Assistants and Nurse Practitioners) who all work together to provide you with the care you need, when you need it. You will need a follow up appointment in 6 months.  Please call our office 2 months in advance to schedule this appointment.  You may see No primary care provider on file. or another member of our [employer] Provider Team in [place]: Gypsy Balsam, MD . Norman Herrlich, MD  Any Other Special Instructions Will Be Listed Below (If Applicable).

## 2018-10-09 ENCOUNTER — Other Ambulatory Visit: Payer: Self-pay

## 2018-10-09 DIAGNOSIS — I2510 Atherosclerotic heart disease of native coronary artery without angina pectoris: Secondary | ICD-10-CM

## 2018-10-09 DIAGNOSIS — I10 Essential (primary) hypertension: Secondary | ICD-10-CM

## 2018-10-09 DIAGNOSIS — I471 Supraventricular tachycardia: Secondary | ICD-10-CM

## 2018-10-09 MED ORDER — PRASUGREL HCL 10 MG PO TABS: 10.0000 mg | ORAL_TABLET | Freq: Every day | ORAL | 3 refills | Status: DC

## 2018-10-20 DIAGNOSIS — N401 Enlarged prostate with lower urinary tract symptoms: Secondary | ICD-10-CM | POA: Diagnosis not present

## 2018-10-20 DIAGNOSIS — N451 Epididymitis: Secondary | ICD-10-CM | POA: Diagnosis not present

## 2018-11-06 ENCOUNTER — Other Ambulatory Visit: Payer: Self-pay | Admitting: Cardiology

## 2018-11-18 DIAGNOSIS — Z125 Encounter for screening for malignant neoplasm of prostate: Secondary | ICD-10-CM | POA: Diagnosis not present

## 2018-11-18 DIAGNOSIS — N451 Epididymitis: Secondary | ICD-10-CM | POA: Diagnosis not present

## 2018-11-18 DIAGNOSIS — N401 Enlarged prostate with lower urinary tract symptoms: Secondary | ICD-10-CM | POA: Diagnosis not present

## 2018-11-18 DIAGNOSIS — R81 Glycosuria: Secondary | ICD-10-CM | POA: Diagnosis not present

## 2019-01-05 DIAGNOSIS — E1165 Type 2 diabetes mellitus with hyperglycemia: Secondary | ICD-10-CM | POA: Diagnosis not present

## 2019-01-05 DIAGNOSIS — I10 Essential (primary) hypertension: Secondary | ICD-10-CM | POA: Diagnosis not present

## 2019-01-05 DIAGNOSIS — G4730 Sleep apnea, unspecified: Secondary | ICD-10-CM | POA: Diagnosis not present

## 2019-01-05 DIAGNOSIS — I2510 Atherosclerotic heart disease of native coronary artery without angina pectoris: Secondary | ICD-10-CM | POA: Diagnosis not present

## 2019-01-07 DIAGNOSIS — L570 Actinic keratosis: Secondary | ICD-10-CM | POA: Diagnosis not present

## 2019-01-07 DIAGNOSIS — L918 Other hypertrophic disorders of the skin: Secondary | ICD-10-CM | POA: Diagnosis not present

## 2019-01-07 DIAGNOSIS — L820 Inflamed seborrheic keratosis: Secondary | ICD-10-CM | POA: Diagnosis not present

## 2019-01-07 DIAGNOSIS — L219 Seborrheic dermatitis, unspecified: Secondary | ICD-10-CM | POA: Diagnosis not present

## 2019-01-07 DIAGNOSIS — D1801 Hemangioma of skin and subcutaneous tissue: Secondary | ICD-10-CM | POA: Diagnosis not present

## 2019-01-07 DIAGNOSIS — C44529 Squamous cell carcinoma of skin of other part of trunk: Secondary | ICD-10-CM | POA: Diagnosis not present

## 2019-01-22 DIAGNOSIS — C44519 Basal cell carcinoma of skin of other part of trunk: Secondary | ICD-10-CM | POA: Diagnosis not present

## 2019-03-30 ENCOUNTER — Other Ambulatory Visit: Payer: Self-pay | Admitting: Cardiology

## 2019-05-13 DIAGNOSIS — L84 Corns and callosities: Secondary | ICD-10-CM | POA: Insufficient documentation

## 2019-05-13 HISTORY — DX: Corns and callosities: L84

## 2019-06-28 ENCOUNTER — Other Ambulatory Visit: Payer: Self-pay | Admitting: Cardiology

## 2019-10-20 ENCOUNTER — Other Ambulatory Visit: Payer: Self-pay | Admitting: Cardiology

## 2019-10-20 DIAGNOSIS — I471 Supraventricular tachycardia: Secondary | ICD-10-CM

## 2019-10-20 DIAGNOSIS — I10 Essential (primary) hypertension: Secondary | ICD-10-CM

## 2019-10-20 DIAGNOSIS — I2510 Atherosclerotic heart disease of native coronary artery without angina pectoris: Secondary | ICD-10-CM

## 2019-11-15 ENCOUNTER — Other Ambulatory Visit: Payer: Self-pay | Admitting: Cardiology

## 2019-11-15 DIAGNOSIS — I10 Essential (primary) hypertension: Secondary | ICD-10-CM

## 2019-11-15 DIAGNOSIS — I2510 Atherosclerotic heart disease of native coronary artery without angina pectoris: Secondary | ICD-10-CM

## 2019-11-15 DIAGNOSIS — I471 Supraventricular tachycardia: Secondary | ICD-10-CM

## 2019-11-24 IMAGING — CR DG CHEST 2V
2 series · 2 of 2 positions shown · non-contrast
Comparison: Chest x-ray and CT chest dated January 04, 2017.

CLINICAL DATA: Preoperative study for cardiac catheterization.
Dyspnea on exertion.

EXAM:
CHEST  2 VIEW

[w chest pa *]
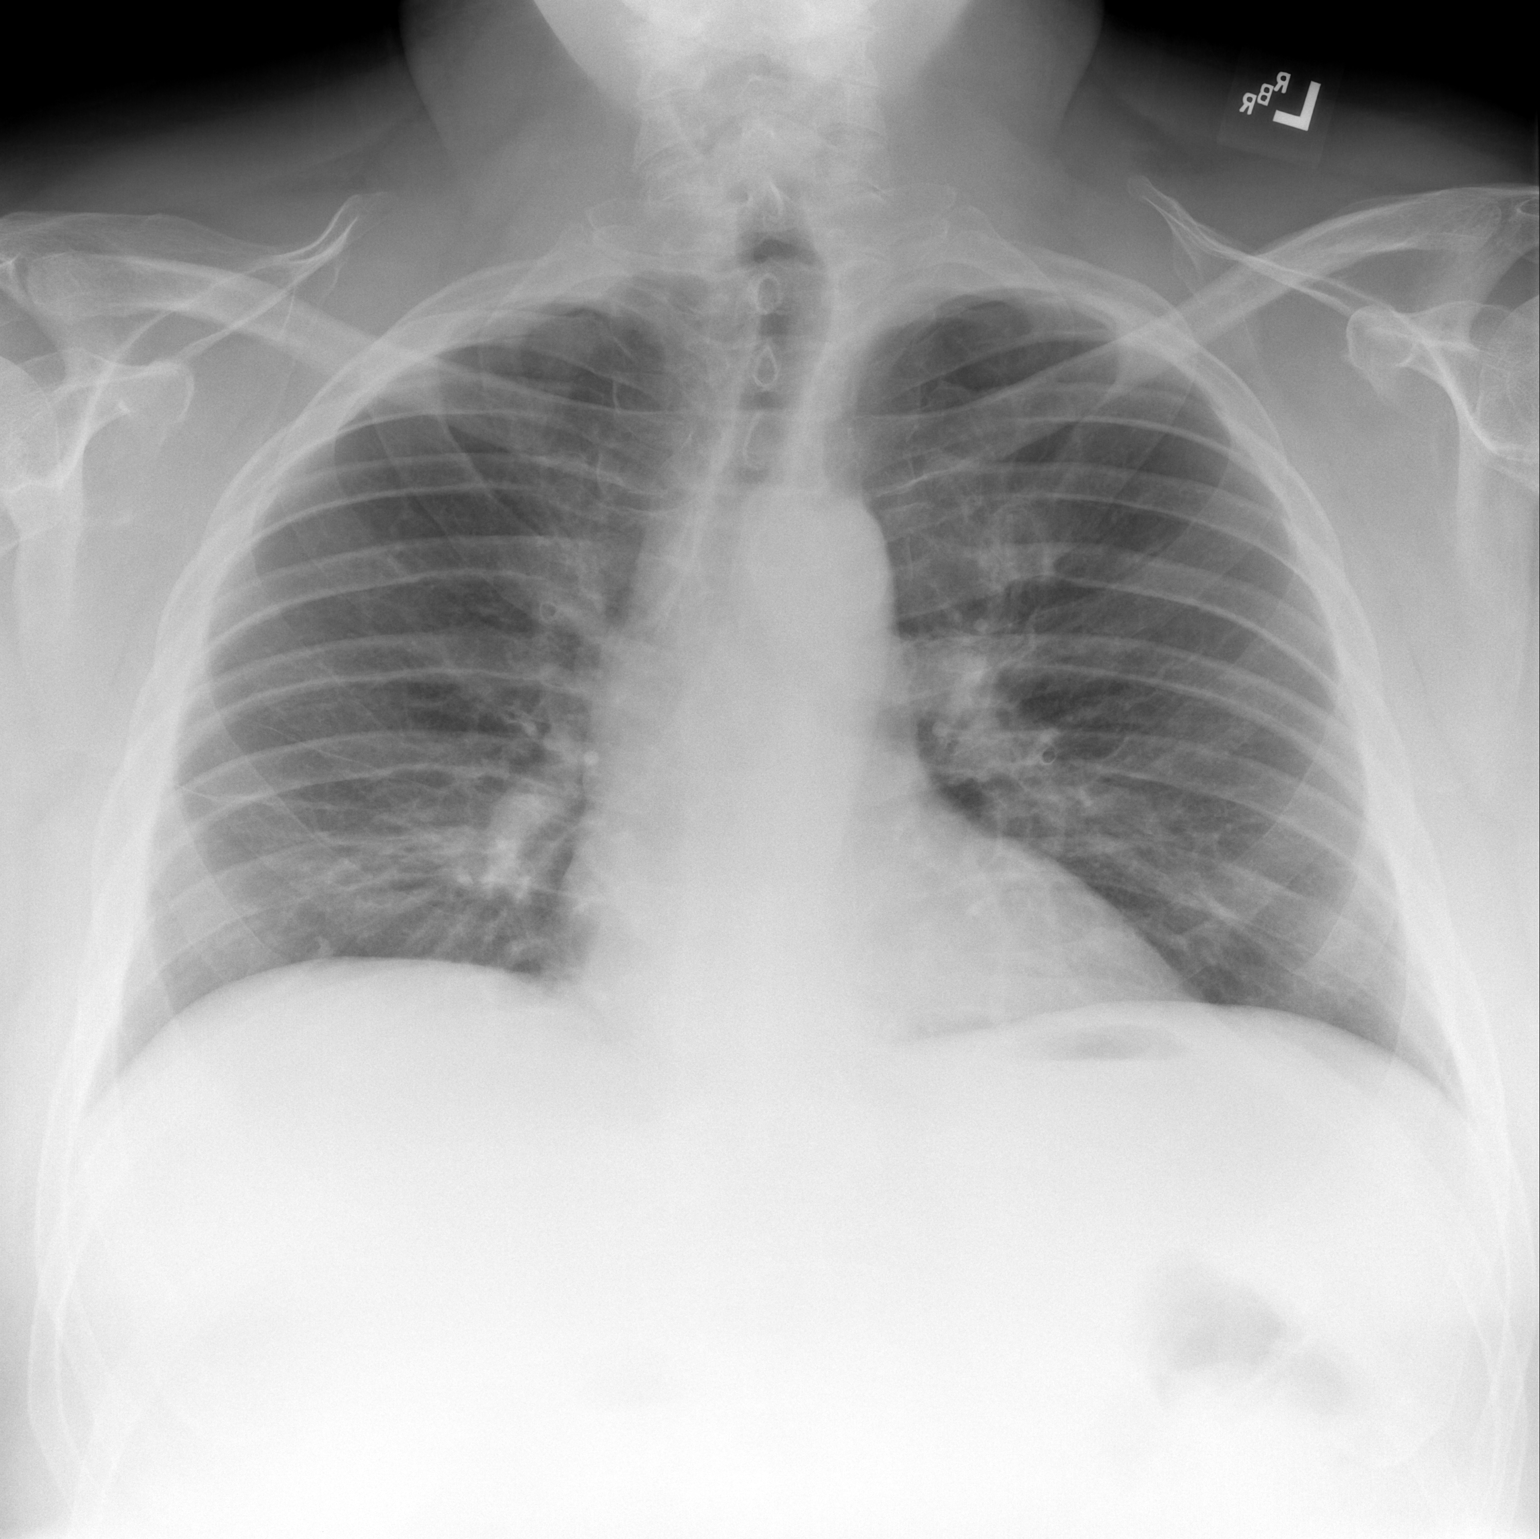

[w chest lat *]
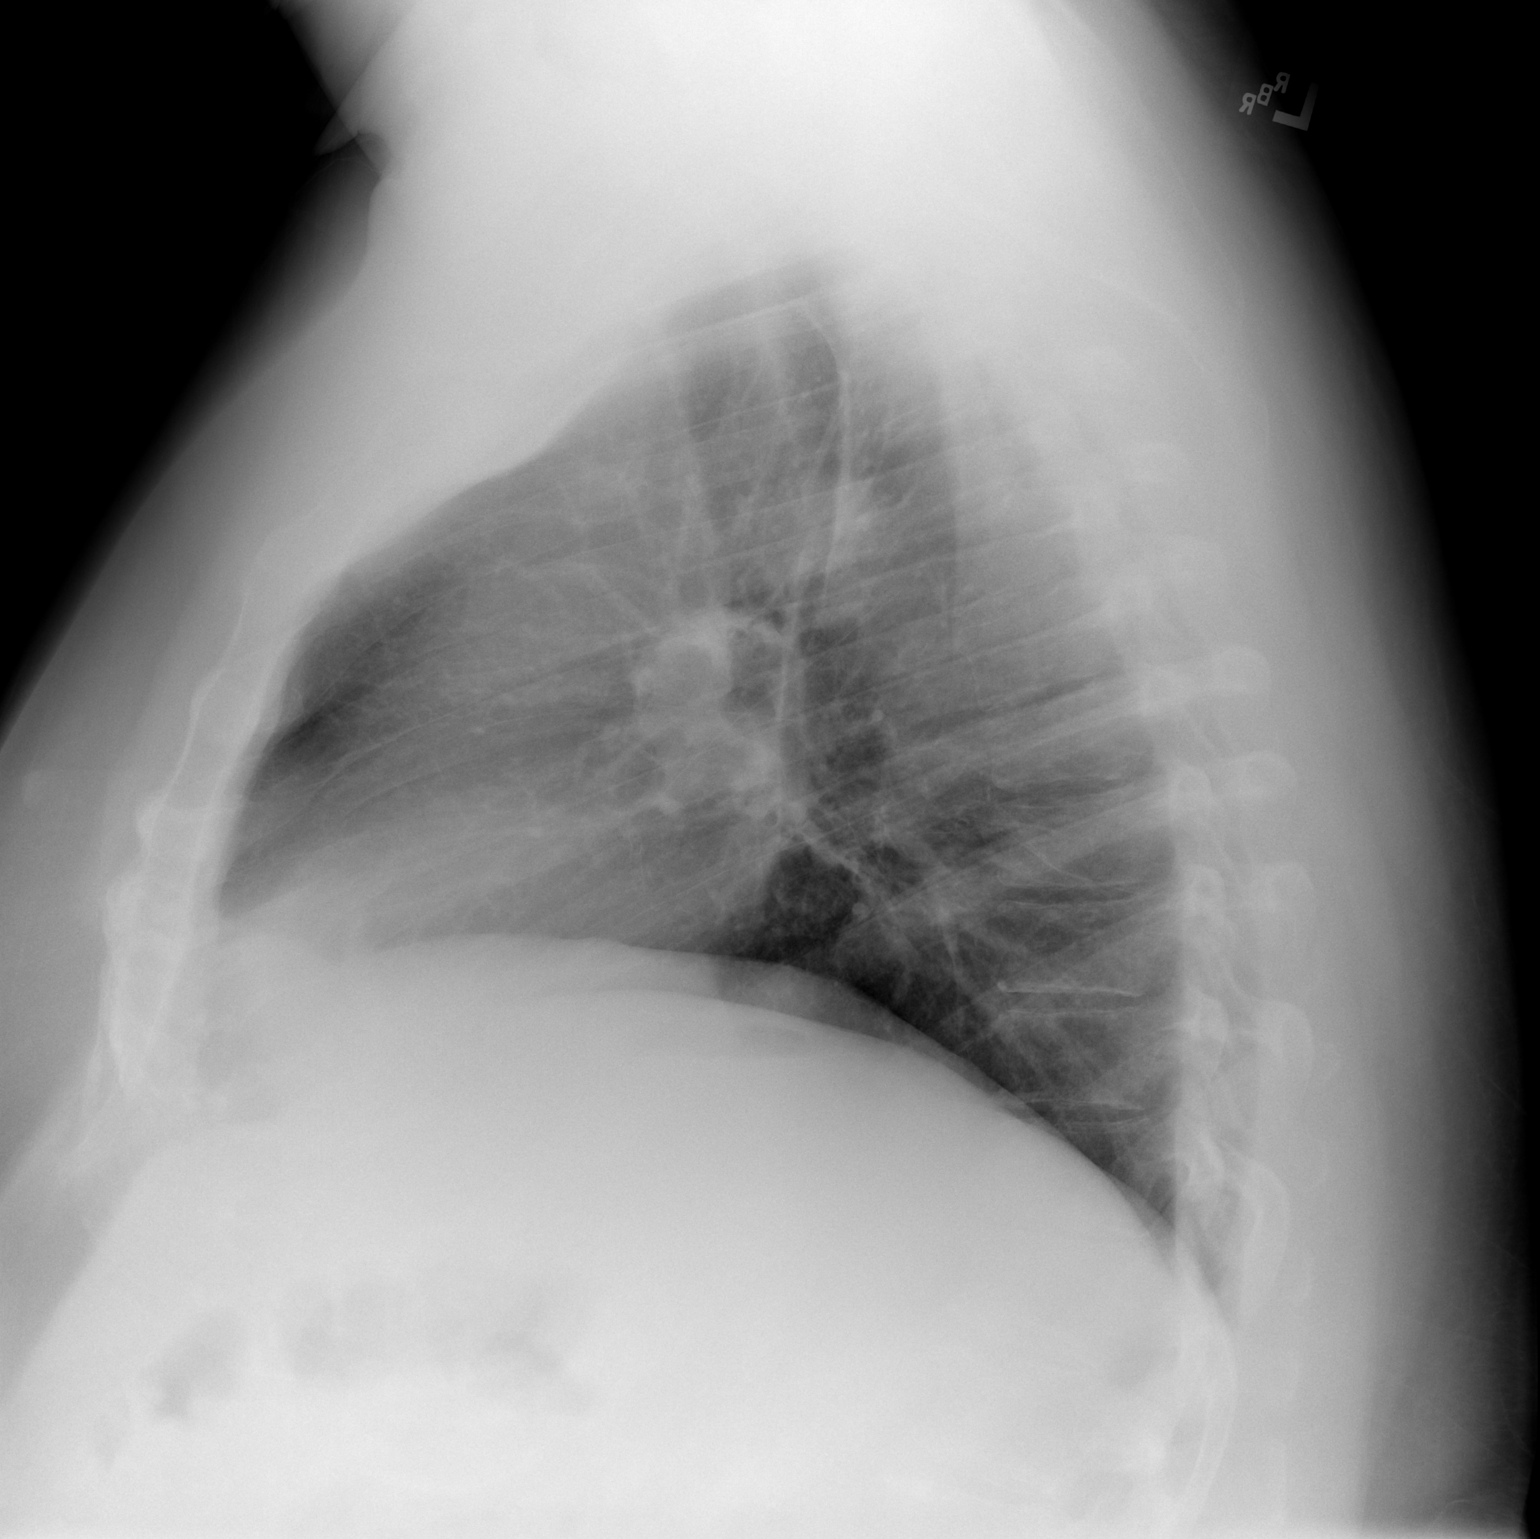

[2 of 2 positions shown; findings below may reference images not displayed]

FINDINGS: The heart size and mediastinal contours are within normal limits.
Normal pulmonary vascularity. Unchanged streaky atelectasis/scarring
at the right lung base. No focal consolidation, pleural effusion, or
pneumothorax. No acute osseous abnormality.
IMPRESSION: No active cardiopulmonary disease.

## 2019-12-16 ENCOUNTER — Other Ambulatory Visit: Payer: Self-pay | Admitting: Cardiology

## 2020-01-24 ENCOUNTER — Other Ambulatory Visit: Payer: Self-pay | Admitting: Cardiology

## 2020-09-06 ENCOUNTER — Ambulatory Visit: Payer: BLUE CROSS/BLUE SHIELD | Admitting: Cardiology

## 2020-09-29 DIAGNOSIS — M79671 Pain in right foot: Secondary | ICD-10-CM | POA: Insufficient documentation

## 2020-09-29 DIAGNOSIS — M79672 Pain in left foot: Secondary | ICD-10-CM

## 2020-09-29 HISTORY — DX: Pain in left foot: M79.672

## 2020-09-29 HISTORY — DX: Pain in right foot: M79.671

## 2020-10-27 ENCOUNTER — Other Ambulatory Visit: Payer: Self-pay | Admitting: Cardiology

## 2020-10-27 DIAGNOSIS — I10 Essential (primary) hypertension: Secondary | ICD-10-CM

## 2020-10-27 DIAGNOSIS — I2510 Atherosclerotic heart disease of native coronary artery without angina pectoris: Secondary | ICD-10-CM

## 2020-10-27 DIAGNOSIS — I471 Supraventricular tachycardia: Secondary | ICD-10-CM

## 2020-10-27 NOTE — Telephone Encounter (Signed)
Refill for Prasugrel 10 mg # 30 only, patient needs appointment for future refill. Message sent with refill for patient / 1st attempt

## 2021-02-15 ENCOUNTER — Ambulatory Visit: Payer: 59 | Admitting: Cardiology

## 2021-02-15 ENCOUNTER — Encounter: Payer: Self-pay | Admitting: Cardiology

## 2021-02-15 ENCOUNTER — Other Ambulatory Visit: Payer: Self-pay

## 2021-02-15 VITALS — BP 144/84 | HR 79 | Ht 75.0 in | Wt 373.8 lb

## 2021-02-15 DIAGNOSIS — I10 Essential (primary) hypertension: Secondary | ICD-10-CM

## 2021-02-15 DIAGNOSIS — E785 Hyperlipidemia, unspecified: Secondary | ICD-10-CM

## 2021-02-15 DIAGNOSIS — R002 Palpitations: Secondary | ICD-10-CM

## 2021-02-15 DIAGNOSIS — E088 Diabetes mellitus due to underlying condition with unspecified complications: Secondary | ICD-10-CM | POA: Diagnosis not present

## 2021-02-15 DIAGNOSIS — Z6841 Body Mass Index (BMI) 40.0 and over, adult: Secondary | ICD-10-CM

## 2021-02-15 DIAGNOSIS — I2510 Atherosclerotic heart disease of native coronary artery without angina pectoris: Secondary | ICD-10-CM

## 2021-02-15 NOTE — Patient Instructions (Addendum)
Medication Instructions:  Your physician has recommended you make the following change in your medication:   Stop Prasugrel. Stop atorvastatin.  *If you need a refill on your cardiac medications before your next appointment, please call your pharmacy*   Lab Work: None ordered If you have labs (blood work) drawn today and your tests are completely normal, you will receive your results only by: MyChart Message (if you have MyChart) OR A paper copy in the mail If you have any lab test that is abnormal or we need to change your treatment, we will call you to review the results.   Testing/Procedures: Your physician has requested that you have an echocardiogram. Echocardiography is a painless test that uses sound waves to create images of your heart. It provides your doctor with information about the size and shape of your heart and how well your heart's chambers and valves are working. This procedure takes approximately one hour. There are no restrictions for this procedure.    Follow-Up: At [facility], you and your health needs are our priority.  As part of our continuing mission to provide you with exceptional heart care, we have created designated Provider Care Teams.  These Care Teams include your primary Cardiologist (physician) and Advanced Practice Providers (APPs -  Physician Assistants and Nurse Practitioners) who all work together to provide you with the care you need, when you need it.  We recommend signing up for the patient portal called "MyChart".  Sign up information is provided on this After Visit Summary.  MyChart is used to connect with patients for Virtual Visits (Telemedicine).  Patients are able to view lab/test results, encounter notes, upcoming appointments, etc.  Non-urgent messages can be sent to your provider as well.   To learn more about what you can do with MyChart, go to [email or web address].    Your next appointment:   3 month(s)  The format for your  next appointment:   In Person  Provider:   Belva Crome, MD   Other Instructions Echocardiogram An echocardiogram is a test that uses sound waves (ultrasound) to produce images of the heart. Images from an echocardiogram can provide important information about: Heart size and shape. The size and thickness and movement of your heart's walls. Heart muscle function and strength. Heart valve function or if you have stenosis. Stenosis is when the heart valves are too narrow. If blood is flowing backward through the heart valves (regurgitation). A tumor or infectious growth around the heart valves. Areas of heart muscle that are not working well because of poor blood flow or injury from a heart attack. Aneurysm detection. An aneurysm is a weak or damaged part of an artery wall. The wall bulges out from the normal force of blood pumping through the body. Tell a health care provider about: Any allergies you have. All medicines you are taking, including vitamins, herbs, eye drops, creams, and over-the-counter medicines. Any blood disorders you have. Any surgeries you have had. Any medical conditions you have. Whether you are pregnant or may be pregnant. What are the risks? Generally, this is a safe test. However, problems may occur, including an allergic reaction to dye (contrast) that may be used during the test. What happens before the test? No specific preparation is needed. You may eat and drink normally. What happens during the test? You will take off your clothes from the waist up and put on a hospital gown. Electrodes or electrocardiogram (ECG)patches may be placed on your chest. The electrodes or  patches are then connected to a device that monitors your heart rate and rhythm. You will lie down on a table for an ultrasound exam. A gel will be applied to your chest to help sound waves pass through your skin. A handheld device, called a transducer, will be pressed against your chest  and moved over your heart. The transducer produces sound waves that travel to your heart and bounce back (or "echo" back) to the transducer. These sound waves will be captured in real-time and changed into images of your heart that can be viewed on a video monitor. The images will be recorded on a computer and reviewed by your health care provider. You may be asked to change positions or hold your breath for a short time. This makes it easier to get different views or better views of your heart. In some cases, you may receive contrast through an IV in one of your veins. This can improve the quality of the pictures from your heart. The procedure may vary among health care providers and hospitals.   What can I expect after the test? You may return to your normal, everyday life, including diet, activities, and medicines, unless your health care provider tells you not to do that. Follow these instructions at home: It is up to you to get the results of your test. Ask your health care provider, or the department that is doing the test, when your results will be ready. Keep all follow-up visits. This is important. Summary An echocardiogram is a test that uses sound waves (ultrasound) to produce images of the heart. Images from an echocardiogram can provide important information about the size and shape of your heart, heart muscle function, heart valve function, and other possible heart problems. You do not need to do anything to prepare before this test. You may eat and drink normally. After the echocardiogram is completed, you may return to your normal, everyday life, unless your health care provider tells you not to do that. This information is not intended to replace advice given to you by your health care provider. Make sure you discuss any questions you have with your health care provider. Document Revised: 04/04/2020 Document Reviewed: 04/04/2020 Elsevier Patient Education  2021 [employer].

## 2021-02-15 NOTE — Progress Notes (Signed)
Cardiology Office Note:    Date:  02/15/2021   ID:  FERD HORRIGAN, DOB [birth year], MRN [id number]  PCP:  Helen Hashimoto., MD  Cardiologist:  Jenean Lindau, MD   Referring MD: Helen Hashimoto., MD    ASSESSMENT:    1. CAD in native artery   2. Essential hypertension   3. Diabetes mellitus due to underlying condition with unspecified complications ([place])   4. Dyslipidemia   5. Morbid obesity with BMI of 45.0-49.9, adult ([place])   6. Palpitations    PLAN:    In order of problems listed above:  Coronary artery disease: Secondary prevention stressed with the patient.  Importance of compliance with diet medication stressed and vocalized understanding.  He mentions to me that he is asymptomatic.  He wants to start walking on a regular basis.  I discussed with him about lifestyle modification and secondary prevention and he promises to do better. Essential hypertension: Blood pressure stable and diet was emphasized.  His home blood pressure medications were reviewed.  His blood pressure at home is fine. Mixed dyslipidemia: Diet was emphasized.  He is taking 2 statins.  I told him to discontinue atorvastatin.  Also he is on dual antiplatelet therapy and I told him to stop his prasugrel at this time.  There is no clear indication for it. Diabetes mellitus and morbid obesity: These conditions are significantly out of control.  Hemoglobin A1c is greater than 10 and I discussed this and its ramifications with the patient in extensive length.  Diabetes managed by primary care.  He vocalized understanding.  He promises to do better. Echocardiogram will be done to assess murmur heard on auscultation. Patient will be seen in follow-up appointment in 3 months or earlier if the patient has any concerns    Medication Adjustments/Labs and Tests Ordered: Current medicines are reviewed at length with the patient today.  Concerns regarding medicines are outlined above.  Orders Placed This  Encounter  Procedures   EKG 12-Lead   ECHOCARDIOGRAM COMPLETE   No orders of the defined types were placed in this encounter.    No chief complaint on file.    History of Present Illness:    OLEY LAHAIE is a 55 y.o. male.  Patient has past medical history of coronary artery disease, essential hypertension, dyslipidemia and uncontrolled diabetes mellitus.  He is morbidly obese.  He was seen by me in the remote past but lost to follow-up.  He mentions to me that he lost his sister and subsequently really did not care.  No chest pain orthopnea or PND.  At the time of my evaluation, the patient is alert awake oriented and in no distress.  He leads a sedentary lifestyle.  Past Medical History:  Diagnosis Date   CAD in native artery 03/10/2015   Cath 2016:  100% mid Circumflex 90% Om ---> 0% with stent Mid RCA restenosis treated with Angiosculpt Moderate mid LAD   Capsulitis of metatarsophalangeal (MTP) joint of right foot 06/11/2016   Contusion of right heel 06/09/2017   Diabetes mellitus due to underlying condition with unspecified complications ([place]) 83/41/9622   Dizzy spells    Dyslipidemia 03/10/2015   Essential hypertension 03/10/2015   Foot pain, bilateral 09/29/2020   Ingrown nail 07/28/2018   Injury of toenail of left foot 06/21/2016   Loose toenail 06/21/2016   Morbid obesity with BMI of 45.0-49.9, adult ([place]) 03/10/2015   Nondisplaced fracture of body of right calcaneus, initial encounter for closed  fracture 06/09/2017   OSA on CPAP 02/24/2017   Plantar callus 05/13/2019   Right foot pain 06/09/2017   SOB (shortness of breath) 10/01/2017   SVT (supraventricular tachycardia) ([place]) 02/21/2017   Uncontrolled type 2 diabetes mellitus without complication, with long-term current use of insulin 06/11/2016    Past Surgical History:  Procedure Laterality Date   LEFT HEART CATH AND CORONARY ANGIOGRAPHY N/A 10/06/2017   Procedure: LEFT HEART CATH AND CORONARY ANGIOGRAPHY;  Surgeon: Belva Crome, MD;  Location: [facility];  Service: Cardiovascular;  Laterality: N/A;    Current Medications: Current Meds  Medication Sig   amLODipine (NORVASC) 10 MG tablet Take 10 mg by mouth daily.    aspirin EC 81 MG tablet Take 81 mg by mouth daily.    budesonide-formoterol (SYMBICORT) 160-4.5 MCG/ACT inhaler Inhale 2 puffs into the lungs daily.    carvedilol (COREG) 25 MG tablet Take 25 mg by mouth 2 (two) times daily.   colesevelam (WELCHOL) 625 MG tablet Take 1,875 mg by mouth 2 (two) times daily with a meal.    famotidine (PEPCID) 20 MG tablet Take 20 mg by mouth daily.   hydrALAZINE (APRESOLINE) 25 MG tablet Take 25 mg by mouth 3 (three) times daily.   liraglutide (VICTOZA) 18 MG/3ML SOPN Inject 1.8 mg into the skin daily.   nitroGLYCERIN (NITROSTAT) 0.4 MG SL tablet Place 0.4 mg under the tongue every 5 (five) minutes as needed for chest pain.   pioglitazone-metformin (ACTOPLUS MET) 15-850 MG tablet Take 1 tablet by mouth 2 (two) times daily with a meal.    rosuvastatin (CRESTOR) 40 MG tablet Take 40 mg by mouth daily.   TOUJEO SOLOSTAR 300 UNIT/ML SOPN Inject 80 Units into the skin daily.   valsartan (DIOVAN) 320 MG tablet Take 320 mg by mouth daily.   [DISCONTINUED] atorvastatin (LIPITOR) 10 MG tablet Take 1 tablet by mouth daily.   [DISCONTINUED] prasugrel (EFFIENT) 10 MG TABS tablet Take 1 tablet (10 mg total) by mouth daily. NEED APPOINTMENT FOR FUTURE REFILL     Allergies:   Codeine and Hydrocodone-acetaminophen   Social History   Socioeconomic History   Marital status: Single    Spouse name: Not on file   Number of children: Not on file   Years of education: Not on file   Highest education level: Not on file  Occupational History   Not on file  Tobacco Use   Smoking status: Former    Pack years: 0.00   Smokeless tobacco: Former  Substance and Sexual Activity   Alcohol use: Not on file   Drug use: Not on file   Sexual activity: Not on file  Other Topics  Concern   Not on file  Social History Narrative   Not on file   Social Determinants of Health   Financial Resource Strain: Not on file  Food Insecurity: Not on file  Transportation Needs: Not on file  Physical Activity: Not on file  Stress: Not on file  Social Connections: Not on file     Family History: The patient's family history includes Cancer in his mother; Heart disease in his father; Stroke in his mother.  ROS:   Please see the history of present illness.    All other systems reviewed and are negative.  EKGs/Labs/Other Studies Reviewed:    The following studies were reviewed today: EKG reveals sinus rhythm and nonspecific ST-T changes.   Recent Labs: No results found for requested labs within last 8760 hours.  Recent Lipid Panel No results found for: CHOL, TRIG, HDL, CHOLHDL, VLDL, LDLCALC, LDLDIRECT  Physical Exam:    VS:  BP (!) 144/84   Pulse 79   Ht _0  (1.905 m)   Wt (!) 373 lb 12.8 oz (169.6 kg)   SpO2 97%   BMI 46.72 kg/m     Wt Readings from Last 3 Encounters:  02/15/21 (!) 373 lb 12.8 oz (169.6 kg)  09/25/18 (!) 373 lb (169.2 kg)  10/29/17 (!) 365 lb (165.6 kg)     GEN: Patient is in no acute distress HEENT: Normal NECK: No JVD; No carotid bruits LYMPHATICS: No lymphadenopathy CARDIAC: Hear sounds regular, 2/6 systolic murmur at the apex. RESPIRATORY:  Clear to auscultation without rales, wheezing or rhonchi  ABDOMEN: Soft, non-tender, non-distended MUSCULOSKELETAL:  No edema; No deformity  SKIN: Warm and dry NEUROLOGIC:  Alert and oriented x 3 PSYCHIATRIC:  Normal affect   Signed, Jenean Lindau, MD  02/15/2021 11:46 AM    [place]

## 2021-03-07 ENCOUNTER — Other Ambulatory Visit: Payer: Self-pay

## 2021-03-07 ENCOUNTER — Ambulatory Visit (INDEPENDENT_AMBULATORY_CARE_PROVIDER_SITE_OTHER): Payer: 59

## 2021-03-07 DIAGNOSIS — I2510 Atherosclerotic heart disease of native coronary artery without angina pectoris: Secondary | ICD-10-CM | POA: Diagnosis not present

## 2021-03-07 LAB — ECHOCARDIOGRAM COMPLETE
Area-P 1/2: 4.08 cm2
S' Lateral: 4.1 cm

## 2021-03-15 ENCOUNTER — Telehealth: Payer: Self-pay

## 2021-03-15 NOTE — Telephone Encounter (Signed)
Left message on patients voicemail to please return our call.   Letter mailed to the patient at this time.  

## 2021-03-15 NOTE — Telephone Encounter (Signed)
Pt returning phone call, please advise 

## 2021-03-15 NOTE — Telephone Encounter (Signed)
-----   Message from Garwin Brothers, MD sent at 03/13/2021  2:08 PM EDT ----- The results of the study is unremarkable. Please inform patient. I will discuss in detail at next appointment. Cc  primary care/referring physician Garwin Brothers, MD 03/13/2021 2:08 PM

## 2021-03-30 LAB — POCT INR: INR: 1 — AB (ref 2.0–3.0)

## 2021-03-31 DIAGNOSIS — R778 Other specified abnormalities of plasma proteins: Secondary | ICD-10-CM

## 2021-03-31 DIAGNOSIS — I10 Essential (primary) hypertension: Secondary | ICD-10-CM

## 2021-03-31 DIAGNOSIS — E119 Type 2 diabetes mellitus without complications: Secondary | ICD-10-CM

## 2021-03-31 DIAGNOSIS — I471 Supraventricular tachycardia: Secondary | ICD-10-CM

## 2021-03-31 DIAGNOSIS — R079 Chest pain, unspecified: Secondary | ICD-10-CM

## 2021-03-31 DIAGNOSIS — E785 Hyperlipidemia, unspecified: Secondary | ICD-10-CM

## 2021-04-01 DIAGNOSIS — I471 Supraventricular tachycardia: Secondary | ICD-10-CM | POA: Diagnosis not present

## 2021-04-01 DIAGNOSIS — E119 Type 2 diabetes mellitus without complications: Secondary | ICD-10-CM | POA: Diagnosis not present

## 2021-04-01 DIAGNOSIS — R778 Other specified abnormalities of plasma proteins: Secondary | ICD-10-CM | POA: Diagnosis not present

## 2021-04-01 DIAGNOSIS — R079 Chest pain, unspecified: Secondary | ICD-10-CM | POA: Diagnosis not present

## 2021-04-02 ENCOUNTER — Inpatient Hospital Stay (HOSPITAL_COMMUNITY)
Admission: EM | Admit: 2021-04-02 | Discharge: 2021-04-16 | DRG: 234 | Disposition: A | Discharge status: Home Health | Payer: 59 | Source: Other Acute Inpatient Hospital | Attending: Thoracic Surgery (Cardiothoracic Vascular Surgery) | Admitting: Thoracic Surgery (Cardiothoracic Vascular Surgery)

## 2021-04-02 ENCOUNTER — Encounter (HOSPITAL_COMMUNITY): Payer: Self-pay | Admitting: Cardiovascular Disease

## 2021-04-02 ENCOUNTER — Other Ambulatory Visit: Payer: Self-pay

## 2021-04-02 ENCOUNTER — Encounter (HOSPITAL_COMMUNITY)
Admission: EM | Disposition: A | Discharge status: Home Health | Payer: Self-pay | Source: Other Acute Inpatient Hospital | Attending: Thoracic Surgery (Cardiothoracic Vascular Surgery)

## 2021-04-02 DIAGNOSIS — Z01811 Encounter for preprocedural respiratory examination: Secondary | ICD-10-CM

## 2021-04-02 DIAGNOSIS — I129 Hypertensive chronic kidney disease with stage 1 through stage 4 chronic kidney disease, or unspecified chronic kidney disease: Secondary | ICD-10-CM | POA: Diagnosis present

## 2021-04-02 DIAGNOSIS — I2510 Atherosclerotic heart disease of native coronary artery without angina pectoris: Secondary | ICD-10-CM | POA: Diagnosis present

## 2021-04-02 DIAGNOSIS — Z01810 Encounter for preprocedural cardiovascular examination: Secondary | ICD-10-CM | POA: Diagnosis not present

## 2021-04-02 DIAGNOSIS — Z8249 Family history of ischemic heart disease and other diseases of the circulatory system: Secondary | ICD-10-CM

## 2021-04-02 DIAGNOSIS — D62 Acute posthemorrhagic anemia: Secondary | ICD-10-CM | POA: Diagnosis not present

## 2021-04-02 DIAGNOSIS — T82855A Stenosis of coronary artery stent, initial encounter: Secondary | ICD-10-CM | POA: Diagnosis present

## 2021-04-02 DIAGNOSIS — I2582 Chronic total occlusion of coronary artery: Secondary | ICD-10-CM | POA: Diagnosis present

## 2021-04-02 DIAGNOSIS — E1169 Type 2 diabetes mellitus with other specified complication: Secondary | ICD-10-CM | POA: Diagnosis present

## 2021-04-02 DIAGNOSIS — Z87891 Personal history of nicotine dependence: Secondary | ICD-10-CM

## 2021-04-02 DIAGNOSIS — Z20822 Contact with and (suspected) exposure to covid-19: Secondary | ICD-10-CM | POA: Diagnosis present

## 2021-04-02 DIAGNOSIS — Z885 Allergy status to narcotic agent status: Secondary | ICD-10-CM | POA: Diagnosis not present

## 2021-04-02 DIAGNOSIS — J45909 Unspecified asthma, uncomplicated: Secondary | ICD-10-CM | POA: Diagnosis present

## 2021-04-02 DIAGNOSIS — I214 Non-ST elevation (NSTEMI) myocardial infarction: Principal | ICD-10-CM | POA: Diagnosis present

## 2021-04-02 DIAGNOSIS — E119 Type 2 diabetes mellitus without complications: Secondary | ICD-10-CM | POA: Diagnosis not present

## 2021-04-02 DIAGNOSIS — E785 Hyperlipidemia, unspecified: Secondary | ICD-10-CM | POA: Diagnosis present

## 2021-04-02 DIAGNOSIS — R079 Chest pain, unspecified: Secondary | ICD-10-CM | POA: Diagnosis present

## 2021-04-02 DIAGNOSIS — E088 Diabetes mellitus due to underlying condition with unspecified complications: Secondary | ICD-10-CM | POA: Diagnosis present

## 2021-04-02 DIAGNOSIS — I480 Paroxysmal atrial fibrillation: Secondary | ICD-10-CM | POA: Diagnosis present

## 2021-04-02 DIAGNOSIS — J90 Pleural effusion, not elsewhere classified: Secondary | ICD-10-CM

## 2021-04-02 DIAGNOSIS — Z79899 Other long term (current) drug therapy: Secondary | ICD-10-CM

## 2021-04-02 DIAGNOSIS — I471 Supraventricular tachycardia: Secondary | ICD-10-CM | POA: Diagnosis present

## 2021-04-02 DIAGNOSIS — R778 Other specified abnormalities of plasma proteins: Secondary | ICD-10-CM | POA: Diagnosis not present

## 2021-04-02 DIAGNOSIS — Z9861 Coronary angioplasty status: Secondary | ICD-10-CM

## 2021-04-02 DIAGNOSIS — G4733 Obstructive sleep apnea (adult) (pediatric): Secondary | ICD-10-CM | POA: Diagnosis present

## 2021-04-02 DIAGNOSIS — E8770 Fluid overload, unspecified: Secondary | ICD-10-CM | POA: Diagnosis not present

## 2021-04-02 DIAGNOSIS — Z7982 Long term (current) use of aspirin: Secondary | ICD-10-CM | POA: Diagnosis not present

## 2021-04-02 DIAGNOSIS — I25110 Atherosclerotic heart disease of native coronary artery with unstable angina pectoris: Secondary | ICD-10-CM | POA: Diagnosis present

## 2021-04-02 DIAGNOSIS — Z6841 Body Mass Index (BMI) 40.0 and over, adult: Secondary | ICD-10-CM

## 2021-04-02 DIAGNOSIS — I25118 Atherosclerotic heart disease of native coronary artery with other forms of angina pectoris: Secondary | ICD-10-CM

## 2021-04-02 DIAGNOSIS — E1122 Type 2 diabetes mellitus with diabetic chronic kidney disease: Secondary | ICD-10-CM | POA: Diagnosis present

## 2021-04-02 DIAGNOSIS — Z794 Long term (current) use of insulin: Secondary | ICD-10-CM | POA: Diagnosis not present

## 2021-04-02 DIAGNOSIS — E669 Obesity, unspecified: Secondary | ICD-10-CM | POA: Diagnosis not present

## 2021-04-02 DIAGNOSIS — N189 Chronic kidney disease, unspecified: Secondary | ICD-10-CM | POA: Diagnosis present

## 2021-04-02 DIAGNOSIS — Z09 Encounter for follow-up examination after completed treatment for conditions other than malignant neoplasm: Secondary | ICD-10-CM

## 2021-04-02 DIAGNOSIS — Z951 Presence of aortocoronary bypass graft: Secondary | ICD-10-CM

## 2021-04-02 DIAGNOSIS — I200 Unstable angina: Secondary | ICD-10-CM

## 2021-04-02 DIAGNOSIS — I249 Acute ischemic heart disease, unspecified: Secondary | ICD-10-CM | POA: Diagnosis present

## 2021-04-02 DIAGNOSIS — Z7951 Long term (current) use of inhaled steroids: Secondary | ICD-10-CM | POA: Diagnosis not present

## 2021-04-02 HISTORY — PX: INTRAVASCULAR PRESSURE WIRE/FFR STUDY: CATH118243

## 2021-04-02 HISTORY — DX: Chest pain, unspecified: R07.9

## 2021-04-02 HISTORY — PX: LEFT HEART CATH AND CORONARY ANGIOGRAPHY: CATH118249

## 2021-04-02 HISTORY — DX: Acute ischemic heart disease, unspecified: I24.9

## 2021-04-02 LAB — GLUCOSE, CAPILLARY
Glucose-Capillary: 138 mg/dL — ABNORMAL HIGH (ref 70–99)
Glucose-Capillary: 195 mg/dL — ABNORMAL HIGH (ref 70–99)

## 2021-04-02 LAB — HEMOGLOBIN A1C
Hgb A1c MFr Bld: 6.4 % — ABNORMAL HIGH (ref 4.8–5.6)
Mean Plasma Glucose: 136.98 mg/dL

## 2021-04-02 LAB — CREATININE, SERUM
Creatinine, Ser: 1.13 mg/dL (ref 0.61–1.24)
GFR, Estimated: >60 mL/min (ref >60)

## 2021-04-02 LAB — CBC
HCT: 46 % (ref 39.0–52.0)
Hemoglobin: 16 g/dL (ref 13.0–17.0)
MCH: 31.8 pg (ref 26.0–34.0)
MCHC: 34.8 g/dL (ref 30.0–36.0)
MCV: 91.5 fL (ref 80.0–100.0)
Platelets: 235 10*3/uL (ref 150–400)
RBC: 5.03 MIL/uL (ref 4.22–5.81)
RDW: 13.9 % (ref 11.5–15.5)
WBC: 9.9 10*3/uL (ref 4.0–10.5)
nRBC: 0 % (ref 0.0–0.2)

## 2021-04-02 LAB — POCT ACTIVATED CLOTTING TIME: Activated Clotting Time: 254 seconds

## 2021-04-02 SURGERY — LEFT HEART CATH AND CORONARY ANGIOGRAPHY
Anesthesia: LOCAL

## 2021-04-02 MED ORDER — LABETALOL HCL 5 MG/ML IV SOLN: 10.0000 mg | INTRAVENOUS | Status: AC | PRN

## 2021-04-02 MED ORDER — AMLODIPINE BESYLATE 10 MG PO TABS
10.0000 mg | ORAL_TABLET | Freq: Every day | ORAL | Status: DC
  Administered 2021-04-03 – 2021-04-04 (×2): 10 mg via ORAL
  Filled 2021-04-02 (×2): qty 1

## 2021-04-02 MED ORDER — LIDOCAINE HCL (PF) 1 % IJ SOLN
INTRAMUSCULAR | Status: AC
  Filled 2021-04-02: qty 30

## 2021-04-02 MED ORDER — SODIUM CHLORIDE 0.9% FLUSH
3.0000 mL | Freq: Two times a day (BID) | INTRAVENOUS | Status: DC
  Administered 2021-04-02 – 2021-04-04 (×2): 3 mL via INTRAVENOUS

## 2021-04-02 MED ORDER — COLESEVELAM HCL 625 MG PO TABS
1875.0000 mg | ORAL_TABLET | Freq: Two times a day (BID) | ORAL | Status: DC
  Administered 2021-04-03 – 2021-04-16 (×25): 1875 mg via ORAL
  Filled 2021-04-02 (×28): qty 3

## 2021-04-02 MED ORDER — IOHEXOL 350 MG/ML SOLN
INTRAVENOUS | Status: DC | PRN
  Administered 2021-04-02: 155 mL

## 2021-04-02 MED ORDER — VERAPAMIL HCL 2.5 MG/ML IV SOLN
INTRAVENOUS | Status: DC | PRN
  Administered 2021-04-02: 10 mL via INTRA_ARTERIAL

## 2021-04-02 MED ORDER — FENTANYL CITRATE (PF) 100 MCG/2ML IJ SOLN
INTRAMUSCULAR | Status: DC | PRN
  Administered 2021-04-02: 50 ug via INTRAVENOUS
  Administered 2021-04-02: 25 ug via INTRAVENOUS

## 2021-04-02 MED ORDER — INSULIN ASPART 100 UNIT/ML IJ SOLN
0.0000 [IU] | Freq: Three times a day (TID) | INTRAMUSCULAR | Status: DC
  Administered 2021-04-03 – 2021-04-04 (×4): 2 [IU] via SUBCUTANEOUS

## 2021-04-02 MED ORDER — SODIUM CHLORIDE 0.9 % WEIGHT BASED INFUSION: 3.0000 mL/kg/h | INTRAVENOUS | Status: DC

## 2021-04-02 MED ORDER — HEPARIN (PORCINE) 25000 UT/250ML-% IV SOLN
1950.0000 [IU]/h | INTRAVENOUS | Status: DC
  Administered 2021-04-03: 1500 [IU]/h via INTRAVENOUS
  Administered 2021-04-03: 2150 [IU]/h via INTRAVENOUS
  Administered 2021-04-04 (×2): 1950 [IU]/h via INTRAVENOUS
  Filled 2021-04-02 (×5): qty 250

## 2021-04-02 MED ORDER — NITROGLYCERIN 0.4 MG SL SUBL: 0.4000 mg | SUBLINGUAL_TABLET | SUBLINGUAL | Status: DC | PRN

## 2021-04-02 MED ORDER — IRBESARTAN 150 MG PO TABS
300.0000 mg | ORAL_TABLET | Freq: Every day | ORAL | Status: DC
  Administered 2021-04-03 – 2021-04-04 (×2): 300 mg via ORAL
  Filled 2021-04-02 (×2): qty 2

## 2021-04-02 MED ORDER — CARVEDILOL 25 MG PO TABS
25.0000 mg | ORAL_TABLET | Freq: Two times a day (BID) | ORAL | Status: DC
  Administered 2021-04-02 – 2021-04-04 (×5): 25 mg via ORAL
  Filled 2021-04-02 (×5): qty 1

## 2021-04-02 MED ORDER — MOMETASONE FURO-FORMOTEROL FUM 200-5 MCG/ACT IN AERO
2.0000 | INHALATION_SPRAY | Freq: Two times a day (BID) | RESPIRATORY_TRACT | Status: DC
  Administered 2021-04-03 – 2021-04-04 (×4): 2 via RESPIRATORY_TRACT
  Filled 2021-04-02: qty 8.8

## 2021-04-02 MED ORDER — SODIUM CHLORIDE 0.9 % WEIGHT BASED INFUSION: 1.0000 mL/kg/h | INTRAVENOUS | Status: DC

## 2021-04-02 MED ORDER — VERAPAMIL HCL 2.5 MG/ML IV SOLN
INTRAVENOUS | Status: AC
  Filled 2021-04-02: qty 2

## 2021-04-02 MED ORDER — SODIUM CHLORIDE 0.9 % IV SOLN: INTRAVENOUS | Status: AC

## 2021-04-02 MED ORDER — HYDRALAZINE HCL 20 MG/ML IJ SOLN: 10.0000 mg | INTRAMUSCULAR | Status: AC | PRN

## 2021-04-02 MED ORDER — MIDAZOLAM HCL 2 MG/2ML IJ SOLN
INTRAMUSCULAR | Status: AC
  Filled 2021-04-02: qty 2

## 2021-04-02 MED ORDER — INSULIN GLARGINE-YFGN 100 UNIT/ML ~~LOC~~ SOLN: 60.0000 [IU] | Freq: Every day | SUBCUTANEOUS | Status: DC

## 2021-04-02 MED ORDER — HEPARIN SODIUM (PORCINE) 5000 UNIT/ML IJ SOLN
5000.0000 [IU] | Freq: Three times a day (TID) | INTRAMUSCULAR | Status: DC
Start: 2021-04-02 — End: 2021-04-02

## 2021-04-02 MED ORDER — ONDANSETRON HCL 4 MG/2ML IJ SOLN: 4.0000 mg | Freq: Four times a day (QID) | INTRAMUSCULAR | Status: DC | PRN

## 2021-04-02 MED ORDER — FENTANYL CITRATE (PF) 100 MCG/2ML IJ SOLN
INTRAMUSCULAR | Status: AC
  Filled 2021-04-02: qty 2

## 2021-04-02 MED ORDER — LIDOCAINE HCL (PF) 1 % IJ SOLN
INTRAMUSCULAR | Status: DC | PRN
  Administered 2021-04-02: 6 mL

## 2021-04-02 MED ORDER — HEPARIN SODIUM (PORCINE) 1000 UNIT/ML IJ SOLN
INTRAMUSCULAR | Status: DC | PRN
  Administered 2021-04-02: 8500 [IU] via INTRAVENOUS
  Administered 2021-04-02: 9000 [IU] via INTRAVENOUS

## 2021-04-02 MED ORDER — HEPARIN (PORCINE) IN NACL 1000-0.9 UT/500ML-% IV SOLN
INTRAVENOUS | Status: DC | PRN
  Administered 2021-04-02 (×2): 500 mL

## 2021-04-02 MED ORDER — MIDAZOLAM HCL 2 MG/2ML IJ SOLN
INTRAMUSCULAR | Status: DC | PRN
  Administered 2021-04-02: 1 mg via INTRAVENOUS
  Administered 2021-04-02: 2 mg via INTRAVENOUS

## 2021-04-02 MED ORDER — INSULIN GLARGINE-YFGN 100 UNIT/ML ~~LOC~~ SOLN
40.0000 [IU] | Freq: Every day | SUBCUTANEOUS | Status: DC
  Administered 2021-04-03 – 2021-04-04 (×2): 40 [IU] via SUBCUTANEOUS
  Filled 2021-04-02 (×3): qty 0.4

## 2021-04-02 MED ORDER — HEPARIN SODIUM (PORCINE) 1000 UNIT/ML IJ SOLN
INTRAMUSCULAR | Status: AC
  Filled 2021-04-02: qty 1

## 2021-04-02 MED ORDER — HEPARIN (PORCINE) IN NACL 1000-0.9 UT/500ML-% IV SOLN
INTRAVENOUS | Status: AC
  Filled 2021-04-02: qty 1000

## 2021-04-02 MED ORDER — ASPIRIN EC 81 MG PO TBEC
81.0000 mg | DELAYED_RELEASE_TABLET | Freq: Every day | ORAL | Status: DC
  Administered 2021-04-03 – 2021-04-04 (×2): 81 mg via ORAL
  Filled 2021-04-02 (×2): qty 1

## 2021-04-02 MED ORDER — ASPIRIN 81 MG PO CHEW
81.0000 mg | CHEWABLE_TABLET | ORAL | Status: AC
  Administered 2021-04-02: 81 mg via ORAL

## 2021-04-02 MED ORDER — ACETAMINOPHEN 325 MG PO TABS: 650.0000 mg | ORAL_TABLET | ORAL | Status: DC | PRN

## 2021-04-02 MED ORDER — HYDRALAZINE HCL 25 MG PO TABS
25.0000 mg | ORAL_TABLET | Freq: Three times a day (TID) | ORAL | Status: DC
  Administered 2021-04-02 – 2021-04-03 (×2): 25 mg via ORAL
  Filled 2021-04-02 (×2): qty 1

## 2021-04-02 MED ORDER — ASPIRIN 81 MG PO CHEW
CHEWABLE_TABLET | ORAL | Status: AC
  Filled 2021-04-02: qty 1

## 2021-04-02 MED ORDER — ONDANSETRON HCL 4 MG/2ML IJ SOLN
INTRAMUSCULAR | Status: DC | PRN
  Administered 2021-04-02: 4 mg via INTRAVENOUS

## 2021-04-02 MED ORDER — SODIUM CHLORIDE 0.9 % IV SOLN: 250.0000 mL | INTRAVENOUS | Status: DC | PRN

## 2021-04-02 MED ORDER — ONDANSETRON HCL 4 MG/2ML IJ SOLN
INTRAMUSCULAR | Status: AC
  Filled 2021-04-02: qty 2

## 2021-04-02 MED ORDER — ROSUVASTATIN CALCIUM 20 MG PO TABS
40.0000 mg | ORAL_TABLET | Freq: Every day | ORAL | Status: DC
  Administered 2021-04-03 – 2021-04-16 (×13): 40 mg via ORAL
  Filled 2021-04-02 (×14): qty 2

## 2021-04-02 MED ORDER — SODIUM CHLORIDE 0.9% FLUSH
3.0000 mL | INTRAVENOUS | Status: DC | PRN
Start: 2021-04-02 — End: 2021-04-05

## 2021-04-02 MED ORDER — FAMOTIDINE 20 MG PO TABS
20.0000 mg | ORAL_TABLET | Freq: Every day | ORAL | Status: DC
  Administered 2021-04-04: 20 mg via ORAL
  Filled 2021-04-02 (×2): qty 1

## 2021-04-02 SURGICAL SUPPLY — 19 items
CATH 5FR JL3.5 JR4 ANG PIG MP (CATHETERS) ×2 IMPLANT
CATH INFINITI 5 FR 3DRC (CATHETERS) ×2 IMPLANT
CATH LAUNCHER 5F RADR (CATHETERS) ×1 IMPLANT
CATH LAUNCHER 6FR EBU3.5 (CATHETERS) ×2 IMPLANT
CATH OPTITORQUE TIG 4.0 5F (CATHETERS) ×2 IMPLANT
CATH VISTA GUIDE 6FR XB4 (CATHETERS) ×2 IMPLANT
CATHETER LAUNCHER 5F RADR (CATHETERS) ×2
DEVICE RAD COMP TR BAND LRG (VASCULAR PRODUCTS) ×2 IMPLANT
GLIDESHEATH SLEND SS 6F .021 (SHEATH) ×2 IMPLANT
GUIDEWIRE INQWIRE 1.5J.035X260 (WIRE) ×1 IMPLANT
GUIDEWIRE PRESSURE X 175 (WIRE) ×2 IMPLANT
INQWIRE 1.5J .035X260CM (WIRE) ×2
KIT ENCORE 26 ADVANTAGE (KITS) ×2 IMPLANT
KIT HEART LEFT (KITS) ×2 IMPLANT
PACK CARDIAC CATHETERIZATION (CUSTOM PROCEDURE TRAY) ×2 IMPLANT
SHEATH PROBE COVER 6X72 (BAG) ×2 IMPLANT
TRANSDUCER W/STOPCOCK (MISCELLANEOUS) ×2 IMPLANT
TUBING CIL FLEX 10 FLL-RA (TUBING) ×2 IMPLANT
WIRE HI TORQ VERSACORE-J 145CM (WIRE) ×2 IMPLANT

## 2021-04-02 NOTE — Progress Notes (Signed)
ANTICOAGULATION CONSULT NOTE  Pharmacy Consult for heparin Indication: chest pain/ACS  Allergies  Allergen Reactions   Codeine Shortness Of Breath    "I turn bright red and have difficulty breathing"   Hydrocodone-Acetaminophen Shortness Of Breath    "I turn red and have difficulty breathing"    Patient Measurements: Height: 6\' 3"  (190.5 cm) Weight: (!) 172.4 kg (380 lb) IBW/kg (Calculated) : 84.5 Heparin Dosing Weight: 125kg  Vital Signs: BP: 154/93 (08/08 1720) Pulse Rate: 76 (08/08 1720)  Labs: No results for input(s): HGB, HCT, PLT, APTT, LABPROT, INR, HEPARINUNFRC, HEPRLOWMOCWT, CREATININE, CKTOTAL, CKMB, TROPONINIHS in the last 72 hours.  CrCl cannot be calculated (Patient's most recent lab result is older than the maximum 21 days allowed.).   Medical History: Past Medical History:  Diagnosis Date   CAD in native artery 03/10/2015   Cath 2016:  100% mid Circumflex 90% Om ---> 0% with stent Mid RCA restenosis treated with Angiosculpt Moderate mid LAD   Capsulitis of metatarsophalangeal (MTP) joint of right foot 06/11/2016   Contusion of right heel 06/09/2017   Diabetes mellitus due to underlying condition with unspecified complications ([facility]) 06/19/2015   Dizzy spells    Dyslipidemia 03/10/2015   Essential hypertension 03/10/2015   Foot pain, bilateral 09/29/2020   Ingrown nail 07/28/2018   Injury of toenail of left foot 06/21/2016   Loose toenail 06/21/2016   Morbid obesity with BMI of 45.0-49.9, adult ([facility]) 03/10/2015   Nondisplaced fracture of body of right calcaneus, initial encounter for closed fracture 06/09/2017   OSA on CPAP 02/24/2017   Plantar callus 05/13/2019   Right foot pain 06/09/2017   SOB (shortness of breath) 10/01/2017   SVT (supraventricular tachycardia) ([facility]) 02/21/2017   Uncontrolled type 2 diabetes mellitus without complication, with long-term current use of insulin 06/11/2016     Assessment: 55 yoM admitted from OSH with CP s/p LHC with mvCAD.  Pharmacy asked to resume heparin 8h after sheath pull while undergoing CABG workup. Sheath removed ~1600. No AC PTA.  Goal of Therapy:  Heparin level 0.3-0.7 units/ml Monitor platelets by anticoagulation protocol: Yes   Plan:  Heparin 1500 units/h no bolus at 0000 (midnight 8/9) Check heparin level 6h after starting   10/9, PharmD, BCPS, [facility] Clinical Pharmacist [phone] Please check AMION for all [facility] numbers 04/02/2021

## 2021-04-02 NOTE — Progress Notes (Signed)
Pt states CBG is 125 per glucose monitor

## 2021-04-02 NOTE — H&P (Addendum)
Cardiology Admission History and Physical:   Patient ID: Kevin Lawrence MRN: [id number]; DOB: 1966-08-06   Admission date: 04/02/2021  PCP:  Helen Hashimoto., MD   [facility] Providers Cardiologist:  Jenean Lindau, MD   {   Chief Complaint:  Chest Pain  Patient Profile:   Kevin Lawrence is a 55 y.o. male with a history of CAD s/p multiple overlapping stents of the RCA and stenting to OM1 with total occlusion of LCX on cardiac catheterization in 2019, paroxysmal SVT, hypertension, dyslipidemia, uncontrolled type 2 diabetes mellitus, morbid obesity, and remote smoking history (quit 20 years ago) who is transferred from [facility] for further management of chest pain with minimally elevated troponin.  History of Present Illness:   Kevin Lawrence is a 55 year old male with the above history who is followed by Dr. Geraldo Pitter. Patient has a significant history of CAD s/p multiple PCIs. Last cardiac catheterization in 09/2017 showed with multiple overlapping stents of the RCA with a region of 50-60% restenosis, widely patent stent to OM1, CTO of LCX, and 65% stenosis of proximal LAD. Patient was last seen by Dr. Geraldo Pitter on 6/223/2022 at which time he reported doing well from a cardiac standpoint but was leading a sedentary lifestyle. Echo was ordered for further evaluation of murmur heard on exam and showed LVEF of 50-55% with normal wall motion, mild LVH but normal diastolic parameters, and no significant valvular disease.   Patient presented to the [facility] ED on 03/30/2021 with intermittent episodes of  atypical left sided chest pain that occurred at rest and felt like someone "sticking their finger in his left chest." Episodes very brief but kept occurring which is why patient decided to come to the ED. He also noted some lightheadedness in the ED. EKG showed no acute changes. Initial Troponin I was negative but repeat came back at 0.13 >> 0.15 >> 0.18 >> 0.20. Therefore, he was  admitted for ACS rule out. He was seen by Dr. [name] who recommended transfer to [facility] for cardiac catheterization.  Saw patient in the Cath Lab holding area.  He is currently chest pain-free.  He states he was in his usual state of health until 03/30/2021 when he started having intermittent left-sided chest pain as described above.  Discomfort did not last very long; however, he did have some left arm numbness with this as well as a little bit of dizziness so decided to come to the ED for further evaluation.  No associated shortness of breath, nausea/vomiting, diaphoresis.  He states this feels felt very different than prior cardiac pain.  Each time he needed a stent in the past, he reportedly had SVT.  No palpitations with these episodes.  No syncope. No other chest pain. No shortness of breath.  He states he had a mild nonproductive cough with the above episode of chest pain but no recent fevers or illnesses.  No abnormal bleeding.  States he has not had any recurrent chest pain since the evening of 03/30/2021.  Labs at [place]: - CBC on 03/30/2021: WBC 9.1, Hgb 15.0, Plts 201. - CMET on 03/30/2021: Na 128, K 3.8, Glucose 272, BUN 20, Cr 1.20, Albumin 4.0, AST 30, ALT 27, Alk Phos 117, Total Bili 0.7 - BMET on 04/02/2021: Na 137, K 3.8, Glucose 138, BUN 15, Cr 1.10.  Past Medical History:  Diagnosis Date   CAD in native artery 03/10/2015   Cath 2016:  100% mid Circumflex 90% Om ---> 0%  with stent Mid RCA restenosis treated with Angiosculpt Moderate mid LAD   Capsulitis of metatarsophalangeal (MTP) joint of right foot 06/11/2016   Contusion of right heel 06/09/2017   Diabetes mellitus due to underlying condition with unspecified complications ([place]) 70/96/2836   Dizzy spells    Dyslipidemia 03/10/2015   Essential hypertension 03/10/2015   Foot pain, bilateral 09/29/2020   Ingrown nail 07/28/2018   Injury of toenail of left foot 06/21/2016   Loose toenail 06/21/2016   Morbid obesity with BMI of 45.0-49.9,  adult ([place]) 03/10/2015   Nondisplaced fracture of body of right calcaneus, initial encounter for closed fracture 06/09/2017   OSA on CPAP 02/24/2017   Plantar callus 05/13/2019   Right foot pain 06/09/2017   SOB (shortness of breath) 10/01/2017   SVT (supraventricular tachycardia) ([place]) 02/21/2017   Uncontrolled type 2 diabetes mellitus without complication, with long-term current use of insulin 06/11/2016    Past Surgical History:  Procedure Laterality Date   LEFT HEART CATH AND CORONARY ANGIOGRAPHY N/A 10/06/2017   Procedure: LEFT HEART CATH AND CORONARY ANGIOGRAPHY;  Surgeon: Belva Crome, MD;  Location: [place] CV [facility];  Service: Cardiovascular;  Laterality: N/A;     Medications Prior to Admission: Prior to Admission medications   Medication Sig Start Date End Date Taking? Authorizing Provider  amLODipine (NORVASC) 10 MG tablet Take 10 mg by mouth daily.  09/27/17   [provider]  aspirin EC 81 MG tablet Take 81 mg by mouth daily.  02/24/15   [provider]  budesonide-formoterol (SYMBICORT) 160-4.5 MCG/ACT inhaler Inhale 2 puffs into the lungs daily.  04/21/17   [provider]  carvedilol (COREG) 25 MG tablet Take 25 mg by mouth 2 (two) times daily. 09/03/17   [provider]  colesevelam (WELCHOL) 625 MG tablet Take 1,875 mg by mouth 2 (two) times daily with a meal.     [provider]  famotidine (PEPCID) 20 MG tablet Take 20 mg by mouth daily.    [provider]  hydrALAZINE (APRESOLINE) 25 MG tablet Take 25 mg by mouth 3 (three) times daily. 09/08/17   [provider]  liraglutide (VICTOZA) 18 MG/3ML SOPN Inject 1.8 mg into the skin daily. 06/06/17   [provider]  nitroGLYCERIN (NITROSTAT) 0.4 MG SL tablet Place 0.4 mg under the tongue every 5 (five) minutes as needed for chest pain.    [provider]  pioglitazone-metformin (ACTOPLUS MET) 15-850 MG tablet Take 1 tablet by mouth 2 (two) times daily with  a meal.     [provider]  rosuvastatin (CRESTOR) 40 MG tablet Take 40 mg by mouth daily. 08/06/17   [provider]  TOUJEO SOLOSTAR 300 UNIT/ML SOPN Inject 80 Units into the skin daily. 08/08/17   [provider]  valsartan (DIOVAN) 320 MG tablet Take 320 mg by mouth daily. 02/01/21   [provider]     Allergies:    Allergies  Allergen Reactions   Codeine Shortness Of Breath    "I turn bright red and have difficulty breathing"   Hydrocodone-Acetaminophen Shortness Of Breath    "I turn red and have difficulty breathing"    Social History:   Social History   Socioeconomic History   Marital status: Single    Spouse name: Not on file   Number of children: Not on file   Years of education: Not on file   Highest education level: Not on file  Occupational History   Not on file  Tobacco Use   Smoking status: Former   Smokeless tobacco: Former  Substance and Sexual Activity   Alcohol use: Yes    Alcohol/week: 1.0 standard drink    Types: 1 Standard drinks or equivalent per week   Drug use: Not on file   Sexual activity: Not on file  Other Topics Concern   Not on file  Social History Narrative   Not on file   Social Determinants of Health   Financial Resource Strain: Not on file  Food Insecurity: Not on file  Transportation Needs: Not on file  Physical Activity: Not on file  Stress: Not on file  Social Connections: Not on file  Intimate Partner Violence: Not on file    Family History:  The patient's family history includes Cancer in his mother; Heart disease in his father; Stroke in his mother.    ROS:  Please see the history of present illness.  All other ROS reviewed and negative.     Physical Exam/Data:   Vitals:   04/02/21 1331  BP: (!) 168/79  Pulse: 81  Resp: 20  SpO2: 98%   No intake or output data in the 24 hours ending 04/02/21 1400 Last 3 Weights 02/15/2021 09/25/2018 10/29/2017  Weight (lbs) 373 lb 12.8 oz 373 lb  365 lb  Weight (kg) 169.555 kg 169.192 kg 165.563 kg     There is no height or weight on file to calculate BMI.  General: 55 y.o. male resting comfortably in no acute distress. HEENT: Normocephalic and atraumatic. Sclera clear.  Neck: Supple. JVD difficult to assess due to body habitus. Heart: RRR. Distinct S1 and S2. No murmurs, gallops, or rubs. Radial pulses 2+ and equal bilaterally. Lungs: No increased work of breathing. Clear to ausculation bilaterally. No wheezes, rhonchi, or rales.  Abdomen: Soft, non-distended, and non-tender to palpation.  MSK: Normal strength and tone for age. Extremities: No lower extremity edema.    Skin: Warm and dry. Neuro: Alert and oriented x3. No focal deficits. Psych: Normal affect. Responds appropriately.  EKG:  The ECG that was done was personally reviewed and demonstrates normal sinus rhythm, rate 87 bpm, with 1st degree AV block, and no acute ST/T changes. Interpretation difficult due to quality of scanned document.  Relevant CV Studies:  Cardiac Catheterization 10/06/2017: Difficult procedure due to obesity preventing optimal visualization for lesion assessment. Widely patent left main Dominant LAD that reaches the left ventricular apex.  Eccentric 50-70% mid LAD lesion. Total occlusion of circumflex after the first obtuse marginal.  First obtuse marginal stent is widely patent. Multiple overlapping stents in the right coronary from proximal to distal.  In the mid right coronary there is a region of restenosis in the 50-60% range. Left ventriculography is suboptimal.  Anterior wall motion is normal.  Inferior wall is not visible.  End-diastolic pressure 14 mmHg. Atypical symptoms of sudden spells of dyspnea and alternatively episodes of dizziness (these complaints do not occur together).  There are no exertional components.   Recommendations: Moderate to moderately severe mid LAD and mid RCA (restenosis).  Disease in these areas appears less than  70% obstructed.  In absence of exertional complaints, coronary disease can be treated with risk factor modification and medical therapy.  If patient develops angina, LAD could be stented and RCA restenosis re-dilated. Watchful waiting.  Diagnostic Dominance: Right   _______________  Echocardiogram 03/07/2021: Impressions:  1. Left ventricular ejection fraction, by estimation, is 50 to 55%. The  left ventricle has low normal function. The  left ventricle has no regional  wall motion abnormalities. There is mild concentric left ventricular  hypertrophy. Left ventricular  diastolic parameters were normal.   2. Right ventricular systolic function is normal. The right ventricular  size is normal. There is normal pulmonary artery systolic pressure.   3. The mitral valve is normal in structure. No evidence of mitral valve  regurgitation. No evidence of mitral stenosis.   4. The aortic valve is tricuspid. Aortic valve regurgitation is not  visualized. No aortic stenosis is present.   Laboratory Data:  High Sensitivity Troponin:  No results for input(s): TROPONINIHS in the last 720 hours.    ChemistryNo results for input(s): NA, K, CL, CO2, GLUCOSE, BUN, CREATININE, CALCIUM, GFRNONAA, GFRAA, ANIONGAP in the last 168 hours.  No results for input(s): PROT, ALBUMIN, AST, ALT, ALKPHOS, BILITOT in the last 168 hours. HematologyNo results for input(s): WBC, RBC, HGB, HCT, MCV, MCH, MCHC, RDW, PLT in the last 168 hours. BNPNo results for input(s): BNP, PROBNP in the last 168 hours.  DDimer No results for input(s): DDIMER in the last 168 hours.   Radiology/Studies:  No results found.   Assessment and Plan:   Chest Pain Elevated Troponin History of CAD s/p Multiple PCIs - Patient presented to [facility] with very atypical sounding chest pain. EKG was non-acute. Troponin I initially negative but repeat positive at 0.13 >> 0.15 >> 0.18 >> 0.20. Given known CAD, decision made to transfer  patient to [facility] for cardiac catheterization. - Patient chest pain free. - Continue aspirin, beta-blocker, and high-intensity statin. - Plan is for cardiac catheterization. The patient understands that risks include but are not limited to stroke (1 in 1000), death (1 in 10), kidney failure [usually temporary] (1 in 500), bleeding (1 in 200), allergic reaction [possibly serious] (1 in 200), and agrees to proceed.   Hypertension - BP elevated. - Continue home medications: Amlodipine 44m daily, Coreg 246mtwice daily, Valsartan 32029maily, and Hydralazine 68m90mree times daily. - If BP remains elevated, consider adding Spironolactone.  [facility], Triglycerides 92, HDL 41, LDL 89.6.  - LDL goal <70 given CAD. - Continue Crestor 40mg56mly.  Type 2 Diabetes Mellitus - Per Dr. RevanJulien Nordmannnt outpatient note in 01/2021, hemoglobin A1c >10. - Will check hemoglobin A1c.  - On Pioglitazone-Metformin, Liraglutide, and Insulin at home. Will hold home medications and start sliding scale insulin.  Risk Assessment/Risk Scores:   TIMI Risk Score for Unstable Angina or Non-ST Elevation MI:   The patient's TIMI risk score is 4, which indicates a 20% risk of all cause mortality, new or recurrent myocardial infarction or need for urgent revascularization in the next 14 days.{  Severity of Illness: The appropriate patient status for this patient is OBSERVATION. Observation status is judged to be reasonable and necessary in order to provide the required intensity of service to ensure the patient's safety. The patient's presenting symptoms, physical exam findings, and initial radiographic and laboratory data in the context of their medical condition is felt to place them at decreased risk for further clinical deterioration. Furthermore, it is anticipated that the patient will be medically stable for discharge from the hospital within 2 midnights of  admission. The following factors support the patient status of observation.   " The patient's presenting symptoms include chest pain. " The physical exam findings as above. " The initial radiographic and laboratory data showed minimally elevated troponin.  For questions or updates, please  contact [place] Please consult [email or web address] for contact info under     Signed, Darreld Mclean, PA-C  04/02/2021 2:00 PM

## 2021-04-02 NOTE — Progress Notes (Signed)
CPAP in pt's room, pt stated he wanted to hold off on wearing CPAP.  Pt instructed to have RN call RT if he changed his mind.

## 2021-04-02 NOTE — Progress Notes (Signed)
Per Pt, personal glucose monitor CBG at 100

## 2021-04-03 ENCOUNTER — Encounter (HOSPITAL_COMMUNITY): Payer: Self-pay | Admitting: Cardiology

## 2021-04-03 ENCOUNTER — Inpatient Hospital Stay (HOSPITAL_COMMUNITY): Payer: 59

## 2021-04-03 ENCOUNTER — Other Ambulatory Visit (HOSPITAL_COMMUNITY): Payer: Self-pay

## 2021-04-03 DIAGNOSIS — E785 Hyperlipidemia, unspecified: Secondary | ICD-10-CM

## 2021-04-03 DIAGNOSIS — I249 Acute ischemic heart disease, unspecified: Secondary | ICD-10-CM

## 2021-04-03 DIAGNOSIS — E669 Obesity, unspecified: Secondary | ICD-10-CM

## 2021-04-03 DIAGNOSIS — I25110 Atherosclerotic heart disease of native coronary artery with unstable angina pectoris: Secondary | ICD-10-CM

## 2021-04-03 DIAGNOSIS — I214 Non-ST elevation (NSTEMI) myocardial infarction: Secondary | ICD-10-CM

## 2021-04-03 DIAGNOSIS — Z01810 Encounter for preprocedural cardiovascular examination: Secondary | ICD-10-CM | POA: Diagnosis not present

## 2021-04-03 LAB — CBC
HCT: 44.5 % (ref 39.0–52.0)
Hemoglobin: 15 g/dL (ref 13.0–17.0)
MCH: 31.3 pg (ref 26.0–34.0)
MCHC: 33.7 g/dL (ref 30.0–36.0)
MCV: 92.7 fL (ref 80.0–100.0)
Platelets: 196 10*3/uL (ref 150–400)
RBC: 4.8 MIL/uL (ref 4.22–5.81)
RDW: 13.7 % (ref 11.5–15.5)
WBC: 7.3 10*3/uL (ref 4.0–10.5)
nRBC: 0 % (ref 0.0–0.2)

## 2021-04-03 LAB — COMPREHENSIVE METABOLIC PANEL
ALT: 27 U/L (ref 0–44)
AST: 28 U/L (ref 15–41)
Albumin: 3.5 g/dL (ref 3.5–5.0)
Alkaline Phosphatase: 49 U/L (ref 38–126)
Anion gap: 6 (ref 5–15)
BUN: 14 mg/dL (ref 6–20)
CO2: 26 mmol/L (ref 22–32)
Calcium: 8.9 mg/dL (ref 8.9–10.3)
Chloride: 104 mmol/L (ref 98–111)
Creatinine, Ser: 1.18 mg/dL (ref 0.61–1.24)
GFR, Estimated: >60 mL/min (ref >60)
Glucose, Bld: 131 mg/dL — ABNORMAL HIGH (ref 70–99)
Potassium: 4 mmol/L (ref 3.5–5.1)
Sodium: 136 mmol/L (ref 135–145)
Total Bilirubin: 1.4 mg/dL — ABNORMAL HIGH (ref 0.3–1.2)
Total Protein: 6.3 g/dL — ABNORMAL LOW (ref 6.5–8.1)

## 2021-04-03 LAB — HEPARIN LEVEL (UNFRACTIONATED)
Heparin Unfractionated: 0.17 IU/mL — ABNORMAL LOW (ref 0.30–0.70)
Heparin Unfractionated: 0.21 IU/mL — ABNORMAL LOW (ref 0.30–0.70)
Heparin Unfractionated: 0.53 IU/mL (ref 0.30–0.70)

## 2021-04-03 LAB — GLUCOSE, CAPILLARY
Glucose-Capillary: 121 mg/dL — ABNORMAL HIGH (ref 70–99)
Glucose-Capillary: 147 mg/dL — ABNORMAL HIGH (ref 70–99)
Glucose-Capillary: 123 mg/dL — ABNORMAL HIGH (ref 70–99)
Glucose-Capillary: 111 mg/dL — ABNORMAL HIGH (ref 70–99)

## 2021-04-03 MED ORDER — HYDRALAZINE HCL 50 MG PO TABS
50.0000 mg | ORAL_TABLET | Freq: Three times a day (TID) | ORAL | Status: DC
  Administered 2021-04-03 – 2021-04-04 (×5): 50 mg via ORAL
  Filled 2021-04-03 (×5): qty 1

## 2021-04-03 MED ORDER — LABETALOL HCL 5 MG/ML IV SOLN: 10.0000 mg | INTRAVENOUS | Status: DC | PRN

## 2021-04-03 MED ORDER — DAPAGLIFLOZIN PROPANEDIOL 10 MG PO TABS
10.0000 mg | ORAL_TABLET | Freq: Every day | ORAL | Status: DC
  Administered 2021-04-03 – 2021-04-04 (×2): 10 mg via ORAL
  Filled 2021-04-03 (×3): qty 1

## 2021-04-03 NOTE — Progress Notes (Signed)
ANTICOAGULATION CONSULT NOTE - Follow Up Consult  Pharmacy Consult for heparin Indication:  CAD awaiting CABG consult  Labs: Recent Labs    04/02/21 1851 04/03/21 0618 04/03/21 1359 04/03/21 2154  HGB 16.0 15.0  --   --   HCT 46.0 44.5  --   --   PLT 235 196  --   --   HEPARINUNFRC  --  0.17* 0.21* 0.53  CREATININE 1.13 1.18  --   --     Assessment/Plan:  55yo male therapeutic on heparin after rate change. Will continue infusion at current rate of 2150 units/hr and confirm stable with additional level.   Vernard Gambles, PharmD, BCPS  04/03/2021,11:31 PM

## 2021-04-03 NOTE — Consult Note (Signed)
[address]       [place] [address]             [phone]        Kevin Lawrence Medical Record #[id number] Date of Birth: April 04, 1966  Referring: No ref. provider found Primary Care: Helen Hashimoto., MD Primary Cardiologist:Rajan Reuel Derby, MD  Chief Complaint:   No chief complaint on file.   History of Present Illness:      Kevin Lawrence is a 55 year old history of CAD s/p multiple overlapping stents of the RCA and stenting to OM1 with total occlusion of LCX on cardiac catheterization in 2019, paroxysmal SVT, hypetension, dyslipidemia, uncontrolled type 2 diabetes mellitus, morbid obesity, and a remote history of smoking quitting about 20 years ago who presented to [facility] for chest pain. He was having atypical chest pain which occurred at rest which felt like "someone was sticking their finger in his left chest". The episodes were brief but kept reoccurring which is why the patient came to the ED. Troponin was mildly elevated. Dr. [name] saw him and recommended transfer to [facility] for cardiac cath. Cardiac cath showed 50% stenosis of the LAD followed by proximal LAD lesion of 70%. His first LPL is 100% stenosed, proximal circumflex to mid circumflex is 95% just prior to the stent, proximal RCA lesion is 90% stenosed just prior to the long stented segment,  and the proximal RCA 2 stent is 20% stenosed followed by mid RCA lesion of 70% stenosis. LVEF is estimated to be 50-55%. Cardiology consulted [place] for possible surgical revascularization.   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 He lives at home by himself with his 2 cats. He also has a strong family history on his father's side of coronary disease and his sister died after CABG. She had previously had 11 stents  placed. For this reason he is nervous about undergoing CABG.    Current Activity/ Functional Status: Patient was independent with mobility/ambulation, transfers, ADL's, IADL's.   Zubrod Score: At the time of surgery this patient's most appropriate activity status/level should be described as: _0     0    Normal activity, no symptoms _1     1    Restricted in physical strenuous activity but ambulatory, able to do out light work _2     2    Ambulatory and capable of self care, unable to do work activities, up and about                 more than 50%  Of the time                            _3     3    Only limited self care, in bed greater than 50% of waking hours _4     4    Completely disabled, no self care, confined to bed or chair _5     5    Moribund  Past Medical History:  Diagnosis Date   CAD in native artery 03/10/2015   Cath 2016:  100% mid Circumflex 90% Om ---> 0% with stent Mid RCA restenosis treated with Angiosculpt Moderate mid LAD   Capsulitis of metatarsophalangeal (MTP) joint of  right foot 06/11/2016   Contusion of right heel 06/09/2017   Diabetes mellitus due to underlying condition with unspecified complications ([place]) 81/27/5170   Dizzy spells    Dyslipidemia 03/10/2015   Essential hypertension 03/10/2015   Foot pain, bilateral 09/29/2020   Ingrown nail 07/28/2018   Injury of toenail of left foot 06/21/2016   Loose toenail 06/21/2016   Morbid obesity with BMI of 45.0-49.9, adult ([place]) 03/10/2015   Nondisplaced fracture of body of right calcaneus, initial encounter for closed fracture 06/09/2017   OSA on CPAP 02/24/2017   Plantar callus 05/13/2019   Right foot pain 06/09/2017   SOB (shortness of breath) 10/01/2017   SVT (supraventricular tachycardia) ([name]) 02/21/2017   Uncontrolled type 2 diabetes mellitus without complication, with long-term current use of insulin 06/11/2016    Past Surgical History:  Procedure Laterality Date   INTRAVASCULAR PRESSURE WIRE/FFR STUDY N/A 04/02/2021    Procedure: INTRAVASCULAR PRESSURE WIRE/FFR STUDY;  Surgeon: Leonie Man, MD;  Location: [place] [facility];  Service: Cardiovascular;  Laterality: N/A;   LEFT HEART CATH AND CORONARY ANGIOGRAPHY N/A 10/06/2017   Procedure: LEFT HEART CATH AND CORONARY ANGIOGRAPHY;  Surgeon: Belva Crome, MD;  Location: [place] [facility];  Service: Cardiovascular;  Laterality: N/A;   LEFT HEART CATH AND CORONARY ANGIOGRAPHY N/A 04/02/2021   Procedure: LEFT HEART CATH AND CORONARY ANGIOGRAPHY;  Surgeon: Leonie Man, MD;  Location: [place] [facility];  Service: Cardiovascular;  Laterality: N/A;    Social History   Tobacco Use  Smoking Status Former  Smokeless Tobacco Former    Social History   Substance and Sexual Activity  Alcohol Use Yes   Alcohol/week: 1.0 standard drink   Types: 1 Standard drinks or equivalent per week     Allergies  Allergen Reactions   Codeine Shortness Of Breath    "I turn bright red and have difficulty breathing"   Hydrocodone-Acetaminophen Shortness Of Breath    "I turn red and have difficulty breathing"    Current Facility-Administered Medications  Medication Dose Route Frequency Provider Last Rate Last Admin   0.9 %  sodium chloride infusion  250 mL Intravenous PRN Leonie Man, MD       acetaminophen (TYLENOL) tablet 650 mg  650 mg Oral Q4H PRN Sande Rives E, PA-C       amLODipine (NORVASC) tablet 10 mg  10 mg Oral Daily Leonie Man, MD   10 mg at 04/03/21 0846   aspirin EC tablet 81 mg  81 mg Oral Daily Leonie Man, MD   81 mg at 04/03/21 0846   carvedilol (COREG) tablet 25 mg  25 mg Oral BID Leonie Man, MD   25 mg at 04/03/21 0846   colesevelam [facility]) tablet 1,875 mg  1,875 mg Oral BID WC Leonie Man, MD   1,875 mg at 04/03/21 0857   dapagliflozin propanediol (FARXIGA) tablet 10 mg  10 mg Oral Daily Reino Bellis B, NP       famotidine (PEPCID) tablet 20 mg  20 mg Oral Daily Leonie Man, MD       heparin ADULT  infusion 100 units/mL (25000 units/265m)  1,900 Units/hr Intravenous Continuous BLaren Everts RPH 19 mL/hr at 04/03/21 0701 1,900 Units/hr at 04/03/21 0701   hydrALAZINE (APRESOLINE) tablet 50 mg  50 mg Oral TID RReino BellisB, NP       insulin aspart (novoLOG) injection 0-15 Units  0-15 Units Subcutaneous TID WC GDarreld Mclean  PA-C   2 Units at 04/03/21 0844   insulin glargine-yfgn [facility]) injection 40 Units  40 Units Subcutaneous Daily Einar Grad, RPH   40 Units at 04/03/21 0845   irbesartan (AVAPRO) tablet 300 mg  300 mg Oral Daily Leonie Man, MD   300 mg at 04/03/21 0846   labetalol (NORMODYNE) injection 10-20 mg  10-20 mg Intravenous Q2H PRN Cheryln Manly, NP       mometasone-formoterol (DULERA) 200-5 MCG/ACT inhaler 2 puff  2 puff Inhalation BID Leonie Man, MD   2 puff at 04/03/21 0806   nitroGLYCERIN (NITROSTAT) SL tablet 0.4 mg  0.4 mg Sublingual Q5 Min x 3 PRN Darreld Mclean, PA-C       ondansetron [facility]) injection 4 mg  4 mg Intravenous Q6H PRN Leonie Man, MD       rosuvastatin (CRESTOR) tablet 40 mg  40 mg Oral Daily Leonie Man, MD   40 mg at 04/03/21 0845   sodium chloride flush (NS) 0.9 % injection 3 mL  3 mL Intravenous Q12H Leonie Man, MD   3 mL at 04/02/21 2303   sodium chloride flush (NS) 0.9 % injection 3 mL  3 mL Intravenous PRN Leonie Man, MD        Medications Prior to Admission  Medication Sig Dispense Refill Last Dose   amLODipine (NORVASC) 10 MG tablet Take 10 mg by mouth daily.   4 Past Week   aspirin EC 81 MG tablet Take 81 mg by mouth daily.    Past Week   budesonide-formoterol (SYMBICORT) 160-4.5 MCG/ACT inhaler Inhale 2 puffs into the lungs daily as needed (asthma).   unknown   carvedilol (COREG) 25 MG tablet Take 25 mg by mouth 2 (two) times daily.  5 Past Week   colesevelam (WELCHOL) 625 MG tablet Take 1,875 mg by mouth 2 (two) times daily with a meal.    Past Week   famotidine (PEPCID) 20 MG  tablet Take 20 mg by mouth daily as needed for heartburn.   unknown   hydrALAZINE (APRESOLINE) 25 MG tablet Take 25 mg by mouth 3 (three) times daily.  2 04/02/2021   liraglutide (VICTOZA) 18 MG/3ML SOPN Inject 1.8 mg into the skin daily.   04/01/2021   nitroGLYCERIN (NITROSTAT) 0.4 MG SL tablet Place 0.4 mg under the tongue every 5 (five) minutes as needed for chest pain.   unknown   pioglitazone-metformin (ACTOPLUS MET) 15-850 MG tablet Take 1 tablet by mouth 2 (two) times daily with a meal.    Past Week   rosuvastatin (CRESTOR) 40 MG tablet Take 40 mg by mouth daily.  3 Past Week   TOUJEO SOLOSTAR 300 UNIT/ML SOPN Inject 80 Units into the skin daily.  3 04/02/2021   valsartan (DIOVAN) 320 MG tablet Take 320 mg by mouth daily.   04/01/2021    Family History  Problem Relation Age of Onset   Cancer Mother    Stroke Mother    Heart disease Father      Review of Systems:   Review of Systems  Constitutional:  Positive for malaise/fatigue.  Respiratory:  Positive for cough and shortness of breath (with activity).   Cardiovascular:  Positive for chest pain ("poking in his left chest") and leg swelling.  Musculoskeletal:  Positive for joint pain (left knee injury).  Psychiatric/Behavioral:  The patient is nervous/anxious.   Pertinent items are noted in HPI.      Physical Exam: BP Marland Kitchen)  178/108 (BP Location: Left Arm)   Pulse 78   Temp 98.2 F (36.8 C) (Oral)   Resp 20   Ht _0  (1.905 m)   Wt (!) 172.4 kg   SpO2 94%   BMI 47.50 kg/m    General appearance: alert, cooperative, and no distress Resp: clear to auscultation bilaterally Cardio: regular rate and rhythm, S1, S2 normal, no murmur, click, rub or gallop GI: soft, non-tender; bowel sounds normal; no masses,  no organomegaly Extremities: extremities normal, atraumatic, no cyanosis or edema Neurologic: Grossly normal  Diagnostic Studies & Laboratory data:     Recent Radiology Findings:   CARDIAC CATHETERIZATION  Result Date:  04/02/2021 Formatting of this result is different from the original.   Ost LAD lesion is 50% stenosed. followed by Prox LAD lesion is 70% stenosed. - RFR POSITIVE (0.86)   1st LPL lesion is 100% stenosed. (Known to be occluded - on previous report noted as Mid LCx   Prox Cx to Mid Cx(-2nd Mrg) lesion is 95% stenosed just priot to stent.   Prox RCA-1 lesion is 90% stenosed just priot to long stented segment.   Prox RCA-2 Stent is 20% stenosed, followed by Mid RCA lesion is 70% stenosis   The left ventricular systolic function is normal.  The left ventricular ejection fraction is 50-55% by visual estimate.   LV end diastolic pressure is normal. SUMMARY Severe Three-Vessel CAD: Tandem 50 and 70% focal stenosis in the proximal LAD (RFR positive at 0.86) Mid LCx (2nd Mrg) 95% focal stenosis at the proximal edge of previously placed stent; this is just after the takeoff of the AV groove branch with known occlusion of LPL branch. Proximal RCA 90% stenosis just prior to stent with 70% ISR in the mid portion of the RCA stent Normal LVEDP, preserved LVEF RECOMMENDATIONS With three-vessel disease 2 of which involve in-stent restenosis/proximal and stenosis as well as now RFR positive LAD disease, recommend CABG. Will admit to inpatient for stabilization and CVTS consultation. Restart IV heparin 8 hours after sheath removal Continue aggressive GM DT medical management for CAD/ACS Glenetta Hew, MD    I have independently reviewed the above radiologic studies and discussed with the patient   Recent Lab Findings: Lab Results  Component Value Date   WBC 7.3 04/03/2021   HGB 15.0 04/03/2021   HCT 44.5 04/03/2021   PLT 196 04/03/2021   GLUCOSE 131 (H) 04/03/2021   ALT 27 04/03/2021   AST 28 04/03/2021   NA 136 04/03/2021   K 4.0 04/03/2021   CL 104 04/03/2021   CREATININE 1.18 04/03/2021   BUN 14 04/03/2021   CO2 26 04/03/2021   INR 1.0 10/01/2017   HGBA1C 6.4 (H) 04/02/2021      Assessment / Plan:       NSTEMI/chest pain- He is on aspirin/statin/bb and heparin gtt HTN-On home Norvasc, Coreg, Hydralazine, and Avapro HLD-On statin therapy DM-On [place]ade and Insulin. Recent A1C 6.4 pSVT-Happened with his first 2 MIs. No reoccurrence since BB was started.  Morbid obesity-Counciled on lifestyle changes and patient seems motivated to make some changes. Would encourage cardiac rehab and a strict heart healthy diet post surgery. Previous tobacco use  Plan: To be evaluated by Dr. Kipp Brood and cardiac cath reviewed. Coronary artery bypass grafting discussed with the patient in detail and all questions answered to the patient's satisfaction. He does live alone so we discussed him having someone (family or friends) stay with him for the first week of being home.  I  spent 55 minutes counseling the patient face to face.   Nicholes Rough, PA-C  04/03/2021 11:41 AM

## 2021-04-03 NOTE — TOC Benefit Eligibility Note (Signed)
Patient Product/process development scientist completed.    The patient is currently admitted and upon discharge could be taking Farxiga 10 mg.  The current 30 day co-pay is, $15.00.   The patient is currently admitted and upon discharge could be taking Jardiance 10 mg.  The current 30 day co-pay is, $30.00.   The patient is insured through [employer]     Roland Earl, CPhT Pharmacy Patient Advocate Specialist [facility] Antimicrobial Stewardship Team Direct Number: [phone]  Fax: [phone]

## 2021-04-03 NOTE — Progress Notes (Signed)
Pt visiting with family, declined ambulation or education at this time. Left materials for pt to begin reviewing. Will f/u tomorrow. [phone] Ethelda Chick CES, ACSM 2:49 PM 04/03/2021

## 2021-04-03 NOTE — Progress Notes (Signed)
Pt states he doesn't want to wear CPAP for the night.  

## 2021-04-03 NOTE — Progress Notes (Signed)
ANTICOAGULATION CONSULT NOTE  Pharmacy Consult for heparin Indication: chest pain/ACS  Allergies  Allergen Reactions   Codeine Shortness Of Breath    "I turn bright red and have difficulty breathing"   Hydrocodone-Acetaminophen Shortness Of Breath    "I turn red and have difficulty breathing"    Patient Measurements: Height: 6\' 3"  (190.5 cm) Weight: (!) 172.4 kg (380 lb) IBW/kg (Calculated) : 84.5 Heparin Dosing Weight: 125kg  Vital Signs: Temp: 98.2 F (36.8 C) (08/09 0814) Temp Source: Oral (08/09 0814) BP: 146/95 (08/09 1314) Pulse Rate: 76 (08/09 1314)  Labs: Recent Labs    04/02/21 1851 04/03/21 0618 04/03/21 1359  HGB 16.0 15.0  --   HCT 46.0 44.5  --   PLT 235 196  --   HEPARINUNFRC  --  0.17* 0.21*  CREATININE 1.13 1.18  --     Estimated Creatinine Clearance: 116.9 mL/min (by C-G formula based on SCr of 1.18 mg/dL).   Medical History: Past Medical History:  Diagnosis Date   CAD in native artery 03/10/2015   Cath 2016:  100% mid Circumflex 90% Om ---> 0% with stent Mid RCA restenosis treated with Angiosculpt Moderate mid LAD   Capsulitis of metatarsophalangeal (MTP) joint of right foot 06/11/2016   Contusion of right heel 06/09/2017   Diabetes mellitus due to underlying condition with unspecified complications ([facility]) 06/19/2015   Dizzy spells    Dyslipidemia 03/10/2015   Essential hypertension 03/10/2015   Foot pain, bilateral 09/29/2020   Ingrown nail 07/28/2018   Injury of toenail of left foot 06/21/2016   Loose toenail 06/21/2016   Morbid obesity with BMI of 45.0-49.9, adult ([facility]) 03/10/2015   Nondisplaced fracture of body of right calcaneus, initial encounter for closed fracture 06/09/2017   OSA on CPAP 02/24/2017   Plantar callus 05/13/2019   Right foot pain 06/09/2017   SOB (shortness of breath) 10/01/2017   SVT (supraventricular tachycardia) ([facility]) 02/21/2017   Uncontrolled type 2 diabetes mellitus without complication, with long-term current use of  insulin 06/11/2016     Assessment: 55 yoM admitted from OSH with CP s/p LHC with mvCAD. Pharmacy dosing heparin while undergoing CABG workup.  No AC PTA. -heparin level below goal on 1900 units/hr -CBC stable  Goal of Therapy:  Heparin level 0.3-0.7 units/ml Monitor platelets by anticoagulation protocol: Yes   Plan:  -Increase heparin to 2150 units/hr -Heparin level in 6 hours and daily wth CBC daily  2151, PharmD Clinical Pharmacist **Pharmacist phone directory can now be found on [email or web address] ([email or web address]).  Listed under [facility].

## 2021-04-03 NOTE — Progress Notes (Addendum)
Progress Note  Patient Name: Kevin Lawrence Date of Encounter: 04/03/2021  [facility] Cardiologist: Garwin Brothers, MD   Subjective   No chest pain overnight. Pending TCTS eval  Inpatient Medications    Scheduled Meds:  amLODipine  10 mg Oral Daily   aspirin EC  81 mg Oral Daily   carvedilol  25 mg Oral BID   colesevelam  1,875 mg Oral BID WC   famotidine  20 mg Oral Daily   hydrALAZINE  25 mg Oral TID   insulin aspart  0-15 Units Subcutaneous TID WC   insulin glargine-yfgn  40 Units Subcutaneous Daily   irbesartan  300 mg Oral Daily   mometasone-formoterol  2 puff Inhalation BID   rosuvastatin  40 mg Oral Daily   sodium chloride flush  3 mL Intravenous Q12H   Continuous Infusions:  sodium chloride     heparin 1,900 Units/hr (04/03/21 0701)   PRN Meds: sodium chloride, acetaminophen, nitroGLYCERIN, ondansetron (ZOFRAN) IV, sodium chloride flush   Vital Signs    Vitals:   04/03/21 0513 04/03/21 0710 04/03/21 0807 04/03/21 0814  BP: (!) 169/83 (!) 169/89  (!) 178/108  Pulse: 64 71  78  Resp: 13 16  20   Temp: 97.9 F (36.6 C)   98.2 F (36.8 C)  TempSrc: Oral   Oral  SpO2: (!) 88% 97% 100% 94%  Weight:      Height: 6\' 3"  (1.905 m)       Intake/Output Summary (Last 24 hours) at 04/03/2021 0843 Last data filed at 04/03/2021 0800 Gross per 24 hour  Intake 360 ml  Output 1550 ml  Net -1190 ml   Last 3 Weights 04/02/2021 02/15/2021 09/25/2018  Weight (lbs) 380 lb 373 lb 12.8 oz 373 lb  Weight (kg) 172.367 kg 169.555 kg 169.192 kg      Telemetry    SR - Personally Reviewed  ECG    SR, 1st degree AVB, PACs - Personally Reviewed  Physical Exam   GEN: No acute distress.   Neck: No JVD Cardiac: RRR, no murmurs, rubs, or gallops.  Respiratory: Clear to auscultation bilaterally. GI: Soft, nontender, non-distended  MS: No edema; No deformity. Right radial site stable. Neuro:  Nonfocal  Psych: Normal affect   Labs    High Sensitivity Troponin:  No  results for input(s): TROPONINIHS in the last 720 hours.    Chemistry Recent Labs  Lab 04/02/21 1851 04/03/21 0618  NA  --  136  K  --  4.0  CL  --  104  CO2  --  26  GLUCOSE  --  131*  BUN  --  14  CREATININE 1.13 1.18  CALCIUM  --  8.9  PROT  --  6.3*  ALBUMIN  --  3.5  AST  --  28  ALT  --  27  ALKPHOS  --  49  BILITOT  --  1.4*  GFRNONAA >60 >60  ANIONGAP  --  6     Hematology Recent Labs  Lab 04/02/21 1851 04/03/21 0618  WBC 9.9 7.3  RBC 5.03 4.80  HGB 16.0 15.0  HCT 46.0 44.5  MCV 91.5 92.7  MCH 31.8 31.3  MCHC 34.8 33.7  RDW 13.9 13.7  PLT 235 196    BNPNo results for input(s): BNP, PROBNP in the last 168 hours.   DDimer No results for input(s): DDIMER in the last 168 hours.   Radiology    CARDIAC CATHETERIZATION  Result Date: 04/02/2021  Formatting of this result is different from the original.   Ost LAD lesion is 50% stenosed. followed by Prox LAD lesion is 70% stenosed. - RFR POSITIVE (0.86)   1st LPL lesion is 100% stenosed. (Known to be occluded - on previous report noted as Mid LCx   Prox Cx to Mid Cx(-2nd Mrg) lesion is 95% stenosed just priot to stent.   Prox RCA-1 lesion is 90% stenosed just priot to long stented segment.   Prox RCA-2 Stent is 20% stenosed, followed by Mid RCA lesion is 70% stenosis   The left ventricular systolic function is normal.  The left ventricular ejection fraction is 50-55% by visual estimate.   LV end diastolic pressure is normal. SUMMARY Severe Three-Vessel CAD: Tandem 50 and 70% focal stenosis in the proximal LAD (RFR positive at 0.86) Mid LCx (2nd Mrg) 95% focal stenosis at the proximal edge of previously placed stent; this is just after the takeoff of the AV groove branch with known occlusion of LPL branch. Proximal RCA 90% stenosis just prior to stent with 70% ISR in the mid portion of the RCA stent Normal LVEDP, preserved LVEF RECOMMENDATIONS With three-vessel disease 2 of which involve in-stent restenosis/proximal and  stenosis as well as now RFR positive LAD disease, recommend CABG. Will admit to inpatient for stabilization and CVTS consultation. Restart IV heparin 8 hours after sheath removal Continue aggressive GM DT medical management for CAD/ACS Bryan Lemma, MD   Cardiac Studies   Cath: 04/02/21   Ost LAD lesion is 50% stenosed. followed by Prox LAD lesion is 70% stenosed. - RFR POSITIVE (0.86)   1st LPL lesion is 100% stenosed. (Known to be occluded - on previous report noted as Mid LCx   Prox Cx to Mid Cx(-2nd Mrg) lesion is 95% stenosed just priot to stent.   Prox RCA-1 lesion is 90% stenosed just priot to long stented segment.   Prox RCA-2 Stent is 20% stenosed, followed by Mid RCA lesion is 70% stenosis   The left ventricular systolic function is normal.  The left ventricular ejection fraction is 50-55% by visual estimate.   LV end diastolic pressure is normal.   SUMMARY Severe Three-Vessel CAD: Tandem 50 and 70% focal stenosis in the proximal LAD (RFR positive at 0.86) Mid LCx (2nd Mrg) 95% focal stenosis at the proximal edge of previously placed stent; this is just after the takeoff of the AV groove branch with known occlusion of LPL branch. Proximal RCA 90% stenosis just prior to stent with 70% ISR in the mid portion of the RCA stent Normal LVEDP, preserved LVEF     RECOMMENDATIONS With three-vessel disease 2 of which involve in-stent restenosis/proximal and stenosis as well as now RFR positive LAD disease, recommend CABG. Will admit to inpatient for stabilization and CVTS consultation. Restart IV heparin 8 hours after sheath removal Continue aggressive GM DT medical management for CAD/ACS   Bryan Lemma, MD  Diagnostic Dominance: Right    Patient Profile     55 y.o. male with a history of CAD s/p multiple overlapping stents of the RCA and stenting to OM1 with total occlusion of LCX on cardiac catheterization in 2019, paroxysmal SVT, hypertension, dyslipidemia, uncontrolled type 2  diabetes mellitus, morbid obesity, and remote smoking history (quit 20 years ago) who is transferred from [facility] for further management of chest pain with minimally elevated troponin.  Assessment & Plan    NSTEMI/Chest pain: presented from [facility] with chest pain and elevated troponin I. Underwent cardiac  cath noted above with multivessel CAD, along with ISR in the RCA. Recommendations for TCTS consult. No chest pain overnight.  -- on ASA, statin, BB, ARB -- IV heparin  HTN: Blood pressures are not well controlled. Says they are better controlled at home. Hospitals stress him out -- continue coreg 25mg  BID, norvasc 10mg  daily, irbesartan 300mg  daily, will increase hydralazine to 50mg  TID. May need to add spiro  HLD: Lipid panel at [facility]: Total Cholesterol 149, Triglycerides 92, HDL 41, LDL 89.6. -- on statin  DM: Hgb 6.4 (PTA meds- Pioglitazone-Metformin, Liraglutide) --  on SSI -- start Farxiga, would favor stopping Actos   pSVT: on BB  For questions or updates, please contact [facility] Please consult [email or web address] for contact info under        Signed, , NP  04/03/2021, 8:43 AM    I have personally seen and examined this patient. I agree with the assessment and plan as outlined above.  Pt transferred from [facility] with a NSTEMI. Cardiac cath with multi-vessel CAD. CT surgery has been consulted to discuss CABG. No chest pain today. Continue ASA, statin, beta blocker and ARB. Continue IV heparin.   08-30-2002 04/03/2021 10:03 AM

## 2021-04-03 NOTE — Progress Notes (Signed)
Pre-CABG testing has been completed. Preliminary results can be found in CV Proc through chart review.   04/03/21 11:39 AM Olen Cordial RVT

## 2021-04-03 NOTE — Progress Notes (Signed)
TR BAND REMOVAL  LOCATION:    right radial  DEFLATED PER PROTOCOL:    Yes.    TIME BAND OFF / DRESSING APPLIED:    2130  SITE UPON ARRIVAL:    Level 0  SITE AFTER BAND REMOVAL:    Level 1(SL.BRUISING)  CIRCULATION SENSATION AND MOVEMENT:    Within Normal Limits   Yes.    COMMENTS:  Pt.tolerated well

## 2021-04-03 NOTE — Progress Notes (Signed)
ANTICOAGULATION CONSULT NOTE - Follow Up Consult  Pharmacy Consult for heparin Indication:  CAD awaiting CABG consult  Labs: Recent Labs    04/02/21 1851 04/03/21 0618  HGB 16.0 15.0  HCT 46.0 44.5  PLT 235 196  HEPARINUNFRC  --  0.17*  CREATININE 1.13  --     Assessment: 55yo male subtherapeutic on heparin with initial dosing post-cath; no infusion issues or signs of bleeding per RN.  Goal of Therapy:  Heparin level 0.3-0.7 units/ml   Plan:  Will increase heparin infusion by 3 units/kgABW/hr to 1900 units/hr and check level in 6 hours.    Vernard Gambles, PharmD, BCPS  04/03/2021,7:00 AM

## 2021-04-03 NOTE — Plan of Care (Signed)
  Problem: Activity: Goal: Ability to return to baseline activity level will improve Outcome: Progressing   Problem: Cardiovascular: Goal: Vascular access site(s) Level 0-1 will be maintained Outcome: Progressing   

## 2021-04-04 ENCOUNTER — Inpatient Hospital Stay (HOSPITAL_COMMUNITY): Payer: 59

## 2021-04-04 DIAGNOSIS — I249 Acute ischemic heart disease, unspecified: Secondary | ICD-10-CM | POA: Diagnosis not present

## 2021-04-04 LAB — BLOOD GAS, ARTERIAL
Acid-base deficit: 2.6 mmol/L — ABNORMAL HIGH (ref 0.0–2.0)
Bicarbonate: 21.4 mmol/L (ref 20.0–28.0)
FIO2: 21
O2 Saturation: 96.7 %
Patient temperature: 36.5
pCO2 arterial: 33.7 mmHg (ref 32.0–48.0)
pH, Arterial: 7.415 (ref 7.350–7.450)
pO2, Arterial: 83.8 mmHg (ref 83.0–108.0)

## 2021-04-04 LAB — GLUCOSE, CAPILLARY
Glucose-Capillary: 96 mg/dL (ref 70–99)
Glucose-Capillary: 126 mg/dL — ABNORMAL HIGH (ref 70–99)
Glucose-Capillary: 109 mg/dL — ABNORMAL HIGH (ref 70–99)
Glucose-Capillary: 137 mg/dL — ABNORMAL HIGH (ref 70–99)

## 2021-04-04 LAB — SURGICAL PCR SCREEN
MRSA, PCR: NEGATIVE
Staphylococcus aureus: NEGATIVE

## 2021-04-04 LAB — CBC
HCT: 42.4 % (ref 39.0–52.0)
Hemoglobin: 14.3 g/dL (ref 13.0–17.0)
MCH: 30.9 pg (ref 26.0–34.0)
MCHC: 33.7 g/dL (ref 30.0–36.0)
MCV: 91.6 fL (ref 80.0–100.0)
Platelets: 194 10*3/uL (ref 150–400)
RBC: 4.63 MIL/uL (ref 4.22–5.81)
RDW: 14 % (ref 11.5–15.5)
WBC: 6.7 10*3/uL (ref 4.0–10.5)
nRBC: 0 % (ref 0.0–0.2)

## 2021-04-04 LAB — URINALYSIS, ROUTINE W REFLEX MICROSCOPIC
Bacteria, UA: NONE SEEN
Bilirubin Urine: NEGATIVE
Glucose, UA: >=500 mg/dL — AB
Hgb urine dipstick: NEGATIVE
Ketones, ur: 5 mg/dL — AB
Leukocytes,Ua: NEGATIVE
Nitrite: NEGATIVE
Protein, ur: NEGATIVE mg/dL
Specific Gravity, Urine: 1.031 — ABNORMAL HIGH (ref 1.005–1.030)
pH: 5 (ref 5.0–8.0)

## 2021-04-04 LAB — ABO/RH: ABO/RH(D): O POS

## 2021-04-04 LAB — PROTIME-INR
INR: 1 (ref 0.8–1.2)
Prothrombin Time: 13.4 seconds (ref 11.4–15.2)

## 2021-04-04 LAB — HEPARIN LEVEL (UNFRACTIONATED): Heparin Unfractionated: 0.76 IU/mL — ABNORMAL HIGH (ref 0.30–0.70)

## 2021-04-04 LAB — HEMOGLOBIN A1C
Hgb A1c MFr Bld: 6.5 % — ABNORMAL HIGH (ref 4.8–5.6)
Mean Plasma Glucose: 139.85 mg/dL

## 2021-04-04 LAB — APTT: aPTT: 107 seconds — ABNORMAL HIGH (ref 24–36)

## 2021-04-04 MED ORDER — MILRINONE LACTATE IN DEXTROSE 20-5 MG/100ML-% IV SOLN
0.3000 ug/kg/min | INTRAVENOUS | Status: DC
  Filled 2021-04-04: qty 100

## 2021-04-04 MED ORDER — CHLORHEXIDINE GLUCONATE CLOTH 2 % EX PADS
6.0000 | MEDICATED_PAD | Freq: Once | CUTANEOUS | Status: AC
  Administered 2021-04-05: 6 via TOPICAL

## 2021-04-04 MED ORDER — EPINEPHRINE HCL 5 MG/250ML IV SOLN IN NS
0.0000 ug/min | INTRAVENOUS | Status: DC
  Filled 2021-04-04: qty 250

## 2021-04-04 MED ORDER — DEXMEDETOMIDINE HCL IN NACL 400 MCG/100ML IV SOLN
0.1000 ug/kg/h | INTRAVENOUS | Status: AC
  Administered 2021-04-05: .5 ug/kg/h via INTRAVENOUS
  Administered 2021-04-05: .7 ug/kg/h via INTRAVENOUS
  Filled 2021-04-04: qty 100

## 2021-04-04 MED ORDER — CEFAZOLIN IN SODIUM CHLORIDE 3-0.9 GM/100ML-% IV SOLN
3.0000 g | INTRAVENOUS | Status: AC
  Administered 2021-04-05: 3 g via INTRAVENOUS
  Filled 2021-04-04 (×2): qty 100

## 2021-04-04 MED ORDER — SODIUM CHLORIDE 0.9 % IV SOLN
INTRAVENOUS | Status: DC
  Filled 2021-04-04: qty 30

## 2021-04-04 MED ORDER — NITROGLYCERIN IN D5W 200-5 MCG/ML-% IV SOLN
2.0000 ug/min | INTRAVENOUS | Status: AC
  Administered 2021-04-05: 5 ug/min via INTRAVENOUS
  Filled 2021-04-04: qty 250

## 2021-04-04 MED ORDER — TEMAZEPAM 15 MG PO CAPS
15.0000 mg | ORAL_CAPSULE | Freq: Once | ORAL | Status: DC | PRN
Start: 2021-04-04 — End: 2021-04-05

## 2021-04-04 MED ORDER — TRANEXAMIC ACID (OHS) BOLUS VIA INFUSION
15.0000 mg/kg | INTRAVENOUS | Status: AC
  Administered 2021-04-05: 2502 mg via INTRAVENOUS
  Filled 2021-04-04: qty 2502

## 2021-04-04 MED ORDER — MANNITOL 20 % IV SOLN
Freq: Once | INTRAVENOUS | Status: DC
  Filled 2021-04-04: qty 13

## 2021-04-04 MED ORDER — CEFAZOLIN SODIUM-DEXTROSE 2-4 GM/100ML-% IV SOLN
2.0000 g | INTRAVENOUS | Status: AC
  Administered 2021-04-05: 2 g via INTRAVENOUS
  Filled 2021-04-04: qty 100

## 2021-04-04 MED ORDER — PHENYLEPHRINE HCL-NACL 20-0.9 MG/250ML-% IV SOLN
30.0000 ug/min | INTRAVENOUS | Status: AC
  Administered 2021-04-05: 30 ug/min via INTRAVENOUS
  Filled 2021-04-04: qty 250

## 2021-04-04 MED ORDER — METOPROLOL TARTRATE 12.5 MG HALF TABLET
12.5000 mg | ORAL_TABLET | Freq: Once | ORAL | Status: AC
  Administered 2021-04-05: 12.5 mg via ORAL
  Filled 2021-04-04: qty 1

## 2021-04-04 MED ORDER — TRANEXAMIC ACID 1000 MG/10ML IV SOLN
1.5000 mg/kg/h | INTRAVENOUS | Status: AC
  Administered 2021-04-05: 1.5 mg/kg/h via INTRAVENOUS
  Filled 2021-04-04 (×2): qty 25

## 2021-04-04 MED ORDER — NOREPINEPHRINE 4 MG/250ML-% IV SOLN
0.0000 ug/min | INTRAVENOUS | Status: DC
Start: 2021-04-05 — End: 2021-04-05
  Filled 2021-04-04: qty 250

## 2021-04-04 MED ORDER — VANCOMYCIN HCL 1500 MG/300ML IV SOLN
1500.0000 mg | INTRAVENOUS | Status: AC
  Administered 2021-04-05: 1500 mg via INTRAVENOUS
  Filled 2021-04-04: qty 300

## 2021-04-04 MED ORDER — BISACODYL 5 MG PO TBEC
5.0000 mg | DELAYED_RELEASE_TABLET | Freq: Once | ORAL | Status: AC
  Administered 2021-04-04: 5 mg via ORAL
  Filled 2021-04-04: qty 1

## 2021-04-04 MED ORDER — PLASMA-LYTE A IV SOLN
INTRAVENOUS | Status: DC
  Filled 2021-04-04: qty 5

## 2021-04-04 MED ORDER — TRANEXAMIC ACID (OHS) PUMP PRIME SOLUTION
2.0000 mg/kg | INTRAVENOUS | Status: DC
  Filled 2021-04-04: qty 3.34

## 2021-04-04 MED ORDER — POTASSIUM CHLORIDE 2 MEQ/ML IV SOLN
80.0000 meq | INTRAVENOUS | Status: DC
  Filled 2021-04-04: qty 40

## 2021-04-04 MED ORDER — CHLORHEXIDINE GLUCONATE 0.12 % MT SOLN
15.0000 mL | Freq: Once | OROMUCOSAL | Status: AC
  Administered 2021-04-05: 15 mL via OROMUCOSAL
  Filled 2021-04-04: qty 15

## 2021-04-04 MED ORDER — INSULIN REGULAR(HUMAN) IN NACL 100-0.9 UT/100ML-% IV SOLN
INTRAVENOUS | Status: AC
  Administered 2021-04-05: 2.4 [IU]/h via INTRAVENOUS
  Filled 2021-04-04: qty 100

## 2021-04-04 NOTE — Progress Notes (Addendum)
Progress Note  Patient Name: Kevin Lawrence Date of Encounter: 04/04/2021  [facility] Cardiologist: Garwin Brothers, MD   Subjective   No issues overnight. Looks like surgery is planned for tomorrow.  Inpatient Medications    Scheduled Meds:  amLODipine  10 mg Oral Daily   aspirin EC  81 mg Oral Daily   carvedilol  25 mg Oral BID   colesevelam  1,875 mg Oral BID WC   dapagliflozin propanediol  10 mg Oral Daily   famotidine  20 mg Oral Daily   hydrALAZINE  50 mg Oral TID   insulin aspart  0-15 Units Subcutaneous TID WC   insulin glargine-yfgn  40 Units Subcutaneous Daily   irbesartan  300 mg Oral Daily   mometasone-formoterol  2 puff Inhalation BID   rosuvastatin  40 mg Oral Daily   sodium chloride flush  3 mL Intravenous Q12H   Continuous Infusions:  sodium chloride     heparin 1,950 Units/hr (04/04/21 0446)   PRN Meds: sodium chloride, acetaminophen, labetalol, nitroGLYCERIN, ondansetron (ZOFRAN) IV, sodium chloride flush   Vital Signs    Vitals:   04/03/21 2018 04/04/21 0220 04/04/21 0237 04/04/21 0837  BP: 119/65  (!) 154/88   Pulse: 72  76   Resp: 16  15   Temp: (!) 97.2 F (36.2 C)  (!) 97.2 F (36.2 C)   TempSrc: Axillary  Oral   SpO2: 95%  98% 96%  Weight:  (!) 166.8 kg    Height:        Intake/Output Summary (Last 24 hours) at 04/04/2021 0945 Last data filed at 04/04/2021 0900 Gross per 24 hour  Intake 541.5 ml  Output 1975 ml  Net -1433.5 ml   Last 3 Weights 04/04/2021 04/02/2021 02/15/2021  Weight (lbs) 367 lb 11.2 oz 380 lb 373 lb 12.8 oz  Weight (kg) 166.788 kg 172.367 kg 169.555 kg      Telemetry    SR with 1st degree AVB - Personally Reviewed  ECG    SR with 2nd degree AVB - Personally Reviewed  Physical Exam   GEN: No acute distress.   Neck: No JVD Cardiac: RRR, no murmurs, rubs, or gallops.  Respiratory: Clear to auscultation bilaterally. GI: Soft, nontender, non-distended  MS: No edema; No deformity. Neuro:  Nonfocal   Psych: Normal affect   Labs    High Sensitivity Troponin:  No results for input(s): TROPONINIHS in the last 720 hours.    Chemistry Recent Labs  Lab 04/02/21 1851 04/03/21 0618  NA  --  136  K  --  4.0  CL  --  104  CO2  --  26  GLUCOSE  --  131*  BUN  --  14  CREATININE 1.13 1.18  CALCIUM  --  8.9  PROT  --  6.3*  ALBUMIN  --  3.5  AST  --  28  ALT  --  27  ALKPHOS  --  49  BILITOT  --  1.4*  GFRNONAA >60 >60  ANIONGAP  --  6     Hematology Recent Labs  Lab 04/02/21 1851 04/03/21 0618 04/04/21 0236  WBC 9.9 7.3 6.7  RBC 5.03 4.80 4.63  HGB 16.0 15.0 14.3  HCT 46.0 44.5 42.4  MCV 91.5 92.7 91.6  MCH 31.8 31.3 30.9  MCHC 34.8 33.7 33.7  RDW 13.9 13.7 14.0  PLT 235 196 194    BNPNo results for input(s): BNP, PROBNP in the last 168 hours.  DDimer No results for input(s): DDIMER in the last 168 hours.   Radiology    CARDIAC CATHETERIZATION  Result Date: 04/02/2021 Formatting of this result is different from the original.   Ost LAD lesion is 50% stenosed. followed by Prox LAD lesion is 70% stenosed. - RFR POSITIVE (0.86)   1st LPL lesion is 100% stenosed. (Known to be occluded - on previous report noted as Mid LCx   Prox Cx to Mid Cx(-2nd Mrg) lesion is 95% stenosed just priot to stent.   Prox RCA-1 lesion is 90% stenosed just priot to long stented segment.   Prox RCA-2 Stent is 20% stenosed, followed by Mid RCA lesion is 70% stenosis   The left ventricular systolic function is normal.  The left ventricular ejection fraction is 50-55% by visual estimate.   LV end diastolic pressure is normal. SUMMARY Severe Three-Vessel CAD: Tandem 50 and 70% focal stenosis in the proximal LAD (RFR positive at 0.86) Mid LCx (2nd Mrg) 95% focal stenosis at the proximal edge of previously placed stent; this is just after the takeoff of the AV groove branch with known occlusion of LPL branch. Proximal RCA 90% stenosis just prior to stent with 70% ISR in the mid portion of the RCA stent  Normal LVEDP, preserved LVEF RECOMMENDATIONS With three-vessel disease 2 of which involve in-stent restenosis/proximal and stenosis as well as now RFR positive LAD disease, recommend CABG. Will admit to inpatient for stabilization and CVTS consultation. Restart IV heparin 8 hours after sheath removal Continue aggressive GM DT medical management for CAD/ACS Bryan Lemmaavid Harding, MD  VAS US DOPPLER PRE CABG  Result Date: 04/03/2021 PREOPERATIVE VASCULAR EVALUATION Patient Name:  Kevin Lawrence  Date of Exam:   04/03/2021 Medical Rec #: [id number]       Accession #:    [id number] Date of Birth: 11-29-1965       Patient Gender: M Patient Age:   55 years [facility] Procedure:      VAS US DOPPLER PRE CABG Referring Phys: HARRELL LIGHTFOOT --------------------------------------------------------------------------------  Indications:      Pre-CABG. Risk Factors:     Hyperlipidemia, Diabetes, coronary artery disease. Limitations:      Patient body habitus, left upper arm blood glucose monitor Comparison Study: No prior studies. Performing Technologist: Olen Cordialollins, Greg RVT  Examination Guidelines: A complete evaluation includes B-mode imaging, spectral Doppler, color Doppler, and power Doppler as needed of all accessible portions of each vessel. Bilateral testing is considered an integral part of a complete examination. Limited examinations for reoccurring indications may be performed as noted.  Right Carotid Findings: +----------+--------+--------+--------+--------+--------+           PSV cm/sEDV cm/sStenosisDescribeComments +----------+--------+--------+--------+--------+--------+ CCA Prox  173     23                      tortuous +----------+--------+--------+--------+--------+--------+ CCA Distal69      18              calcific         +----------+--------+--------+--------+--------+--------+ ICA Prox  80      26              calcific          +----------+--------+--------+--------+--------+--------+ ICA Distal84      31                      tortuous +----------+--------+--------+--------+--------+--------+ ECA       77  11                               +----------+--------+--------+--------+--------+--------+ +----------+--------+-------+--------+------------+           PSV cm/sEDV cmsDescribeArm Pressure +----------+--------+-------+--------+------------+ Subclavian34                                  +----------+--------+-------+--------+------------+ +---------+--------+--+--------+--+---------+ VertebralPSV cm/s31EDV cm/s11Antegrade +---------+--------+--+--------+--+---------+ Left Carotid Findings: +----------+--------+--------+--------+--------------------------+--------+           PSV cm/sEDV cm/sStenosisDescribe                  Comments +----------+--------+--------+--------+--------------------------+--------+ CCA Prox  112     24              smooth and heterogenous            +----------+--------+--------+--------+--------------------------+--------+ CCA Distal56      14              irregular and heterogenous         +----------+--------+--------+--------+--------------------------+--------+ ICA Prox  43      11              calcific                           +----------+--------+--------+--------+--------------------------+--------+ ICA Distal70      29                                        tortuous +----------+--------+--------+--------+--------------------------+--------+ ECA       64      8                                                  +----------+--------+--------+--------+--------------------------+--------+  +----------+--------+--------+--------+------------+ SubclavianPSV cm/sEDV cm/sDescribeArm Pressure +----------+--------+--------+--------+------------+           102                                   +----------+--------+--------+--------+------------+ +---------+--------+--+--------+--+---------+ VertebralPSV cm/s56EDV cm/s16Antegrade +---------+--------+--+--------+--+---------+  ABI Findings: +--------+------------------+-----+---------+--------+ Right   Rt Pressure (mmHg)IndexWaveform Comment  +--------+------------------+-----+---------+--------+ [id number]                    triphasic         +--------+------------------+-----+---------+--------+ PTA     202               1.20 triphasic         +--------+------------------+-----+---------+--------+ DP      193               1.15 triphasic         +--------+------------------+-----+---------+--------+ +--------+------------------+-----+---------+---------------------+ Left    Lt Pressure (mmHg)IndexWaveform Comment               +--------+------------------+-----+---------+---------------------+ Brachial                       triphasicBlood glucose monitor +--------+------------------+-----+---------+---------------------+ PTA     193               1.15 triphasic                      +--------+------------------+-----+---------+---------------------+  DP      172               1.02 triphasic                      +--------+------------------+-----+---------+---------------------+ +-------+---------------+----------------+ ABI/TBIToday's ABI/TBIPrevious ABI/TBI +-------+---------------+----------------+ Right  1.2                             +-------+---------------+----------------+ Left   1.15                            +-------+---------------+----------------+  Right Doppler Findings: +--------+--------+-----+---------+--------+ Site    PressureIndexDoppler  Comments +--------+--------+-----+---------+--------+ [id number]          triphasic         +--------+--------+-----+---------+--------+ Radial               triphasic          +--------+--------+-----+---------+--------+ Ulnar                triphasic         +--------+--------+-----+---------+--------+  Left Doppler Findings: +--------+--------+-----+---------+---------------------+ Site    PressureIndexDoppler  Comments              +--------+--------+-----+---------+---------------------+ Brachial             triphasicBlood glucose monitor +--------+--------+-----+---------+---------------------+ Radial               triphasic                      +--------+--------+-----+---------+---------------------+ Ulnar                triphasic                      +--------+--------+-----+---------+---------------------+  Summary: Right Carotid: Velocities in the right ICA are consistent with a 1-39% stenosis. Left Carotid: Velocities in the left ICA are consistent with a 1-39% stenosis. Vertebrals: Bilateral vertebral arteries demonstrate antegrade flow. Right ABI: Resting right ankle-brachial index is within normal range. No evidence of significant right lower extremity arterial disease. Left ABI: Resting left ankle-brachial index is within normal range. No evidence of significant left lower extremity arterial disease. Right Upper Extremity: Doppler waveforms remain within normal limits with right radial compression. Doppler waveform obliterate with right ulnar compression. Left Upper Extremity: Doppler waveform obliterate with left radial compression. Doppler waveforms remain within normal limits with left ulnar compression.  Electronically signed by Fabienne Bruns MD on 04/03/2021 at 5:31:54 PM.    Final     Cardiac Studies   Cath: 04/02/21    Ost LAD lesion is 50% stenosed. followed by Prox LAD lesion is 70% stenosed. - RFR POSITIVE (0.86)   1st LPL lesion is 100% stenosed. (Known to be occluded - on previous report noted as Mid LCx   Prox Cx to Mid Cx(-2nd Mrg) lesion is 95% stenosed just priot to stent.   Prox RCA-1 lesion is 90% stenosed just priot to  long stented segment.   Prox RCA-2 Stent is 20% stenosed, followed by Mid RCA lesion is 70% stenosis   The left ventricular systolic function is normal.  The left ventricular ejection fraction is 50-55% by visual estimate.   LV end diastolic pressure is normal.   SUMMARY Severe Three-Vessel CAD: Tandem 50 and 70% focal stenosis in the proximal LAD (RFR positive at 0.86) Mid LCx (2nd Mrg) 95% focal  stenosis at the proximal edge of previously placed stent; this is just after the takeoff of the AV groove branch with known occlusion of LPL branch. Proximal RCA 90% stenosis just prior to stent with 70% ISR in the mid portion of the RCA stent Normal LVEDP, preserved LVEF     RECOMMENDATIONS With three-vessel disease 2 of which involve in-stent restenosis/proximal and stenosis as well as now RFR positive LAD disease, recommend CABG. Will admit to inpatient for stabilization and CVTS consultation. Restart IV heparin 8 hours after sheath removal Continue aggressive GM DT medical management for CAD/ACS   Bryan Lemma, MD   Diagnostic Dominance: Right     Patient Profile     55 y.o. male with a history of CAD s/p multiple overlapping stents of the RCA and stenting to OM1 with total occlusion of LCX on cardiac catheterization in 2019, paroxysmal SVT, hypertension, dyslipidemia, uncontrolled type 2 diabetes mellitus, morbid obesity, and remote smoking history (quit 20 years ago) who is transferred from [facility] for further management of chest pain with minimally elevated troponin.  Assessment & Plan    NSTEMI/Chest pain: presented from [facility] with chest pain and elevated troponin I. Underwent cardiac cath noted above with multivessel CAD, along with ISR in the RCA. Recommendations for TCTS consult, seen by Dr. Cliffton Asters. On OR schedule for tomorrow. No chest pain overnight. -- on ASA, statin, BB, ARB -- IV heparin   HTN: blood pressures are improved -- continue coreg 25mg   BID, norvasc 10mg  daily, irbesartan 300mg  daily, increased hydralazine to 50mg  TID yesterday.    HLD: Lipid panel at [facility]: Total Cholesterol 149, Triglycerides 92, HDL 41, LDL 89.6. -- on statin   DM: Hgb 6.4 (PTA meds- Pioglitazone-Metformin, Liraglutide) --  on SSI -- started Farxiga, would favor stopping Actos   pSVT: on BB   For questions or updates, please contact [facility] Please consult [email or web address] for contact info under        Signed, , NP  04/04/2021, 9:45 AM    I have personally seen and examined this patient. I agree with the assessment and plan as outlined above.  Cardiac cath with severe multi-vessel CAD. Plans for CABG tomorrow. No chest pain. Continue ASA, statin, beta blocker and ARB. Will continue IV heparin while awaiting CABG.   [facility], MD, [facility] 04/04/2021 10:17 AM

## 2021-04-04 NOTE — Progress Notes (Signed)
CARDIAC REHAB PHASE I   PRE:  Rate/Rhythm: 72 SR    BP: sitting 134/84    SaO2: 96 RA  MODE:  Ambulation: 940 ft   POST:  Rate/Rhythm: 95 SR    BP: standing 155/113, 5 min later sitting 157/102    SaO2: 96 RA  No c/o walking however BP quite elevated after walk. Discussed IS (2500+ml), sternal precautions, mobility post op and d/c planning. Pt sts he has friends/family that can call/text and also go to store for him however no one to actually stay with him. He is reading Insurance underwriter. [phone]  [name] CES, ACSM 04/04/2021 11:04 AM

## 2021-04-04 NOTE — Progress Notes (Signed)
ANTICOAGULATION CONSULT NOTE - Follow Up Consult  Pharmacy Consult for heparin Indication:  CAD awaiting CABG consult  Labs: Recent Labs    04/02/21 1851 04/02/21 1851 04/03/21 0618 04/03/21 1359 04/03/21 2154 04/04/21 0236  HGB 16.0  --  15.0  --   --  14.3  HCT 46.0  --  44.5  --   --  42.4  PLT 235  --  196  --   --  194  HEPARINUNFRC  --    < > 0.17* 0.21* 0.53 0.76*  CREATININE 1.13  --  1.18  --   --   --    < > = values in this interval not displayed.     Assessment/Plan:  55yo male with slightly supratherapeutic level on heparin drip. Will decrease infusion to 1950 units/hr.  Thanks for allowing pharmacy to be a part of this patient's care.  Talbert Cage, PharmD Clinical Pharmacist  04/04/2021,4:40 AM

## 2021-04-04 NOTE — Anesthesia Preprocedure Evaluation (Addendum)
Anesthesia Evaluation  Patient identified by MRN, date of birth, ID band Patient awake    Reviewed: Allergy & Precautions, NPO status , Patient's Chart, lab work & pertinent test results, reviewed documented beta blocker date and time   Airway Mallampati: III  TM Distance: >3 FB Neck ROM: Full    Dental  (+) Teeth Intact, Dental Advisory Given, Chipped   Pulmonary sleep apnea and Continuous Positive Airway Pressure Ventilation , former smoker,    Pulmonary exam normal breath sounds clear to auscultation       Cardiovascular hypertension, Pt. on medications and Pt. on home beta blockers + angina + CAD and + Cardiac Stents  Normal cardiovascular exam+ dysrhythmias Supra Ventricular Tachycardia  Rhythm:Regular Rate:Normal  Echo 03/07/21: 1. Left ventricular ejection fraction, by estimation, is 50 to 55%. The  left ventricle has low normal function. The left ventricle has no regional  wall motion abnormalities. There is mild concentric left ventricular  hypertrophy. Left ventricular  diastolic parameters were normal.  2. Right ventricular systolic function is normal. The right ventricular  size is normal. There is normal pulmonary artery systolic pressure.  3. The mitral valve is normal in structure. No evidence of mitral valve  regurgitation. No evidence of mitral stenosis.  4. The aortic valve is tricuspid. Aortic valve regurgitation is not  visualized. No aortic stenosis is present.    Neuro/Psych negative neurological ROS     GI/Hepatic Neg liver ROS, GERD  Medicated,  Endo/Other  diabetes, Type 2, Oral Hypoglycemic AgentsMorbid obesity  Renal/GU negative Renal ROS     Musculoskeletal  (+) Arthritis ,   Abdominal   Peds  Hematology negative hematology ROS (+)   Anesthesia Other Findings   Reproductive/Obstetrics                           Anesthesia Physical Anesthesia Plan  ASA:  4  Anesthesia Plan: General   Post-op Pain Management:    Induction: Intravenous  PONV Risk Score and Plan: 2 and Treatment may vary due to age or medical condition and Midazolam  Airway Management Planned: Oral ETT  Additional Equipment: Arterial line, CVP, TEE and Ultrasound Guidance Line Placement  Intra-op Plan:   Post-operative Plan: Post-operative intubation/ventilation  Informed Consent: I have reviewed the patients History and Physical, chart, labs and discussed the procedure including the risks, benefits and alternatives for the proposed anesthesia with the patient or authorized representative who has indicated his/her understanding and acceptance.     Dental advisory given  Plan Discussed with: CRNA  Anesthesia Plan Comments: (FloTrac)       Anesthesia Quick Evaluation

## 2021-04-05 ENCOUNTER — Inpatient Hospital Stay (HOSPITAL_COMMUNITY): Payer: 59 | Admitting: Anesthesiology

## 2021-04-05 ENCOUNTER — Inpatient Hospital Stay (HOSPITAL_COMMUNITY)
Admission: EM | Disposition: A | Discharge status: Home Health | Payer: Self-pay | Source: Other Acute Inpatient Hospital | Attending: Thoracic Surgery (Cardiothoracic Vascular Surgery)

## 2021-04-05 ENCOUNTER — Inpatient Hospital Stay (HOSPITAL_COMMUNITY): Payer: 59

## 2021-04-05 DIAGNOSIS — Z951 Presence of aortocoronary bypass graft: Secondary | ICD-10-CM

## 2021-04-05 DIAGNOSIS — I2510 Atherosclerotic heart disease of native coronary artery without angina pectoris: Secondary | ICD-10-CM

## 2021-04-05 HISTORY — PX: CORONARY ARTERY BYPASS GRAFT: SHX141

## 2021-04-05 HISTORY — DX: Presence of aortocoronary bypass graft: Z95.1

## 2021-04-05 HISTORY — PX: TEE WITHOUT CARDIOVERSION: SHX5443

## 2021-04-05 HISTORY — PX: RADIAL ARTERY HARVEST: SHX5067

## 2021-04-05 LAB — POCT I-STAT 7, (LYTES, BLD GAS, ICA,H+H)
Acid-base deficit: 3 mmol/L — ABNORMAL HIGH (ref 0.0–2.0)
Acid-base deficit: 1 mmol/L (ref 0.0–2.0)
Acid-base deficit: 3 mmol/L — ABNORMAL HIGH (ref 0.0–2.0)
Acid-base deficit: 1 mmol/L (ref 0.0–2.0)
Acid-base deficit: 3 mmol/L — ABNORMAL HIGH (ref 0.0–2.0)
Acid-base deficit: 3 mmol/L — ABNORMAL HIGH (ref 0.0–2.0)
Acid-base deficit: 6 mmol/L — ABNORMAL HIGH (ref 0.0–2.0)
Acid-base deficit: 5 mmol/L — ABNORMAL HIGH (ref 0.0–2.0)
Bicarbonate: 23 mmol/L (ref 20.0–28.0)
Bicarbonate: 24 mmol/L (ref 20.0–28.0)
Bicarbonate: 22.3 mmol/L (ref 20.0–28.0)
Bicarbonate: 22.4 mmol/L (ref 20.0–28.0)
Bicarbonate: 24.7 mmol/L (ref 20.0–28.0)
Bicarbonate: 21.9 mmol/L (ref 20.0–28.0)
Bicarbonate: 18.7 mmol/L — ABNORMAL LOW (ref 20.0–28.0)
Bicarbonate: 19.7 mmol/L — ABNORMAL LOW (ref 20.0–28.0)
Calcium, Ion: 1.23 mmol/L (ref 1.15–1.40)
Calcium, Ion: 1.05 mmol/L — ABNORMAL LOW (ref 1.15–1.40)
Calcium, Ion: 1.1 mmol/L — ABNORMAL LOW (ref 1.15–1.40)
Calcium, Ion: 1.1 mmol/L — ABNORMAL LOW (ref 1.15–1.40)
Calcium, Ion: 1.27 mmol/L (ref 1.15–1.40)
Calcium, Ion: 1.2 mmol/L (ref 1.15–1.40)
Calcium, Ion: 1.14 mmol/L — ABNORMAL LOW (ref 1.15–1.40)
Calcium, Ion: 1.18 mmol/L (ref 1.15–1.40)
HCT: 43 % (ref 39.0–52.0)
HCT: 32 % — ABNORMAL LOW (ref 39.0–52.0)
HCT: 32 % — ABNORMAL LOW (ref 39.0–52.0)
HCT: 33 % — ABNORMAL LOW (ref 39.0–52.0)
HCT: 33 % — ABNORMAL LOW (ref 39.0–52.0)
HCT: 34 % — ABNORMAL LOW (ref 39.0–52.0)
HCT: 32 % — ABNORMAL LOW (ref 39.0–52.0)
HCT: 35 % — ABNORMAL LOW (ref 39.0–52.0)
Hemoglobin: 14.6 g/dL (ref 13.0–17.0)
Hemoglobin: 10.9 g/dL — ABNORMAL LOW (ref 13.0–17.0)
Hemoglobin: 10.9 g/dL — ABNORMAL LOW (ref 13.0–17.0)
Hemoglobin: 11.2 g/dL — ABNORMAL LOW (ref 13.0–17.0)
Hemoglobin: 11.2 g/dL — ABNORMAL LOW (ref 13.0–17.0)
Hemoglobin: 11.6 g/dL — ABNORMAL LOW (ref 13.0–17.0)
Hemoglobin: 10.9 g/dL — ABNORMAL LOW (ref 13.0–17.0)
Hemoglobin: 11.9 g/dL — ABNORMAL LOW (ref 13.0–17.0)
O2 Saturation: 99 %
O2 Saturation: 100 %
O2 Saturation: 100 %
O2 Saturation: 100 %
O2 Saturation: 93 %
O2 Saturation: 95 %
O2 Saturation: 90 %
O2 Saturation: 96 %
Patient temperature: 36.6
Patient temperature: 36.4
Patient temperature: 97.5
Potassium: 3.6 mmol/L (ref 3.5–5.1)
Potassium: 4.1 mmol/L (ref 3.5–5.1)
Potassium: 4.5 mmol/L (ref 3.5–5.1)
Potassium: 4.2 mmol/L (ref 3.5–5.1)
Potassium: 4.5 mmol/L (ref 3.5–5.1)
Potassium: 4.1 mmol/L (ref 3.5–5.1)
Potassium: 3.8 mmol/L (ref 3.5–5.1)
Potassium: 4 mmol/L (ref 3.5–5.1)
Sodium: 141 mmol/L (ref 135–145)
Sodium: 140 mmol/L (ref 135–145)
Sodium: 138 mmol/L (ref 135–145)
Sodium: 140 mmol/L (ref 135–145)
Sodium: 140 mmol/L (ref 135–145)
Sodium: 141 mmol/L (ref 135–145)
Sodium: 142 mmol/L (ref 135–145)
Sodium: 140 mmol/L (ref 135–145)
TCO2: 24 mmol/L (ref 22–32)
TCO2: 25 mmol/L (ref 22–32)
TCO2: 23 mmol/L (ref 22–32)
TCO2: 24 mmol/L (ref 22–32)
TCO2: 26 mmol/L (ref 22–32)
TCO2: 23 mmol/L (ref 22–32)
TCO2: 20 mmol/L — ABNORMAL LOW (ref 22–32)
TCO2: 21 mmol/L — ABNORMAL LOW (ref 22–32)
pCO2 arterial: 45.6 mmHg (ref 32.0–48.0)
pCO2 arterial: 42.7 mmHg (ref 32.0–48.0)
pCO2 arterial: 38.9 mmHg (ref 32.0–48.0)
pCO2 arterial: 41.6 mmHg (ref 32.0–48.0)
pCO2 arterial: 43.5 mmHg (ref 32.0–48.0)
pCO2 arterial: 38.9 mmHg (ref 32.0–48.0)
pCO2 arterial: 33 mmHg (ref 32.0–48.0)
pCO2 arterial: 35.1 mmHg (ref 32.0–48.0)
pH, Arterial: 7.31 — ABNORMAL LOW (ref 7.350–7.450)
pH, Arterial: 7.358 (ref 7.350–7.450)
pH, Arterial: 7.367 (ref 7.350–7.450)
pH, Arterial: 7.34 — ABNORMAL LOW (ref 7.350–7.450)
pH, Arterial: 7.362 (ref 7.350–7.450)
pH, Arterial: 7.356 (ref 7.350–7.450)
pH, Arterial: 7.359 (ref 7.350–7.450)
pH, Arterial: 7.354 (ref 7.350–7.450)
pO2, Arterial: 176 mmHg — ABNORMAL HIGH (ref 83.0–108.0)
pO2, Arterial: 273 mmHg — ABNORMAL HIGH (ref 83.0–108.0)
pO2, Arterial: 228 mmHg — ABNORMAL HIGH (ref 83.0–108.0)
pO2, Arterial: 252 mmHg — ABNORMAL HIGH (ref 83.0–108.0)
pO2, Arterial: 73 mmHg — ABNORMAL LOW (ref 83.0–108.0)
pO2, Arterial: 79 mmHg — ABNORMAL LOW (ref 83.0–108.0)
pO2, Arterial: 58 mmHg — ABNORMAL LOW (ref 83.0–108.0)
pO2, Arterial: 83 mmHg (ref 83.0–108.0)

## 2021-04-05 LAB — POCT I-STAT, CHEM 8
BUN: 17 mg/dL (ref 6–20)
BUN: 18 mg/dL (ref 6–20)
BUN: 19 mg/dL (ref 6–20)
BUN: 19 mg/dL (ref 6–20)
BUN: 20 mg/dL (ref 6–20)
Calcium, Ion: 1.23 mmol/L (ref 1.15–1.40)
Calcium, Ion: 1.2 mmol/L (ref 1.15–1.40)
Calcium, Ion: 1.1 mmol/L — ABNORMAL LOW (ref 1.15–1.40)
Calcium, Ion: 1.11 mmol/L — ABNORMAL LOW (ref 1.15–1.40)
Calcium, Ion: 1.28 mmol/L (ref 1.15–1.40)
Chloride: 105 mmol/L (ref 98–111)
Chloride: 106 mmol/L (ref 98–111)
Chloride: 105 mmol/L (ref 98–111)
Chloride: 106 mmol/L (ref 98–111)
Chloride: 106 mmol/L (ref 98–111)
Creatinine, Ser: 1.2 mg/dL (ref 0.61–1.24)
Creatinine, Ser: 1.2 mg/dL (ref 0.61–1.24)
Creatinine, Ser: 1.2 mg/dL (ref 0.61–1.24)
Creatinine, Ser: 1.2 mg/dL (ref 0.61–1.24)
Creatinine, Ser: 1.2 mg/dL (ref 0.61–1.24)
Glucose, Bld: 102 mg/dL — ABNORMAL HIGH (ref 70–99)
Glucose, Bld: 127 mg/dL — ABNORMAL HIGH (ref 70–99)
Glucose, Bld: 109 mg/dL — ABNORMAL HIGH (ref 70–99)
Glucose, Bld: 108 mg/dL — ABNORMAL HIGH (ref 70–99)
Glucose, Bld: 118 mg/dL — ABNORMAL HIGH (ref 70–99)
HCT: 41 % (ref 39.0–52.0)
HCT: 38 % — ABNORMAL LOW (ref 39.0–52.0)
HCT: 32 % — ABNORMAL LOW (ref 39.0–52.0)
HCT: 31 % — ABNORMAL LOW (ref 39.0–52.0)
HCT: 33 % — ABNORMAL LOW (ref 39.0–52.0)
Hemoglobin: 13.9 g/dL (ref 13.0–17.0)
Hemoglobin: 12.9 g/dL — ABNORMAL LOW (ref 13.0–17.0)
Hemoglobin: 10.9 g/dL — ABNORMAL LOW (ref 13.0–17.0)
Hemoglobin: 10.5 g/dL — ABNORMAL LOW (ref 13.0–17.0)
Hemoglobin: 11.2 g/dL — ABNORMAL LOW (ref 13.0–17.0)
Potassium: 3.6 mmol/L (ref 3.5–5.1)
Potassium: 4 mmol/L (ref 3.5–5.1)
Potassium: 4.5 mmol/L (ref 3.5–5.1)
Potassium: 4.6 mmol/L (ref 3.5–5.1)
Potassium: 4.2 mmol/L (ref 3.5–5.1)
Sodium: 141 mmol/L (ref 135–145)
Sodium: 140 mmol/L (ref 135–145)
Sodium: 139 mmol/L (ref 135–145)
Sodium: 139 mmol/L (ref 135–145)
Sodium: 140 mmol/L (ref 135–145)
TCO2: 23 mmol/L (ref 22–32)
TCO2: 22 mmol/L (ref 22–32)
TCO2: 23 mmol/L (ref 22–32)
TCO2: 24 mmol/L (ref 22–32)
TCO2: 24 mmol/L (ref 22–32)

## 2021-04-05 LAB — CBC
HCT: 43.1 % (ref 39.0–52.0)
HCT: 35.3 % — ABNORMAL LOW (ref 39.0–52.0)
HCT: 36.8 % — ABNORMAL LOW (ref 39.0–52.0)
Hemoglobin: 14.7 g/dL (ref 13.0–17.0)
Hemoglobin: 12.2 g/dL — ABNORMAL LOW (ref 13.0–17.0)
Hemoglobin: 12.3 g/dL — ABNORMAL LOW (ref 13.0–17.0)
MCH: 30.9 pg (ref 26.0–34.0)
MCH: 32 pg (ref 26.0–34.0)
MCH: 31.3 pg (ref 26.0–34.0)
MCHC: 34.1 g/dL (ref 30.0–36.0)
MCHC: 34.6 g/dL (ref 30.0–36.0)
MCHC: 33.4 g/dL (ref 30.0–36.0)
MCV: 90.7 fL (ref 80.0–100.0)
MCV: 92.7 fL (ref 80.0–100.0)
MCV: 93.6 fL (ref 80.0–100.0)
Platelets: 208 10*3/uL (ref 150–400)
Platelets: 130 10*3/uL — ABNORMAL LOW (ref 150–400)
Platelets: 153 K/uL (ref 150–400)
RBC: 4.75 MIL/uL (ref 4.22–5.81)
RBC: 3.81 MIL/uL — ABNORMAL LOW (ref 4.22–5.81)
RBC: 3.93 MIL/uL — ABNORMAL LOW (ref 4.22–5.81)
RDW: 13.8 % (ref 11.5–15.5)
RDW: 13.8 % (ref 11.5–15.5)
RDW: 13.8 % (ref 11.5–15.5)
WBC: 7.8 10*3/uL (ref 4.0–10.5)
WBC: 11.5 10*3/uL — ABNORMAL HIGH (ref 4.0–10.5)
WBC: 17.6 K/uL — ABNORMAL HIGH (ref 4.0–10.5)
nRBC: 0 % (ref 0.0–0.2)
nRBC: 0 % (ref 0.0–0.2)
nRBC: 0 % (ref 0.0–0.2)

## 2021-04-05 LAB — BASIC METABOLIC PANEL WITH GFR
Anion gap: 8 (ref 5–15)
BUN: 19 mg/dL (ref 6–20)
CO2: 20 mmol/L — ABNORMAL LOW (ref 22–32)
Calcium: 8.1 mg/dL — ABNORMAL LOW (ref 8.9–10.3)
Chloride: 110 mmol/L (ref 98–111)
Creatinine, Ser: 1.35 mg/dL — ABNORMAL HIGH (ref 0.61–1.24)
GFR, Estimated: >60 mL/min (ref >60)
Glucose, Bld: 130 mg/dL — ABNORMAL HIGH (ref 70–99)
Potassium: 4 mmol/L (ref 3.5–5.1)
Sodium: 138 mmol/L (ref 135–145)

## 2021-04-05 LAB — BASIC METABOLIC PANEL
Anion gap: 10 (ref 5–15)
BUN: 18 mg/dL (ref 6–20)
CO2: 23 mmol/L (ref 22–32)
Calcium: 8.9 mg/dL (ref 8.9–10.3)
Chloride: 106 mmol/L (ref 98–111)
Creatinine, Ser: 1.39 mg/dL — ABNORMAL HIGH (ref 0.61–1.24)
GFR, Estimated: 59 mL/min — ABNORMAL LOW (ref >60)
Glucose, Bld: 107 mg/dL — ABNORMAL HIGH (ref 70–99)
Potassium: 3.6 mmol/L (ref 3.5–5.1)
Sodium: 139 mmol/L (ref 135–145)

## 2021-04-05 LAB — GLUCOSE, CAPILLARY
Glucose-Capillary: 102 mg/dL — ABNORMAL HIGH (ref 70–99)
Glucose-Capillary: 108 mg/dL — ABNORMAL HIGH (ref 70–99)
Glucose-Capillary: 98 mg/dL (ref 70–99)
Glucose-Capillary: 106 mg/dL — ABNORMAL HIGH (ref 70–99)
Glucose-Capillary: 114 mg/dL — ABNORMAL HIGH (ref 70–99)
Glucose-Capillary: 110 mg/dL — ABNORMAL HIGH (ref 70–99)
Glucose-Capillary: 131 mg/dL — ABNORMAL HIGH (ref 70–99)
Glucose-Capillary: 109 mg/dL — ABNORMAL HIGH (ref 70–99)

## 2021-04-05 LAB — POCT I-STAT EG7
Acid-base deficit: 2 mmol/L (ref 0.0–2.0)
Bicarbonate: 24.1 mmol/L (ref 20.0–28.0)
Calcium, Ion: 1.09 mmol/L — ABNORMAL LOW (ref 1.15–1.40)
HCT: 33 % — ABNORMAL LOW (ref 39.0–52.0)
Hemoglobin: 11.2 g/dL — ABNORMAL LOW (ref 13.0–17.0)
O2 Saturation: 75 %
Potassium: 4.1 mmol/L (ref 3.5–5.1)
Sodium: 141 mmol/L (ref 135–145)
TCO2: 25 mmol/L (ref 22–32)
pCO2, Ven: 45.9 mmHg (ref 44.0–60.0)
pH, Ven: 7.327 (ref 7.250–7.430)
pO2, Ven: 43 mmHg (ref 32.0–45.0)

## 2021-04-05 LAB — PREPARE RBC (CROSSMATCH)

## 2021-04-05 LAB — HEPARIN LEVEL (UNFRACTIONATED): Heparin Unfractionated: 0.56 IU/mL (ref 0.30–0.70)

## 2021-04-05 LAB — HEMOGLOBIN AND HEMATOCRIT, BLOOD
HCT: 32.8 % — ABNORMAL LOW (ref 39.0–52.0)
Hemoglobin: 11 g/dL — ABNORMAL LOW (ref 13.0–17.0)

## 2021-04-05 LAB — PLATELET COUNT: Platelets: 168 10*3/uL (ref 150–400)

## 2021-04-05 LAB — MAGNESIUM: Magnesium: 2.3 mg/dL (ref 1.7–2.4)

## 2021-04-05 LAB — APTT: aPTT: 32 seconds (ref 24–36)

## 2021-04-05 LAB — PROTIME-INR
INR: 1.4 — ABNORMAL HIGH (ref 0.8–1.2)
Prothrombin Time: 17.5 seconds — ABNORMAL HIGH (ref 11.4–15.2)

## 2021-04-05 SURGERY — CORONARY ARTERY BYPASS GRAFTING (CABG)
Anesthesia: General | Site: Chest

## 2021-04-05 MED ORDER — EPHEDRINE SULFATE-NACL 50-0.9 MG/10ML-% IV SOSY
PREFILLED_SYRINGE | INTRAVENOUS | Status: DC | PRN
  Administered 2021-04-05: 10 mg via INTRAVENOUS

## 2021-04-05 MED ORDER — FENTANYL CITRATE (PF) 250 MCG/5ML IJ SOLN
INTRAMUSCULAR | Status: DC | PRN
  Administered 2021-04-05: 100 ug via INTRAVENOUS
  Administered 2021-04-05: 50 ug via INTRAVENOUS
  Administered 2021-04-05 (×3): 100 ug via INTRAVENOUS
  Administered 2021-04-05: 50 ug via INTRAVENOUS
  Administered 2021-04-05: 200 ug via INTRAVENOUS
  Administered 2021-04-05: 150 ug via INTRAVENOUS
  Administered 2021-04-05 (×2): 100 ug via INTRAVENOUS
  Administered 2021-04-05: 150 ug via INTRAVENOUS
  Administered 2021-04-05: 50 ug via INTRAVENOUS

## 2021-04-05 MED ORDER — PANTOPRAZOLE SODIUM 40 MG PO TBEC: 40.0000 mg | DELAYED_RELEASE_TABLET | Freq: Every day | ORAL | Status: DC

## 2021-04-05 MED ORDER — MIDAZOLAM HCL 2 MG/2ML IJ SOLN: 2.0000 mg | INTRAMUSCULAR | Status: DC | PRN

## 2021-04-05 MED ORDER — SODIUM CHLORIDE (PF) 0.9 % IJ SOLN
INTRAMUSCULAR | Status: AC
  Filled 2021-04-05: qty 10

## 2021-04-05 MED ORDER — ALBUMIN HUMAN 5 % IV SOLN: 250.0000 mL | INTRAVENOUS | Status: AC | PRN

## 2021-04-05 MED ORDER — MIDAZOLAM HCL (PF) 5 MG/ML IJ SOLN
INTRAMUSCULAR | Status: DC | PRN
  Administered 2021-04-05 (×5): 2 mg via INTRAVENOUS

## 2021-04-05 MED ORDER — SODIUM CHLORIDE (PF) 0.9 % IJ SOLN
OROMUCOSAL | Status: DC | PRN
  Administered 2021-04-05 (×3): 4 mL via TOPICAL

## 2021-04-05 MED ORDER — ACETAMINOPHEN 160 MG/5ML PO SOLN: 1000.0000 mg | Freq: Four times a day (QID) | ORAL | Status: AC

## 2021-04-05 MED ORDER — PROTAMINE SULFATE 10 MG/ML IV SOLN
INTRAVENOUS | Status: AC
  Filled 2021-04-05: qty 10

## 2021-04-05 MED ORDER — TRAMADOL HCL 50 MG PO TABS
50.0000 mg | ORAL_TABLET | ORAL | Status: DC | PRN
  Administered 2021-04-06 – 2021-04-07 (×5): 100 mg via ORAL
  Filled 2021-04-05 (×5): qty 2

## 2021-04-05 MED ORDER — LACTATED RINGERS IV SOLN: INTRAVENOUS | Status: DC

## 2021-04-05 MED ORDER — INSULIN REGULAR(HUMAN) IN NACL 100-0.9 UT/100ML-% IV SOLN: INTRAVENOUS | Status: DC

## 2021-04-05 MED ORDER — LACTATED RINGERS IV SOLN: INTRAVENOUS | Status: DC | PRN

## 2021-04-05 MED ORDER — BISACODYL 5 MG PO TBEC
10.0000 mg | DELAYED_RELEASE_TABLET | Freq: Every day | ORAL | Status: DC
  Administered 2021-04-06 – 2021-04-07 (×2): 10 mg via ORAL
  Filled 2021-04-05 (×2): qty 2

## 2021-04-05 MED ORDER — PROTAMINE SULFATE 10 MG/ML IV SOLN
INTRAVENOUS | Status: AC
  Filled 2021-04-05: qty 50

## 2021-04-05 MED ORDER — SODIUM CHLORIDE 0.9 % IV SOLN
250.0000 mL | INTRAVENOUS | Status: DC
Start: 2021-04-06 — End: 2021-04-12

## 2021-04-05 MED ORDER — ROCURONIUM BROMIDE 10 MG/ML (PF) SYRINGE
PREFILLED_SYRINGE | INTRAVENOUS | Status: DC | PRN
  Administered 2021-04-05: 100 mg via INTRAVENOUS
  Administered 2021-04-05 (×4): 50 mg via INTRAVENOUS

## 2021-04-05 MED ORDER — SODIUM CHLORIDE 0.9 % IV SOLN: INTRAVENOUS | Status: DC

## 2021-04-05 MED ORDER — FAMOTIDINE IN NACL 20-0.9 MG/50ML-% IV SOLN
20.0000 mg | Freq: Two times a day (BID) | INTRAVENOUS | Status: AC
  Administered 2021-04-05 (×2): 20 mg via INTRAVENOUS
  Filled 2021-04-05 (×2): qty 50

## 2021-04-05 MED ORDER — SODIUM CHLORIDE 0.9% FLUSH
3.0000 mL | Freq: Two times a day (BID) | INTRAVENOUS | Status: DC
  Administered 2021-04-06 – 2021-04-10 (×8): 3 mL via INTRAVENOUS

## 2021-04-05 MED ORDER — ONDANSETRON HCL 4 MG/2ML IJ SOLN
4.0000 mg | Freq: Four times a day (QID) | INTRAMUSCULAR | Status: DC | PRN
  Administered 2021-04-06: 4 mg via INTRAVENOUS
  Filled 2021-04-05 (×2): qty 2

## 2021-04-05 MED ORDER — 0.9 % SODIUM CHLORIDE (POUR BTL) OPTIME
TOPICAL | Status: DC | PRN
  Administered 2021-04-05: 5000 mL

## 2021-04-05 MED ORDER — FENTANYL CITRATE (PF) 250 MCG/5ML IJ SOLN
INTRAMUSCULAR | Status: AC
  Filled 2021-04-05: qty 25

## 2021-04-05 MED ORDER — METOPROLOL TARTRATE 12.5 MG HALF TABLET
12.5000 mg | ORAL_TABLET | Freq: Two times a day (BID) | ORAL | Status: DC
  Administered 2021-04-06 – 2021-04-07 (×3): 12.5 mg via ORAL
  Filled 2021-04-05 (×4): qty 1

## 2021-04-05 MED ORDER — PLASMA-LYTE A IV SOLN
INTRAVENOUS | Status: DC | PRN
  Administered 2021-04-05: 1000 mL via INTRAVASCULAR

## 2021-04-05 MED ORDER — ROCURONIUM BROMIDE 10 MG/ML (PF) SYRINGE
PREFILLED_SYRINGE | INTRAVENOUS | Status: AC
  Filled 2021-04-05: qty 20

## 2021-04-05 MED ORDER — PHENYLEPHRINE 40 MCG/ML (10ML) SYRINGE FOR IV PUSH (FOR BLOOD PRESSURE SUPPORT)
PREFILLED_SYRINGE | INTRAVENOUS | Status: AC
  Filled 2021-04-05: qty 10

## 2021-04-05 MED ORDER — CEFAZOLIN SODIUM-DEXTROSE 2-4 GM/100ML-% IV SOLN
2.0000 g | Freq: Three times a day (TID) | INTRAVENOUS | Status: AC
Start: 2021-04-05 — End: 2021-04-07
  Administered 2021-04-05 – 2021-04-07 (×6): 2 g via INTRAVENOUS
  Filled 2021-04-05 (×6): qty 100

## 2021-04-05 MED ORDER — CHLORHEXIDINE GLUCONATE 0.12 % MT SOLN
15.0000 mL | OROMUCOSAL | Status: AC
  Administered 2021-04-05: 15 mL via OROMUCOSAL

## 2021-04-05 MED ORDER — HEPARIN SODIUM (PORCINE) 1000 UNIT/ML IJ SOLN
INTRAMUSCULAR | Status: AC
  Filled 2021-04-05: qty 1

## 2021-04-05 MED ORDER — ASPIRIN 81 MG PO CHEW: 324.0000 mg | CHEWABLE_TABLET | Freq: Every day | ORAL | Status: DC

## 2021-04-05 MED ORDER — PROPOFOL 10 MG/ML IV BOLUS
INTRAVENOUS | Status: AC
  Filled 2021-04-05: qty 20

## 2021-04-05 MED ORDER — NITROGLYCERIN 0.2 MG/ML ON CALL CATH LAB
INTRAVENOUS | Status: DC | PRN
  Administered 2021-04-05: 40 ug via INTRAVENOUS
  Administered 2021-04-05: 80 ug via INTRAVENOUS
  Administered 2021-04-05: 40 ug via INTRAVENOUS

## 2021-04-05 MED ORDER — ACETAMINOPHEN 160 MG/5ML PO SOLN: 650.0000 mg | Freq: Once | ORAL | Status: AC

## 2021-04-05 MED ORDER — ACETAMINOPHEN 500 MG PO TABS
1000.0000 mg | ORAL_TABLET | Freq: Four times a day (QID) | ORAL | Status: AC
Start: 2021-04-06 — End: 2021-04-10
  Administered 2021-04-06 – 2021-04-10 (×17): 1000 mg via ORAL
  Filled 2021-04-05 (×16): qty 2

## 2021-04-05 MED ORDER — NOREPINEPHRINE 4 MG/250ML-% IV SOLN
0.0000 ug/min | INTRAVENOUS | Status: DC
Start: 2021-04-05 — End: 2021-04-06
  Administered 2021-04-05: 2 ug/min via INTRAVENOUS
  Administered 2021-04-05: 0 ug/min via INTRAVENOUS

## 2021-04-05 MED ORDER — EPHEDRINE 5 MG/ML INJ
INTRAVENOUS | Status: AC
  Filled 2021-04-05: qty 5

## 2021-04-05 MED ORDER — BISACODYL 10 MG RE SUPP: 10.0000 mg | Freq: Every day | RECTAL | Status: DC

## 2021-04-05 MED ORDER — NICARDIPINE HCL IN NACL 20-0.86 MG/200ML-% IV SOLN
0.0000 mg/h | INTRAVENOUS | Status: DC
  Administered 2021-04-05 (×2): 2.5 mg/h via INTRAVENOUS
  Filled 2021-04-05 (×3): qty 200

## 2021-04-05 MED ORDER — PROTAMINE SULFATE 10 MG/ML IV SOLN
INTRAVENOUS | Status: DC | PRN
  Administered 2021-04-05: 500 mg via INTRAVENOUS

## 2021-04-05 MED ORDER — PROPOFOL 10 MG/ML IV BOLUS
INTRAVENOUS | Status: DC | PRN
  Administered 2021-04-05: 100 mg via INTRAVENOUS
  Administered 2021-04-05 (×2): 30 mg via INTRAVENOUS

## 2021-04-05 MED ORDER — ROCURONIUM BROMIDE 10 MG/ML (PF) SYRINGE
PREFILLED_SYRINGE | INTRAVENOUS | Status: AC
  Filled 2021-04-05: qty 10

## 2021-04-05 MED ORDER — SODIUM CHLORIDE 0.45 % IV SOLN: INTRAVENOUS | Status: DC | PRN

## 2021-04-05 MED ORDER — DEXMEDETOMIDINE HCL IN NACL 400 MCG/100ML IV SOLN
0.0000 ug/kg/h | INTRAVENOUS | Status: DC
  Administered 2021-04-05: 0.03 ug/kg/h via INTRAVENOUS
  Administered 2021-04-06: 0.3 ug/kg/h via INTRAVENOUS
  Filled 2021-04-05 (×2): qty 100

## 2021-04-05 MED ORDER — POTASSIUM CHLORIDE 10 MEQ/50ML IV SOLN: 10.0000 meq | INTRAVENOUS | Status: AC

## 2021-04-05 MED ORDER — METOPROLOL TARTRATE 5 MG/5ML IV SOLN
2.5000 mg | INTRAVENOUS | Status: DC | PRN
  Administered 2021-04-07: 2.5 mg via INTRAVENOUS
  Filled 2021-04-05: qty 5

## 2021-04-05 MED ORDER — CHLORHEXIDINE GLUCONATE CLOTH 2 % EX PADS
6.0000 | MEDICATED_PAD | Freq: Every day | CUTANEOUS | Status: DC
  Administered 2021-04-05 – 2021-04-10 (×4): 6 via TOPICAL

## 2021-04-05 MED ORDER — ALBUMIN HUMAN 5 % IV SOLN
INTRAVENOUS | Status: DC | PRN
Start: 2021-04-05 — End: 2021-04-05

## 2021-04-05 MED ORDER — ASPIRIN EC 325 MG PO TBEC: 325.0000 mg | DELAYED_RELEASE_TABLET | Freq: Every day | ORAL | Status: DC

## 2021-04-05 MED ORDER — MIDAZOLAM HCL (PF) 10 MG/2ML IJ SOLN
INTRAMUSCULAR | Status: AC
  Filled 2021-04-05: qty 2

## 2021-04-05 MED ORDER — DOBUTAMINE IN D5W 4-5 MG/ML-% IV SOLN
0.0000 ug/kg/min | INTRAVENOUS | Status: DC
Start: 2021-04-05 — End: 2021-04-07

## 2021-04-05 MED ORDER — ACETAMINOPHEN 650 MG RE SUPP
650.0000 mg | Freq: Once | RECTAL | Status: AC
  Administered 2021-04-05: 650 mg via RECTAL

## 2021-04-05 MED ORDER — SODIUM CHLORIDE 0.9% FLUSH: 3.0000 mL | INTRAVENOUS | Status: DC | PRN

## 2021-04-05 MED ORDER — LACTATED RINGERS IV SOLN: 500.0000 mL | Freq: Once | INTRAVENOUS | Status: DC | PRN

## 2021-04-05 MED ORDER — FENTANYL CITRATE (PF) 100 MCG/2ML IJ SOLN
50.0000 ug | INTRAMUSCULAR | Status: DC | PRN
  Administered 2021-04-05 – 2021-04-06 (×5): 100 ug via INTRAVENOUS
  Filled 2021-04-05 (×5): qty 2

## 2021-04-05 MED ORDER — DEXTROSE 50 % IV SOLN: 0.0000 mL | INTRAVENOUS | Status: DC | PRN

## 2021-04-05 MED ORDER — FENTANYL CITRATE PF 50 MCG/ML IJ SOSY: 50.0000 ug | PREFILLED_SYRINGE | INTRAMUSCULAR | Status: DC | PRN

## 2021-04-05 MED ORDER — MAGNESIUM SULFATE 4 GM/100ML IV SOLN
4.0000 g | Freq: Once | INTRAVENOUS | Status: AC
  Administered 2021-04-05: 4 g via INTRAVENOUS
  Filled 2021-04-05: qty 100

## 2021-04-05 MED ORDER — DOCUSATE SODIUM 100 MG PO CAPS
200.0000 mg | ORAL_CAPSULE | Freq: Every day | ORAL | Status: DC
  Administered 2021-04-06 – 2021-04-07 (×2): 200 mg via ORAL
  Filled 2021-04-05 (×2): qty 2

## 2021-04-05 MED ORDER — HEPARIN SODIUM (PORCINE) 1000 UNIT/ML IJ SOLN
INTRAMUSCULAR | Status: DC | PRN
  Administered 2021-04-05: 58000 [IU] via INTRAVENOUS

## 2021-04-05 MED ORDER — METOPROLOL TARTRATE 25 MG/10 ML ORAL SUSPENSION
12.5000 mg | Freq: Two times a day (BID) | ORAL | Status: DC
  Filled 2021-04-05: qty 5

## 2021-04-05 MED ORDER — NITROGLYCERIN IN D5W 200-5 MCG/ML-% IV SOLN: 0.0000 ug/min | INTRAVENOUS | Status: DC

## 2021-04-05 MED ORDER — PHENYLEPHRINE 40 MCG/ML (10ML) SYRINGE FOR IV PUSH (FOR BLOOD PRESSURE SUPPORT)
PREFILLED_SYRINGE | INTRAVENOUS | Status: DC | PRN
  Administered 2021-04-05: 80 ug via INTRAVENOUS

## 2021-04-05 MED ORDER — VANCOMYCIN HCL IN DEXTROSE 1-5 GM/200ML-% IV SOLN
1000.0000 mg | Freq: Once | INTRAVENOUS | Status: AC
  Administered 2021-04-05: 1000 mg via INTRAVENOUS
  Filled 2021-04-05: qty 200

## 2021-04-05 SURGICAL SUPPLY — 117 items
APPLIER CLIP 11 MED OPEN (CLIP) ×5
APPLIER CLIP 9.375 SM OPEN (CLIP) ×5
BAG DECANTER FOR FLEXI CONT (MISCELLANEOUS) ×5 IMPLANT
BATTERY MAXDRIVER (MISCELLANEOUS) ×5 IMPLANT
BLADE CLIPPER SURG (BLADE) ×10 IMPLANT
BLADE STERNUM SYSTEM 6 (BLADE) ×5 IMPLANT
BLADE SURG 15 STRL LF DISP TIS (BLADE) ×6 IMPLANT
BLADE SURG 15 STRL SS (BLADE) ×4
BLANKET WARM CARDIAC ADLT BAIR (MISCELLANEOUS) ×5 IMPLANT
BLOOD HAEMOCONCENTR 700 MIDI (MISCELLANEOUS) ×5 IMPLANT
BNDG ELASTIC 4X5.8 VLCR STR LF (GAUZE/BANDAGES/DRESSINGS) ×10 IMPLANT
BNDG ELASTIC 6X10 VLCR STRL LF (GAUZE/BANDAGES/DRESSINGS) ×5 IMPLANT
BNDG ELASTIC 6X5.8 VLCR STR LF (GAUZE/BANDAGES/DRESSINGS) ×5 IMPLANT
BNDG GAUZE ELAST 4 BULKY (GAUZE/BANDAGES/DRESSINGS) ×15 IMPLANT
CABLE SURGICAL S-101-97-12 (CABLE) ×5 IMPLANT
CANISTER SUCT 3000ML PPV (MISCELLANEOUS) ×5 IMPLANT
CANISTER WOUNDNEG PRESSURE 500 (CANNISTER) ×5 IMPLANT
CANNULA MC2 2 STG 36/46 NON-V (CANNULA) ×3 IMPLANT
CANNULA NON VENT 22FR 12 (CANNULA) ×5 IMPLANT
CANNULA VENOUS 2 STG 34/46 (CANNULA) ×2
CATH ROBINSON RED A/P 18FR (CATHETERS) ×10 IMPLANT
CLIP APPLIE 11 MED OPEN (CLIP) ×3 IMPLANT
CLIP APPLIE 9.375 SM OPEN (CLIP) ×3 IMPLANT
CLIP RETRACTION 3.0MM CORONARY (MISCELLANEOUS) IMPLANT
CLIP TI WIDE RED SMALL 24 (CLIP) ×5 IMPLANT
CLIP VESOCCLUDE MED 24/CT (CLIP) IMPLANT
CLIP VESOCCLUDE SM WIDE 24/CT (CLIP) IMPLANT
CONN ST 1/2X1/2  BEN (MISCELLANEOUS) ×2
CONN ST 1/2X1/2 BEN (MISCELLANEOUS) ×3 IMPLANT
CONNECTOR BLAKE 2:1 CARIO BLK (MISCELLANEOUS) ×5 IMPLANT
CONTAINER PROTECT SURGISLUSH (MISCELLANEOUS) ×15 IMPLANT
COVER MAYO STAND STRL (DRAPES) ×5 IMPLANT
DERMABOND ADVANCED (GAUZE/BANDAGES/DRESSINGS) ×2
DERMABOND ADVANCED .7 DNX12 (GAUZE/BANDAGES/DRESSINGS) ×3 IMPLANT
DRAIN CHANNEL 19F RND (DRAIN) ×30 IMPLANT
DRAIN CONNECTOR BLAKE 1:1 (MISCELLANEOUS) ×10 IMPLANT
DRAPE CARDIOVASCULAR INCISE (DRAPES) ×2
DRAPE EXTREMITY T 121X128X90 (DISPOSABLE) ×5 IMPLANT
DRAPE HALF SHEET 40X57 (DRAPES) ×5 IMPLANT
DRAPE INCISE IOBAN 66X45 STRL (DRAPES) ×5 IMPLANT
DRAPE SRG 135X102X78XABS (DRAPES) ×3 IMPLANT
DRAPE WARM FLUID 44X44 (DRAPES) ×5 IMPLANT
DRESSING PEEL AND PLAC PRVNA20 (GAUZE/BANDAGES/DRESSINGS) ×3 IMPLANT
DRSG AQUACEL AG ADV 3.5X10 (GAUZE/BANDAGES/DRESSINGS) ×5 IMPLANT
DRSG AQUACEL AG ADV 3.5X14 (GAUZE/BANDAGES/DRESSINGS) ×5 IMPLANT
DRSG COVADERM 4X14 (GAUZE/BANDAGES/DRESSINGS) ×5 IMPLANT
DRSG PEEL AND PLACE PREVENA 20 (GAUZE/BANDAGES/DRESSINGS) ×5
ELECT BLADE 4.0 EZ CLEAN MEGAD (MISCELLANEOUS) ×10
ELECT REM PT RETURN 9FT ADLT (ELECTROSURGICAL) ×10
ELECTRODE BLDE 4.0 EZ CLN MEGD (MISCELLANEOUS) ×6 IMPLANT
ELECTRODE REM PT RTRN 9FT ADLT (ELECTROSURGICAL) ×6 IMPLANT
FELT TEFLON 1X6 (MISCELLANEOUS) ×5 IMPLANT
GAUZE 4X4 16PLY ~~LOC~~+RFID DBL (SPONGE) ×5 IMPLANT
GAUZE SPONGE 4X4 12PLY STRL (GAUZE/BANDAGES/DRESSINGS) ×10 IMPLANT
GAUZE SPONGE 4X4 12PLY STRL LF (GAUZE/BANDAGES/DRESSINGS) ×5 IMPLANT
GEL ULTRASOUND 20GR AQUASONIC (MISCELLANEOUS) ×5 IMPLANT
GLOVE SURG ENC MOIS LTX SZ7 (GLOVE) ×10 IMPLANT
GLOVE SURG ENC TEXT LTX SZ7.5 (GLOVE) ×10 IMPLANT
GLOVE SURG MICRO LTX SZ6.5 (GLOVE) ×20 IMPLANT
GLOVE SURG PR MICRO ENCORE 7.5 (GLOVE) ×5 IMPLANT
GLOVE SURG UNDER POLY LF SZ6 (GLOVE) ×15 IMPLANT
GLOVE SURG UNDER POLY LF SZ6.5 (GLOVE) ×25 IMPLANT
GLOVE SURG UNDER POLY LF SZ7 (GLOVE) ×5 IMPLANT
GLOVE SURG UNDER POLY LF SZ7.5 (GLOVE) ×5 IMPLANT
GLOVE SURG UNDER POLY LF SZ9 (GLOVE) ×5 IMPLANT
GOWN STRL REUS W/ TWL LRG LVL3 (GOWN DISPOSABLE) ×30 IMPLANT
GOWN STRL REUS W/ TWL XL LVL3 (GOWN DISPOSABLE) ×6 IMPLANT
GOWN STRL REUS W/TWL LRG LVL3 (GOWN DISPOSABLE) ×20
GOWN STRL REUS W/TWL XL LVL3 (GOWN DISPOSABLE) ×4
HEMOSTAT POWDER SURGIFOAM 1G (HEMOSTASIS) ×15 IMPLANT
INSERT FOGARTY XLG (MISCELLANEOUS) ×5 IMPLANT
INSERT SUTURE HOLDER (MISCELLANEOUS) ×5 IMPLANT
KIT BASIN OR (CUSTOM PROCEDURE TRAY) ×5 IMPLANT
KIT SUCTION CATH 14FR (SUCTIONS) ×5 IMPLANT
KIT TURNOVER KIT B (KITS) ×10 IMPLANT
KIT VASOVIEW HEMOPRO 2 VH 4000 (KITS) ×5 IMPLANT
LEAD PACING MYOCARDI (MISCELLANEOUS) ×5 IMPLANT
MARKER GRAFT CORONARY BYPASS (MISCELLANEOUS) ×15 IMPLANT
NS IRRIG 1000ML POUR BTL (IV SOLUTION) ×25 IMPLANT
PACK ACCESSORY CANNULA KIT (KITS) ×5 IMPLANT
PACK E OPEN HEART (SUTURE) ×5 IMPLANT
PACK OPEN HEART (CUSTOM PROCEDURE TRAY) ×5 IMPLANT
PAD ARMBOARD 7.5X6 YLW CONV (MISCELLANEOUS) ×20 IMPLANT
PAD ELECT DEFIB RADIOL ZOLL (MISCELLANEOUS) ×5 IMPLANT
PENCIL BUTTON HOLSTER BLD 10FT (ELECTRODE) ×5 IMPLANT
PLATE STERNAL 2.3X208 14H 2-PK (Plate) ×5 IMPLANT
POSITIONER HEAD DONUT 9IN (MISCELLANEOUS) ×5 IMPLANT
PUNCH AORTIC ROTATE 4.0MM (MISCELLANEOUS) ×5 IMPLANT
SCREW STERNAL LOCK 2.3MM (Screw) ×75 IMPLANT
SET MPS 3-ND DEL (MISCELLANEOUS) ×5 IMPLANT
SHEARS HARMONIC 9CM CVD (BLADE) ×10 IMPLANT
SPONGE T-LAP 18X18 ~~LOC~~+RFID (SPONGE) ×30 IMPLANT
SUPPORT HEART JANKE-BARRON (MISCELLANEOUS) ×5 IMPLANT
SUT BONE WAX W31G (SUTURE) ×5 IMPLANT
SUT ETHIBOND X763 2 0 SH 1 (SUTURE) ×10 IMPLANT
SUT MNCRL AB 3-0 PS2 18 (SUTURE) ×10 IMPLANT
SUT PDS AB 1 CTX 36 (SUTURE) ×10 IMPLANT
SUT PROLENE 4 0 SH DA (SUTURE) ×5 IMPLANT
SUT PROLENE 5 0 C 1 36 (SUTURE) ×30 IMPLANT
SUT PROLENE 7 0 BV 1 (SUTURE) ×10 IMPLANT
SUT PROLENE 7 0 BV1 MDA (SUTURE) ×10 IMPLANT
SUT STEEL 6MS V (SUTURE) ×10 IMPLANT
SUT VIC AB 2-0 CT1 27 (SUTURE) ×4
SUT VIC AB 2-0 CT1 TAPERPNT 27 (SUTURE) ×6 IMPLANT
SUT VIC AB 3-0 SH 27 (SUTURE)
SUT VIC AB 3-0 SH 27X BRD (SUTURE) IMPLANT
SUT VIC AB 3-0 X1 27 (SUTURE) ×10 IMPLANT
SYR 50ML SLIP (SYRINGE) IMPLANT
SYSTEM SAHARA CHEST DRAIN ATS (WOUND CARE) ×5 IMPLANT
TAPE CLOTH SURG 4X10 WHT LF (GAUZE/BANDAGES/DRESSINGS) ×5 IMPLANT
TAPE PAPER 2X10 WHT MICROPORE (GAUZE/BANDAGES/DRESSINGS) ×5 IMPLANT
TOWEL GREEN STERILE (TOWEL DISPOSABLE) ×10 IMPLANT
TOWEL GREEN STERILE FF (TOWEL DISPOSABLE) ×10 IMPLANT
TRAY FOLEY SLVR 16FR TEMP STAT (SET/KITS/TRAYS/PACK) ×5 IMPLANT
TUBING LAP HI FLOW INSUFFLATIO (TUBING) ×5 IMPLANT
UNDERPAD 30X36 HEAVY ABSORB (UNDERPADS AND DIAPERS) ×10 IMPLANT
WATER STERILE IRR 1000ML POUR (IV SOLUTION) ×10 IMPLANT

## 2021-04-05 NOTE — Brief Op Note (Signed)
04/02/2021 - 04/05/2021  3:54 PM  PATIENT:  Kevin Lawrence  55 y.o. male  PRE-OPERATIVE DIAGNOSIS:  Coronary artery disease  POST-OPERATIVE DIAGNOSIS:  Coronary artery disease  PROCEDURE:  Procedure(s): CORONARY ARTERY BYPASS GRAFTING (CABG) X 4, USING LEFT INTERNAL MAMMARY ARTERY, LEFT RADIAL ARTERY, AND RIGHT LEG GREATER SAPHENOUS VEIN HARVESTED ENDOSCOPICALLY (N/A) RADIAL ARTERY HARVEST (Left) TRANSESOPHAGEAL ECHOCARDIOGRAM (TEE) (N/A) LIMA-LAD LEFT RADIAL-OM1 SVG-DIAG 1 SVG-DISTAL RCA LEFT RADIAL 40 MIN EVH 45 MIN  SURGEON:  Surgeon(s) and Role:    * Lightfoot, Eliezer Lofts, MD - Primary  PHYSICIAN ASSISTANT: Voncile Schwarz PA-C  ASSISTANTS: RNFA STAFF   ANESTHESIA:   general  EBL:  702 mL   BLOOD ADMINISTERED:none  DRAINS:  LEFT PLEURAL AND MEDIASTINAL CHEST TUBES    LOCAL MEDICATIONS USED:  NONE  SPECIMEN:  No Specimen  DISPOSITION OF SPECIMEN:  N/A  COUNTS:  YES  TOURNIQUET:  * No tourniquets in log *  DICTATION: .Dragon Dictation  PLAN OF CARE: Admit to inpatient   PATIENT DISPOSITION:  ICU - intubated and hemodynamically stable.   Delay start of Pharmacological VTE agent (>24hrs) due to surgical blood loss or risk of bleeding: yes  COMPLICATIONS: NO KNOWN

## 2021-04-05 NOTE — Progress Notes (Signed)
     [address]       [place] [address]             [phone]       No events  Vitals:   04/04/21 2020 04/05/21 0431  BP: (!) 150/84 (!) 134/57  Pulse: 63 79  Resp: 18 16  Temp: 97.7 F (36.5 C) 98.3 F (36.8 C)  SpO2: 97% 96%   Alert NAD Sinus  EWOB  OR today for CABG4 with left radial harvest  Kevin Lawrence O Kevin Lawrence

## 2021-04-05 NOTE — Procedures (Signed)
Extubation Procedure Note  Patient Details:   Name: Kevin Lawrence DOB: Dec 12, 1965 MRN: [id number]   Airway Documentation:  Airway 8 mm (Active)  Secured at (cm) 23 cm 04/05/21 1846  Measured From Lips 04/05/21 1846  Secured Location Right 04/05/21 1846  Secured By Pink Tape 04/05/21 1846  Cuff Pressure (cm H2O) Green OR 18-26 [facility] 04/05/21 1846  Site Condition Dry 04/05/21 1846   Vent end date: (not recorded) Vent end time: (not recorded)   Evaluation  O2 sats: stable throughout Complications: No apparent complications Patient did tolerate procedure well. Bilateral Breath Sounds: Clear, Diminished   Yes  [name] 04/05/2021, 7:28 PM  Positive leak, VC of 860 mL and NIF -30

## 2021-04-05 NOTE — Anesthesia Procedure Notes (Signed)
Arterial Line Insertion Start/End8/06/2021 7:00 AM Performed by: Adria Dill, CRNA, CRNA  Patient location: Pre-op. Preanesthetic checklist: patient identified, IV checked, site marked, risks and benefits discussed, surgical consent, monitors and equipment checked, pre-op evaluation, timeout performed and anesthesia consent Lidocaine 1% used for infiltration and patient sedated Right, radial was placed Catheter size: 20 G Hand hygiene performed  and maximum sterile barriers used   Attempts: 2 Procedure performed without using ultrasound guided technique. Following insertion, dressing applied and Biopatch. Patient tolerated the procedure well with no immediate complications.

## 2021-04-05 NOTE — Progress Notes (Signed)
Notified Dr.Hendrickson of ABG results.  Ok to go ahead and extubate.

## 2021-04-05 NOTE — Plan of Care (Signed)
  Problem: Cardiovascular: Goal: Ability to achieve and maintain adequate cardiovascular perfusion will improve Outcome: Progressing Goal: Vascular access site(s) Level 0-1 will be maintained Outcome: Progressing   

## 2021-04-05 NOTE — Progress Notes (Signed)
Pt placed on Bipap due to increased 02 requirement and increased WOB.  Pt tolerating well.

## 2021-04-05 NOTE — Anesthesia Procedure Notes (Signed)
Procedure Name: Intubation Date/Time: 04/05/2021 8:04 AM Performed by: Trinna Post., CRNA Pre-anesthesia Checklist: Patient identified, Emergency Drugs available, Suction available, Patient being monitored and Timeout performed Patient Re-evaluated:Patient Re-evaluated prior to induction Oxygen Delivery Method: Circle system utilized Preoxygenation: Pre-oxygenation with 100% oxygen Induction Type: IV induction Ventilation: Mask ventilation without difficulty Laryngoscope Size: Mac and 4 Grade View: Grade I Tube type: Oral Tube size: 8.0 mm Number of attempts: 1 Airway Equipment and Method: Stylet Placement Confirmation: ETT inserted through vocal cords under direct vision, positive ETCO2 and breath sounds checked- equal and bilateral Secured at: 23 cm Tube secured with: Tape Dental Injury: Teeth and Oropharynx as per pre-operative assessment

## 2021-04-05 NOTE — Progress Notes (Signed)
Called RT to start vent wean.

## 2021-04-05 NOTE — Progress Notes (Signed)
Patient flipped to CPAP by RT.  Will get ABG at 1905

## 2021-04-05 NOTE — Transfer of Care (Signed)
Immediate Anesthesia Transfer of Care Note  Patient: Kevin Lawrence  Procedure(s) Performed: CORONARY ARTERY BYPASS GRAFTING (CABG) X 4, USING LEFT INTERNAL MAMMARY ARTERY, LEFT RADIAL ARTERY, AND RIGHT LEG GREATER SAPHENOUS VEIN HARVESTED ENDOSCOPICALLY (Chest) RADIAL ARTERY HARVEST (Left: Arm Lower) TRANSESOPHAGEAL ECHOCARDIOGRAM (TEE)  Patient Location: ICU  Anesthesia Type:General  Level of Consciousness: sedated and Patient remains intubated per anesthesia plan  Airway & Oxygen Therapy: Patient remains intubated per anesthesia plan and Patient placed on Ventilator (see vital sign flow sheet for setting)  Post-op Assessment: Report given to RN and Post -op Vital signs reviewed and stable  Post vital signs: Reviewed and stable  Last Vitals:  Vitals Value Taken Time  BP    Temp 36.7 C 04/05/21 1549  Pulse 83 04/05/21 1549  Resp 23 04/05/21 1549  SpO2 96 % 04/05/21 1549  Vitals shown include unvalidated device data.  Last Pain:  Vitals:   04/05/21 0431  TempSrc: Oral  PainSc:       Patients Stated Pain Goal: 0 (04/04/21 2200)  Complications: No notable events documented.

## 2021-04-05 NOTE — Anesthesia Procedure Notes (Signed)
Central Venous Catheter Insertion Performed by: Heather Roberts, MD, anesthesiologist Start/End8/06/2021 7:01 AM, 04/05/2021 7:11 AM Patient location: Pre-op. Preanesthetic checklist: patient identified, IV checked, site marked, risks and benefits discussed, surgical consent, monitors and equipment checked, pre-op evaluation, timeout performed and anesthesia consent Position: Trendelenburg Lidocaine 1% used for infiltration and patient sedated Hand hygiene performed , maximum sterile barriers used  and Seldinger technique used Catheter size: 8.5 Fr Total catheter length 8. Central line was placed.Sheath introducer Procedure performed using ultrasound guided technique. Ultrasound Notes:anatomy identified, needle tip was noted to be adjacent to the nerve/plexus identified, no ultrasound evidence of intravascular and/or intraneural injection and image(s) printed for medical record Attempts: 1 Following insertion, line sutured, dressing applied and Biopatch. Post procedure assessment: blood return through all ports, free fluid flow and no air  Patient tolerated the procedure well with no immediate complications. Additional procedure comments: Triple lumen catheter inserted through the introducer.Marland Kitchen

## 2021-04-05 NOTE — Op Note (Signed)
[address]       [name] [address]             [phone]                                          04/05/2021 Patient:  Kevin Lawrence Pre-Op Dx: NSTEMI   Coronary artery disease   Morbid obesity   Diabetes mellitus   Hypertension   Hyperlipidemia   Chronic renal insufficiency Post-op Dx: Same Procedure: CABG X 4, LIMA LAD, reverse saphenous vein graft to PDA and first diagonal.  Left radial artery to the obtuse marginal Endoscopic greater saphenous vein harvest on the right Open left radial artery harvest Sternal closure with KLS plating.   Surgeon and Role:      * Cammie Faulstich, Eliezer Lofts, MD - Primary    Webb Laws, PA-C- assisting  Anesthesia  general EBL: 1 L ml Blood Administration: None Xclamp Time: 80 min   Drains: 19 F blake drain: R, L, mediastinal  Wires: None Counts: correct   Indications: 55 year old male admitted following an NSTEMI.  Left heart cath showed severe three-vessel coronary artery disease.  He had preserved biventricular function and no significant valvular disease.  Findings: Heavily calcified vessels.  All were good caliber.  The LAD was densely calcified.  The PDA was small but retracted back to the takeoff from the distal RCA.  This was a good quality vessel despite being heavily calcified.  Operative Technique: All invasive lines were placed in pre-op holding.  After the risks, benefits and alternatives were thoroughly discussed, the patient was brought to the operative theatre.  Anesthesia was induced, and the patient was prepped and draped in normal sterile fashion.  An appropriate surgical pause was performed, and pre-operative antibiotics were dosed accordingly. We began with an incision along the left arm for harvesting of the radial artery.  Once this was harvested this incision was closed several layers absorbable suture.  We then continued with simultaneous incisions along the right leg for harvesting of the  greater saphenous vein and the chest for the sternotomy.  In regards to the sternotomy, this was carried down with bovie cautery, and the sternum was divided with a reciprocating saw.  Meticulous hemostasis was obtained.  The left internal thoracic artery was exposed and harvested in in pedicled fashion.  The patient was systemically heparinized, and the artery was divided distally, and placed in a papaverine sponge.    The sternal elevator was removed, and a retractor was placed.  The pericardium was divided in the midline and fashioned into a cradle with pericardial stitches.   After we confirmed an appropriate ACT, the ascending aorta was cannulated in standard fashion.  The right atrial appendage was used for venous cannulation site.  Cardiopulmonary bypass was initiated, and the heart retractor was placed. The cross clamp was applied, and a dose of anterograde cardioplegia was given with good arrest of the heart.  We moved to the posterior wall of the heart, and found a good target on the PDA.  An arteriotomy was made, and the vein graft was anastomosed to it in an end to side fashion.  Next we exposed the lateral wall, and found a good target on the obtuse marginal.  An end to side anastomosis with the radial artery graft was then created.  Next, we exposed  the anterior wall of the heart and identified a good target on diagonal vessel.   An arteriotomy was created.  The vein was anastomosed in an end to side fashion.  Finally, we exposed a good target on the LAD, and fashioned an end to side anastomosis between it and the LITA.  We began to re-warm, and a re-animation dose of cardioplegia was given.  The heart was de-aired, and the cross clamp was removed.  Meticulous hemostasis was obtained.    A partial occludding clamp was then placed on the ascending aorta, and we created an end to side anastomosis between it and the proximal vein grafts.  The proximal sites were marked with rings.  The proximal  portion of the radial artery graft was jumped off the hood of the diagonal vein graft.  Hemostasis was obtained, and we separated from cardiopulmonary bypass without event.  The heparin was reversed with protamine.  Chest tubes and wires were placed, and the sternum was re-approximated with sternal wires.  The soft tissue and skin were re-approximated wth absorbable suture.    The patient tolerated the procedure without any immediate complications, and was transferred to the ICU in guarded condition.  Jeslynn Hollander Keane Scrape

## 2021-04-05 NOTE — Progress Notes (Signed)
  Echocardiogram Echocardiogram Transesophageal has been performed.  Kevin Lawrence 04/05/2021, 8:38 AM

## 2021-04-06 ENCOUNTER — Inpatient Hospital Stay (HOSPITAL_COMMUNITY): Payer: 59

## 2021-04-06 ENCOUNTER — Encounter (HOSPITAL_COMMUNITY): Payer: Self-pay | Admitting: Thoracic Surgery (Cardiothoracic Vascular Surgery)

## 2021-04-06 DIAGNOSIS — I249 Acute ischemic heart disease, unspecified: Secondary | ICD-10-CM | POA: Diagnosis not present

## 2021-04-06 LAB — TYPE AND SCREEN
ABO/RH(D): O POS
Antibody Screen: NEGATIVE
Unit division: 0
Unit division: 0

## 2021-04-06 LAB — CBC
HCT: 35.3 % — ABNORMAL LOW (ref 39.0–52.0)
HCT: 35.1 % — ABNORMAL LOW (ref 39.0–52.0)
Hemoglobin: 12.1 g/dL — ABNORMAL LOW (ref 13.0–17.0)
Hemoglobin: 12.2 g/dL — ABNORMAL LOW (ref 13.0–17.0)
MCH: 31.7 pg (ref 26.0–34.0)
MCH: 32.2 pg (ref 26.0–34.0)
MCHC: 34.3 g/dL (ref 30.0–36.0)
MCHC: 34.8 g/dL (ref 30.0–36.0)
MCV: 92.4 fL (ref 80.0–100.0)
MCV: 92.6 fL (ref 80.0–100.0)
Platelets: 149 10*3/uL — ABNORMAL LOW (ref 150–400)
Platelets: 180 10*3/uL (ref 150–400)
RBC: 3.82 MIL/uL — ABNORMAL LOW (ref 4.22–5.81)
RBC: 3.79 MIL/uL — ABNORMAL LOW (ref 4.22–5.81)
RDW: 13.7 % (ref 11.5–15.5)
RDW: 14.2 % (ref 11.5–15.5)
WBC: 15.2 10*3/uL — ABNORMAL HIGH (ref 4.0–10.5)
WBC: 20.1 10*3/uL — ABNORMAL HIGH (ref 4.0–10.5)
nRBC: 0 % (ref 0.0–0.2)
nRBC: 0 % (ref 0.0–0.2)

## 2021-04-06 LAB — BASIC METABOLIC PANEL
Anion gap: 9 (ref 5–15)
Anion gap: 12 (ref 5–15)
BUN: 20 mg/dL (ref 6–20)
BUN: 22 mg/dL — ABNORMAL HIGH (ref 6–20)
CO2: 18 mmol/L — ABNORMAL LOW (ref 22–32)
CO2: 20 mmol/L — ABNORMAL LOW (ref 22–32)
Calcium: 8 mg/dL — ABNORMAL LOW (ref 8.9–10.3)
Calcium: 8.2 mg/dL — ABNORMAL LOW (ref 8.9–10.3)
Chloride: 110 mmol/L (ref 98–111)
Chloride: 106 mmol/L (ref 98–111)
Creatinine, Ser: 1.28 mg/dL — ABNORMAL HIGH (ref 0.61–1.24)
Creatinine, Ser: 1.42 mg/dL — ABNORMAL HIGH (ref 0.61–1.24)
GFR, Estimated: >60 mL/min (ref >60)
GFR, Estimated: 58 mL/min — ABNORMAL LOW (ref >60)
Glucose, Bld: 115 mg/dL — ABNORMAL HIGH (ref 70–99)
Glucose, Bld: 144 mg/dL — ABNORMAL HIGH (ref 70–99)
Potassium: 3.9 mmol/L (ref 3.5–5.1)
Potassium: 3.7 mmol/L (ref 3.5–5.1)
Sodium: 137 mmol/L (ref 135–145)
Sodium: 138 mmol/L (ref 135–145)

## 2021-04-06 LAB — POCT I-STAT 7, (LYTES, BLD GAS, ICA,H+H)
Acid-base deficit: 6 mmol/L — ABNORMAL HIGH (ref 0.0–2.0)
Bicarbonate: 19 mmol/L — ABNORMAL LOW (ref 20.0–28.0)
Calcium, Ion: 1.19 mmol/L (ref 1.15–1.40)
HCT: 34 % — ABNORMAL LOW (ref 39.0–52.0)
Hemoglobin: 11.6 g/dL — ABNORMAL LOW (ref 13.0–17.0)
O2 Saturation: 94 %
Patient temperature: 97.7
Potassium: 4 mmol/L (ref 3.5–5.1)
Sodium: 140 mmol/L (ref 135–145)
TCO2: 20 mmol/L — ABNORMAL LOW (ref 22–32)
pCO2 arterial: 32.7 mmHg (ref 32.0–48.0)
pH, Arterial: 7.37 (ref 7.350–7.450)
pO2, Arterial: 72 mmHg — ABNORMAL LOW (ref 83.0–108.0)

## 2021-04-06 LAB — GLUCOSE, CAPILLARY
Glucose-Capillary: 100 mg/dL — ABNORMAL HIGH (ref 70–99)
Glucose-Capillary: 111 mg/dL — ABNORMAL HIGH (ref 70–99)
Glucose-Capillary: 113 mg/dL — ABNORMAL HIGH (ref 70–99)
Glucose-Capillary: 115 mg/dL — ABNORMAL HIGH (ref 70–99)
Glucose-Capillary: 119 mg/dL — ABNORMAL HIGH (ref 70–99)
Glucose-Capillary: 119 mg/dL — ABNORMAL HIGH (ref 70–99)
Glucose-Capillary: 122 mg/dL — ABNORMAL HIGH (ref 70–99)
Glucose-Capillary: 125 mg/dL — ABNORMAL HIGH (ref 70–99)
Glucose-Capillary: 125 mg/dL — ABNORMAL HIGH (ref 70–99)
Glucose-Capillary: 142 mg/dL — ABNORMAL HIGH (ref 70–99)
Glucose-Capillary: 170 mg/dL — ABNORMAL HIGH (ref 70–99)
Glucose-Capillary: 197 mg/dL — ABNORMAL HIGH (ref 70–99)

## 2021-04-06 LAB — MAGNESIUM
Magnesium: 2.1 mg/dL (ref 1.7–2.4)
Magnesium: 2 mg/dL (ref 1.7–2.4)

## 2021-04-06 LAB — BPAM RBC
Blood Product Expiration Date: 202209142359
Blood Product Expiration Date: 202209142359
Unit Type and Rh: 5100
Unit Type and Rh: 5100

## 2021-04-06 MED ORDER — INSULIN DETEMIR 100 UNIT/ML ~~LOC~~ SOLN
25.0000 [IU] | Freq: Every day | SUBCUTANEOUS | Status: DC
  Administered 2021-04-07: 25 [IU] via SUBCUTANEOUS
  Filled 2021-04-06: qty 0.25

## 2021-04-06 MED ORDER — INSULIN ASPART 100 UNIT/ML IJ SOLN
0.0000 [IU] | INTRAMUSCULAR | Status: DC
  Administered 2021-04-06 (×2): 2 [IU] via SUBCUTANEOUS
  Administered 2021-04-06 – 2021-04-07 (×4): 4 [IU] via SUBCUTANEOUS
  Administered 2021-04-07: 8 [IU] via SUBCUTANEOUS
  Administered 2021-04-07: 3 [IU] via SUBCUTANEOUS
  Administered 2021-04-07: 8 [IU] via SUBCUTANEOUS
  Administered 2021-04-08: 4 [IU] via SUBCUTANEOUS
  Administered 2021-04-08: 8 [IU] via SUBCUTANEOUS
  Administered 2021-04-08: 4 [IU] via SUBCUTANEOUS
  Administered 2021-04-08: 8 [IU] via SUBCUTANEOUS

## 2021-04-06 MED ORDER — PANTOPRAZOLE SODIUM 40 MG PO PACK
40.0000 mg | PACK | Freq: Every day | ORAL | Status: DC
  Administered 2021-04-06: 40 mg
  Filled 2021-04-06: qty 20

## 2021-04-06 MED ORDER — FUROSEMIDE 10 MG/ML IJ SOLN
40.0000 mg | Freq: Once | INTRAMUSCULAR | Status: AC
  Administered 2021-04-06: 40 mg via INTRAVENOUS
  Filled 2021-04-06: qty 4

## 2021-04-06 MED ORDER — INSULIN ASPART 100 UNIT/ML IJ SOLN: 0.0000 [IU] | INTRAMUSCULAR | Status: DC

## 2021-04-06 MED ORDER — POTASSIUM CHLORIDE CRYS ER 20 MEQ PO TBCR
20.0000 meq | EXTENDED_RELEASE_TABLET | ORAL | Status: AC
  Administered 2021-04-06 – 2021-04-07 (×3): 20 meq via ORAL
  Filled 2021-04-06 (×3): qty 1

## 2021-04-06 MED ORDER — ASPIRIN 81 MG PO CHEW
324.0000 mg | CHEWABLE_TABLET | Freq: Every day | ORAL | Status: DC
  Administered 2021-04-06: 324 mg via ORAL
  Filled 2021-04-06: qty 4

## 2021-04-06 MED ORDER — INSULIN DETEMIR 100 UNIT/ML ~~LOC~~ SOLN
25.0000 [IU] | Freq: Once | SUBCUTANEOUS | Status: AC
  Administered 2021-04-06: 25 [IU] via SUBCUTANEOUS
  Filled 2021-04-06 (×2): qty 0.25

## 2021-04-06 MED ORDER — ASPIRIN EC 325 MG PO TBEC
325.0000 mg | DELAYED_RELEASE_TABLET | Freq: Every day | ORAL | Status: DC
  Administered 2021-04-07: 325 mg via ORAL
  Filled 2021-04-06: qty 1

## 2021-04-06 MED ORDER — ENOXAPARIN SODIUM 40 MG/0.4ML IJ SOSY
40.0000 mg | PREFILLED_SYRINGE | Freq: Every day | INTRAMUSCULAR | Status: DC
  Administered 2021-04-06 – 2021-04-10 (×5): 40 mg via SUBCUTANEOUS
  Filled 2021-04-06 (×5): qty 0.4

## 2021-04-06 MED ORDER — AMLODIPINE BESYLATE 5 MG PO TABS
5.0000 mg | ORAL_TABLET | Freq: Every day | ORAL | Status: DC
  Administered 2021-04-06 – 2021-04-09 (×4): 5 mg via ORAL
  Filled 2021-04-06 (×4): qty 1

## 2021-04-06 NOTE — Progress Notes (Signed)
      [address]       [place] [address]             [phone]                 1 Day Post-Op Procedure(s) (LRB): CORONARY ARTERY BYPASS GRAFTING (CABG) X 4, USING LEFT INTERNAL MAMMARY ARTERY, LEFT RADIAL ARTERY, AND RIGHT LEG GREATER SAPHENOUS VEIN HARVESTED ENDOSCOPICALLY (N/A) RADIAL ARTERY HARVEST (Left) TRANSESOPHAGEAL ECHOCARDIOGRAM (TEE) (N/A)   Events: Extubated overnight. _______________________________________________________________ Vitals: BP 119/63   Pulse 73   Temp 99.5 F (37.5 C)   Resp 15   Ht 6\' 3"  (1.905 m)   Wt (!) 173.3 kg   SpO2 93%   BMI 47.75 kg/m   - Neuro: Alert NAD  - Cardiovascular: Sinus  Drips: Cardene at 2.5.   CVP:  [1 mmHg-23 mmHg] 14 mmHg  - Pulm: Easy work of breathing   ABG    Component Value Date/Time   PHART 7.370 04/06/2021 0002   PCO2ART 32.7 04/06/2021 0002   PO2ART 72 (L) 04/06/2021 0002   HCO3 19.0 (L) 04/06/2021 0002   TCO2 20 (L) 04/06/2021 0002   ACIDBASEDEF 6.0 (H) 04/06/2021 0002   O2SAT 94.0 04/06/2021 0002    - Abd: Nondistended - Extremity: Warm  .Intake/Output      08/11 0701 08/12 0700 08/12 0701 08/13 0700   P.O.     I.V. (mL/kg) 3946.1 (22.8) 169.3 (1)   Blood 480    IV Piggyback 1414.6    Total Intake(mL/kg) 5840.6 (33.7) 169.3 (1)   Urine (mL/kg/hr) 1400 (0.3) 225 (0.1)   Stool     Blood 702    Chest Tube 410    Total Output 2512 225   Net +3328.6 -55.7           _______________________________________________________________ Labs: CBC Latest Ref Rng & Units 04/06/2021 04/06/2021 04/05/2021  WBC 4.0 - 10.5 K/uL 15.2(H) - 17.6(H)  Hemoglobin 13.0 - 17.0 g/dL 12.1(L) 11.6(L) 12.3(L)  Hematocrit 39.0 - 52.0 % 35.3(L) 34.0(L) 36.8(L)  Platelets 150 - 400 K/uL 149(L) - 153   CMP Latest Ref Rng & Units 04/06/2021 04/06/2021 04/05/2021  Glucose 70 - 99 mg/dL 06/05/2021) - 563(J)  BUN 6 - 20 mg/dL 20 - 19  Creatinine 497(W - 1.24 mg/dL 2.63) - 7.85(Y)  Sodium 135 - 145  mmol/L 137 140 138  Potassium 3.5 - 5.1 mmol/L 3.9 4.0 4.0  Chloride 98 - 111 mmol/L 110 - 110  CO2 22 - 32 mmol/L 18(L) - 20(L)  Calcium 8.9 - 10.3 mg/dL 8.0(L) - 8.1(L)  Total Protein 6.5 - 8.1 g/dL - - -  Total Bilirubin 0.3 - 1.2 mg/dL - - -  Alkaline Phos 38 - 126 U/L - - -  AST 15 - 41 U/L - - -  ALT 0 - 44 U/L - - -    CXR: Pulmonary vascular congestion  _______________________________________________________________  Assessment and Plan: POD 1 s/p CABG x4  Neuro: Pain controlled CV: Transitioning to amlodipine.  On aspirin/statin/ beta-blocker.  We will remove arterial line Pulm: Continue pulmonary toilet Renal: Good urine output.  Creatinine stable GI: Advancing diet Heme: Stable ID: Afebrile Endo: Sliding scale insulin Dispo: Floor tomorrow.   8.50(Y 04/06/2021 3:48 PM

## 2021-04-06 NOTE — Progress Notes (Signed)
Progress Note  Patient Name: Kevin Lawrence R Anspach Date of Encounter: 04/06/2021  [facility] Cardiologist: Garwin Brothersajan R Revankar, MD   Subjective   Chest wall is sore.   Inpatient Medications    Scheduled Meds:  acetaminophen  1,000 mg Oral Q6H   Or   acetaminophen (TYLENOL) oral liquid 160 mg/5 mL  1,000 mg Per Tube Q6H   aspirin  324 mg Oral Daily   Or   aspirin EC  325 mg Oral Daily   bisacodyl  10 mg Oral Daily   Or   bisacodyl  10 mg Rectal Daily   Chlorhexidine Gluconate Cloth  6 each Topical Daily   colesevelam  1,875 mg Oral BID WC   docusate sodium  200 mg Oral Daily   metoprolol tartrate  12.5 mg Oral BID   Or   metoprolol tartrate  12.5 mg Per Tube BID   pantoprazole sodium  40 mg Per Tube Daily   rosuvastatin  40 mg Oral Daily   sodium chloride flush  3 mL Intravenous Q12H   Continuous Infusions:  sodium chloride 10 mL/hr at 04/06/21 0700   sodium chloride     sodium chloride     albumin human      ceFAZolin (ANCEF) IV Stopped (04/06/21 [id number])   dexmedetomidine (PRECEDEX) IV infusion Stopped (04/06/21 0549)   DOBUTamine Stopped (04/05/21 1633)   insulin 1.2 Units/hr (04/06/21 0700)   lactated ringers     lactated ringers Stopped (04/05/21 1544)   lactated ringers 20 mL/hr at 04/06/21 0700   niCARDipine 2.5 mg/hr (04/06/21 0700)   nitroGLYCERIN Stopped (04/05/21 1630)   norepinephrine (LEVOPHED) Adult infusion 2 mcg/min (04/05/21 1842)   PRN Meds: sodium chloride, albumin human, dextrose, fentaNYL (SUBLIMAZE) injection, lactated ringers, metoprolol tartrate, midazolam, ondansetron (ZOFRAN) IV, sodium chloride flush, traMADol   Vital Signs    Vitals:   04/06/21 0630 04/06/21 0645 04/06/21 0700 04/06/21 0800  BP:    123/74  Pulse: 69 69 69 75  Resp: 12 12 12 17   Temp: 98.6 F (37 C) 98.6 F (37 C) 98.8 F (37.1 C) 99.1 F (37.3 C)  TempSrc:      SpO2: 94% 96% 97% 97%  Weight:      Height:        Intake/Output Summary (Last 24 hours) at  04/06/2021 0913 Last data filed at 04/06/2021 0700 Gross per 24 hour  Intake 5440.61 ml  Output 2512 ml  Net 2928.61 ml   Last 3 Weights 04/06/2021 04/04/2021 04/02/2021  Weight (lbs) 382 lb 0.9 oz 367 lb 11.2 oz 380 lb  Weight (kg) 173.3 kg 166.788 kg 172.367 kg      Telemetry    Sinus - Personally Reviewed  ECG     Sinus rhythm, ST elevation anterior leads c/w pericarditis/repol- Personally Reviewed  Physical Exam    General: Well developed, well nourished, NAD  HEENT: OP clear, mucus membranes moist  SKIN: warm, dry. No rashes. Neuro: No focal deficits  Musculoskeletal: Muscle strength 5/5 all ext  Psychiatric: Mood and affect normal  Neck: No JVD, no carotid bruits, no thyromegaly, no lymphadenopathy.  Lungs:Clear bilaterally, no wheezes, rhonci, crackles Cardiovascular: Regular rate and rhythm. No murmurs, gallops or rubs. Abdomen:Soft. Bowel sounds present. Non-tender.  Extremities: Trace bilateral lower extremity edema.    Labs    High Sensitivity Troponin:  No results for input(s): TROPONINIHS in the last 720 hours.    Chemistry Recent Labs  Lab 04/03/21 0618 04/05/21 [id number] 04/05/21 [id number] 04/05/21  1410 04/05/21 1416 04/05/21 2110 04/06/21 0002 04/06/21 0318  NA 136 139   < > 140   < > 138 140 137  K 4.0 3.6   < > 4.2   < > 4.0 4.0 3.9  CL 104 106   < > 106  --  110  --  110  CO2 26 23  --   --   --  20*  --  18*  GLUCOSE 131* 107*   < > 118*  --  130*  --  115*  BUN 14 18   < > 20  --  19  --  20  CREATININE 1.18 1.39*   < > 1.20  --  1.35*  --  1.28*  CALCIUM 8.9 8.9  --   --   --  8.1*  --  8.0*  PROT 6.3*  --   --   --   --   --   --   --   ALBUMIN 3.5  --   --   --   --   --   --   --   AST 28  --   --   --   --   --   --   --   ALT 27  --   --   --   --   --   --   --   ALKPHOS 49  --   --   --   --   --   --   --   BILITOT 1.4*  --   --   --   --   --   --   --   GFRNONAA >60 59*  --   --   --  >60  --  >60  ANIONGAP 6 10  --   --   --  8  --  9    < > = values in this interval not displayed.     Hematology Recent Labs  Lab 04/05/21 1607 04/05/21 1906 04/05/21 2110 04/06/21 0002 04/06/21 0318  WBC 11.5*  --  17.6*  --  15.2*  RBC 3.81*  --  3.93*  --  3.82*  HGB 12.2*   < > 12.3* 11.6* 12.1*  HCT 35.3*   < > 36.8* 34.0* 35.3*  MCV 92.7  --  93.6  --  92.4  MCH 32.0  --  31.3  --  31.7  MCHC 34.6  --  33.4  --  34.3  RDW 13.8  --  13.8  --  13.7  PLT 130*  --  153  --  149*   < > = values in this interval not displayed.    BNPNo results for input(s): BNP, PROBNP in the last 168 hours.   DDimer No results for input(s): DDIMER in the last 168 hours.   Radiology    DG Chest 2 View  Result Date: 04/04/2021 CLINICAL DATA:  Preop bypass EXAM: CHEST - 2 VIEW COMPARISON:  03/30/2021 FINDINGS: Streaky atelectasis or scarring at the right base. No acute consolidation or effusion. Normal cardiac size. No pneumothorax. IMPRESSION: No active cardiopulmonary disease. Streaky scarring or atelectasis at the right base Electronically Signed   By: Jasmine Pang M.D.   On: 04/04/2021 22:47   DG Chest Port 1 View  Result Date: 04/06/2021 CLINICAL DATA:  Status post CABG EXAM: PORTABLE CHEST 1 VIEW COMPARISON:  Chest radiograph 1 day prior FINDINGS: The patient has been extubated. The  enteric catheter has been removed. A right IJ catheter is in place terminating in the SVC. Median sternotomy wires and mediastinal surgical clips are unchanged. The heart is enlarged, unchanged. The mediastinal contours are otherwise within normal limits. Lung volumes are low. Patchy opacities in the left base likely reflect atelectasis. There is no focal opacity. Vascular congestion appears improved. There is no overt pulmonary edema. There is no significant pleural effusion. There is no appreciable pneumothorax. The bones are unremarkable. IMPRESSION: Low lung volumes with improved vascular congestion. Electronically Signed   By: Lesia Hausen MD   On: 04/06/2021  08:52   DG Chest Port 1 View  Result Date: 04/05/2021 CLINICAL DATA:  Status post coronary bypass graft. EXAM: PORTABLE CHEST 1 VIEW COMPARISON:  April 04, 2021. FINDINGS: Stable cardiomediastinal silhouette. Endotracheal and nasogastric tubes are in grossly good position. Right internal jugular catheter is noted with distal tip in expected position of the SVC. No pneumothorax is noted. Hypoinflation of the lungs is noted with mild bibasilar subsegmental atelectasis. Bony thorax is unremarkable. IMPRESSION: Endotracheal and nasogastric tubes are in grossly good position. Hypoinflation of the lungs is noted with mild bibasilar subsegmental atelectasis. Electronically Signed   By: Lupita Raider M.D.   On: 04/05/2021 16:30    Cardiac Studies   Cath: 04/02/21    Ost LAD lesion is 50% stenosed. followed by Prox LAD lesion is 70% stenosed. - RFR POSITIVE (0.86)   1st LPL lesion is 100% stenosed. (Known to be occluded - on previous report noted as Mid LCx   Prox Cx to Mid Cx(-2nd Mrg) lesion is 95% stenosed just priot to stent.   Prox RCA-1 lesion is 90% stenosed just priot to long stented segment.   Prox RCA-2 Stent is 20% stenosed, followed by Mid RCA lesion is 70% stenosis   The left ventricular systolic function is normal.  The left ventricular ejection fraction is 50-55% by visual estimate.   LV end diastolic pressure is normal.   SUMMARY Severe Three-Vessel CAD: Tandem 50 and 70% focal stenosis in the proximal LAD (RFR positive at 0.86) Mid LCx (2nd Mrg) 95% focal stenosis at the proximal edge of previously placed stent; this is just after the takeoff of the AV groove branch with known occlusion of LPL branch. Proximal RCA 90% stenosis just prior to stent with 70% ISR in the mid portion of the RCA stent Normal LVEDP, preserved LVEF     RECOMMENDATIONS With three-vessel disease 2 of which involve in-stent restenosis/proximal and stenosis as well as now RFR positive LAD disease, recommend  CABG. Will admit to inpatient for stabilization and CVTS consultation. Restart IV heparin 8 hours after sheath removal Continue aggressive GM DT medical management for CAD/ACS   Bryan Lemma, MD   Diagnostic Dominance: Right     Patient Profile     55 y.o. male with a history of CAD s/p multiple overlapping stents of the RCA and stenting to OM1 with total occlusion of LCX on cardiac catheterization in 2019, paroxysmal SVT, hypertension, dyslipidemia, uncontrolled type 2 diabetes mellitus, morbid obesity, and remote smoking history (quit 20 years ago) who is transferred from [facility] for further management of chest pain with minimally elevated troponin. Cardiac cath with multi-vessel CAD. He underwent 4V CABG on 04/05/21.   Assessment & Plan    CAD/NSTEMI:  Cardiac cath with multivessel CAD. Doing well post CABG. Continue ASA, statin and beta blocker.    HTN: BP stable.   For questions or updates, please  contact [facility] Please consult [email or web address] for contact info under   Signed, Verne Carrow, MD  04/06/2021, 9:13 AM

## 2021-04-06 NOTE — Discharge Instructions (Addendum)
Discharge Instructions:  1. You may shower, please wash incisions daily with soap and water and keep dry.  If you wish to cover wounds with dressing you may do so but please keep clean and change daily.  No tub baths or swimming until incisions have completely healed.  If your incisions become red or develop any drainage please call our office at [phone]  2. No Driving until cleared by our office and you are no longer using narcotic pain medications  3. Monitor your weight daily.. Please use the same scale and weigh at same time... If you gain 3-5 lbs in 48 hours with associated lower extremity swelling, please contact our office at [phone]  4. Fever of 101.5 for at least 24 hours with no source, please contact our office at [phone]  5. Activity- up as tolerated, please walk at least 3 times per day.  Avoid strenuous activity, no lifting, pushing, or pulling with your arms over 8-10 lbs for a minimum of 6 weeks  6. If any questions or concerns arise, please do not hesitate to contact our office at [phone]    Information on my medicine - ELIQUIS (apixaban)  Why was Eliquis prescribed for you? Eliquis was prescribed for you to reduce the risk of a blood clot forming that can cause a stroke if you have a medical condition called atrial fibrillation (a type of irregular heartbeat).  What do You need to know about Eliquis ? Take your Eliquis TWICE DAILY - one tablet in the morning and one tablet in the evening with or without food. If you have difficulty swallowing the tablet whole please discuss with your pharmacist how to take the medication safely.  Take Eliquis exactly as prescribed by your doctor and DO NOT stop taking Eliquis without talking to the doctor who prescribed the medication.  Stopping may increase your risk of developing a stroke.  Refill your prescription before you run out.  After discharge, you should have regular check-up appointments with your  healthcare provider that is prescribing your Eliquis.  In the future your dose may need to be changed if your kidney function or weight changes by a significant amount or as you get older.  What do you do if you miss a dose? If you miss a dose, take it as soon as you remember on the same day and resume taking twice daily.  Do not take more than one dose of ELIQUIS at the same time to make up a missed dose.  Important Safety Information A possible side effect of Eliquis is bleeding. You should call your healthcare provider right away if you experience any of the following: Bleeding from an injury or your nose that does not stop. Unusual colored urine (red or dark brown) or unusual colored stools (red or black). Unusual bruising for unknown reasons. A serious fall or if you hit your head (even if there is no bleeding).  Some medicines may interact with Eliquis and might increase your risk of bleeding or clotting while on Eliquis. To help avoid this, consult your healthcare provider or pharmacist prior to using any new prescription or non-prescription medications, including herbals, vitamins, non-steroidal anti-inflammatory drugs (NSAIDs) and supplements.  This website has more information on Eliquis (apixaban): http://www.eliquis.com/eliquis/home

## 2021-04-06 NOTE — Anesthesia Postprocedure Evaluation (Signed)
Anesthesia Post Note  Patient: Kevin Lawrence  Procedure(s) Performed: CORONARY ARTERY BYPASS GRAFTING (CABG) X 4, USING LEFT INTERNAL MAMMARY ARTERY, LEFT RADIAL ARTERY, AND RIGHT LEG GREATER SAPHENOUS VEIN HARVESTED ENDOSCOPICALLY (Chest) RADIAL ARTERY HARVEST (Left: Arm Lower) TRANSESOPHAGEAL ECHOCARDIOGRAM (TEE)     Patient location during evaluation: SICU Anesthesia Type: General Level of consciousness: sedated Pain management: pain level controlled Vital Signs Assessment: post-procedure vital signs reviewed and stable Respiratory status: patient remains intubated per anesthesia plan Cardiovascular status: stable Postop Assessment: no apparent nausea or vomiting Anesthetic complications: no   No notable events documented.  Last Vitals:  Vitals:   04/06/21 1705 04/06/21 1800  BP: 137/70 125/72  Pulse: 82 85  Resp: 19 16  Temp:  (!) 38 C  SpO2: 94% 95%    Last Pain:  Vitals:   04/06/21 1634  TempSrc: Oral  PainSc:                  Cecile Hearing

## 2021-04-06 NOTE — Hospital Course (Addendum)
History of Present Illness:      Kevin Lawrence is a 55 year old history of CAD s/p multiple overlapping stents of the RCA and stenting to OM1 with total occlusion of LCX on cardiac catheterization in 2019, paroxysmal SVT, hypetension, dyslipidemia, uncontrolled type 2 diabetes mellitus, morbid obesity, and a remote history of smoking quitting about 20 years ago who presented to [facility] for chest pain. He was having atypical chest pain which occurred at rest which felt like "someone was sticking their finger in his left chest". The episodes were brief but kept reoccurring which is why the patient came to the ED. Troponin was mildly elevated. Dr. [name] saw him and recommended transfer to [facility] for cardiac cath. Cardiac cath showed 50% stenosis of the LAD followed by proximal LAD lesion of 70%. His first LPL is 100% stenosed, proximal circumflex to mid circumflex is 95% just prior to the stent, proximal RCA lesion is 90% stenosed just prior to the long stented segment,  and the proximal RCA 2 stent is 20% stenosed followed by mid RCA lesion of 70% stenosis. LVEF is estimated to be 50-55%. Cardiology consulted [place] for possible surgical revascularization.    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 He lives at home by himself with his 2 cats. He also has a strong family history on his father's side of coronary disease and his sister died after CABG. He had previously had 11 stents placed. For this reason he is nervous about undergoing CABG. Potential risks, benefits, and complications of the surgery were discussed with the patient and he agreed to proceed with surgery. He underwent a CABG x 4 and sternal closure with KLS plating on 04/05/2021.   Hospital Course:  On 04/05/2021 He underwent a CABG x 4 with radial artery and  right leg greater saphenous vein harvest endoscopically with Dr. Kipp Brood. He tolerated the procedure well and was transferred to the surgical ICU for continued care. He was extubated in a timely manner. He was placed on bi pap initially but later weaned to [facility]. He was weaned off Cardene drip. Kevin Lawrence, a line, chest tubes, and foley were all removed early in his post operative course. His creatinine was elevated post op. It went up to 1.75. Over time, creatinine did decreased to 1.13 on 08/17. He was started on Lopressor and this was titrated accordingly. He was started on low dose Amlodipine for radial artery harvest. He was weaned off the Insulin drip. He has a history of diabetes and his pre op HGA1C was 6.5. He was on Victoza, Acotplus Met and Toujeo Solostar prior to surgery. All will be resumed at discharge. He was felt surgically stable for transfer from the ICU to 4E for further convalescence on 08/16. He is ambulating on room. He is tolerating a diet and has had bowel movement. Prevena wound VAC was removed from sternum and lower sternal wound has serous drainage, likely from fat necrosis but no surrounding erythema. He was started on prophylactic oral Keflex. Serous drainage did decrease. A small visible portion of suture was removed from the middle of the sternal wound. LUE and RLE wounds are clean, dry, and healing without signs of infection. Motor/sensory intact LUE wound. Because of PAF, Plavix was stopped and Apixaban was started 08/17. He was given  an additional IV Amiodarone bolus on 08/19 as a fib rate was in the high 90's to low 100's. We are unable to titrate Lopressor further at this time. His sternal drainage has decreased significantly. We continued his zaroxolyn for fluid overload.  Patient is felt surgically stable for discharge today.

## 2021-04-07 ENCOUNTER — Inpatient Hospital Stay (HOSPITAL_COMMUNITY): Payer: 59

## 2021-04-07 LAB — BASIC METABOLIC PANEL
Anion gap: 8 (ref 5–15)
BUN: 25 mg/dL — ABNORMAL HIGH (ref 6–20)
CO2: 24 mmol/L (ref 22–32)
Calcium: 8.3 mg/dL — ABNORMAL LOW (ref 8.9–10.3)
Chloride: 104 mmol/L (ref 98–111)
Creatinine, Ser: 1.63 mg/dL — ABNORMAL HIGH (ref 0.61–1.24)
GFR, Estimated: 49 mL/min — ABNORMAL LOW (ref >60)
Glucose, Bld: 164 mg/dL — ABNORMAL HIGH (ref 70–99)
Potassium: 4.2 mmol/L (ref 3.5–5.1)
Sodium: 136 mmol/L (ref 135–145)

## 2021-04-07 LAB — CBC
HCT: 33.3 % — ABNORMAL LOW (ref 39.0–52.0)
Hemoglobin: 10.9 g/dL — ABNORMAL LOW (ref 13.0–17.0)
MCH: 31.1 pg (ref 26.0–34.0)
MCHC: 32.7 g/dL (ref 30.0–36.0)
MCV: 94.9 fL (ref 80.0–100.0)
Platelets: 167 10*3/uL (ref 150–400)
RBC: 3.51 MIL/uL — ABNORMAL LOW (ref 4.22–5.81)
RDW: 14.6 % (ref 11.5–15.5)
WBC: 18.2 10*3/uL — ABNORMAL HIGH (ref 4.0–10.5)
nRBC: 0 % (ref 0.0–0.2)

## 2021-04-07 LAB — GLUCOSE, CAPILLARY
Glucose-Capillary: 166 mg/dL — ABNORMAL HIGH (ref 70–99)
Glucose-Capillary: 190 mg/dL — ABNORMAL HIGH (ref 70–99)
Glucose-Capillary: 191 mg/dL — ABNORMAL HIGH (ref 70–99)
Glucose-Capillary: 202 mg/dL — ABNORMAL HIGH (ref 70–99)
Glucose-Capillary: 219 mg/dL — ABNORMAL HIGH (ref 70–99)

## 2021-04-07 MED ORDER — INSULIN DETEMIR 100 UNIT/ML ~~LOC~~ SOLN
30.0000 [IU] | Freq: Every day | SUBCUTANEOUS | Status: DC
  Administered 2021-04-08: 30 [IU] via SUBCUTANEOUS
  Filled 2021-04-07: qty 0.3

## 2021-04-07 MED ORDER — METHOCARBAMOL 500 MG PO TABS
500.0000 mg | ORAL_TABLET | Freq: Three times a day (TID) | ORAL | Status: DC
  Administered 2021-04-07: 500 mg via ORAL
  Filled 2021-04-07 (×2): qty 1

## 2021-04-07 MED ORDER — LIDOCAINE 5 % EX PTCH
2.0000 | MEDICATED_PATCH | CUTANEOUS | Status: DC
  Administered 2021-04-07 – 2021-04-15 (×9): 2 via TRANSDERMAL
  Filled 2021-04-07 (×10): qty 2

## 2021-04-07 MED ORDER — AMIODARONE HCL IN DEXTROSE 360-4.14 MG/200ML-% IV SOLN
60.0000 mg/h | INTRAVENOUS | Status: AC
  Administered 2021-04-07: 60 mg/h via INTRAVENOUS
  Filled 2021-04-07: qty 200

## 2021-04-07 MED ORDER — METOPROLOL TARTRATE 25 MG PO TABS
25.0000 mg | ORAL_TABLET | Freq: Two times a day (BID) | ORAL | Status: DC
  Administered 2021-04-07 – 2021-04-08 (×2): 25 mg via ORAL
  Filled 2021-04-07 (×2): qty 1

## 2021-04-07 MED ORDER — AMIODARONE LOAD VIA INFUSION
150.0000 mg | Freq: Once | INTRAVENOUS | Status: AC
  Administered 2021-04-07: 150 mg via INTRAVENOUS
  Filled 2021-04-07: qty 83.34

## 2021-04-07 MED ORDER — ASPIRIN 81 MG PO CHEW
81.0000 mg | CHEWABLE_TABLET | Freq: Every day | ORAL | Status: DC
  Administered 2021-04-08 – 2021-04-12 (×4): 81 mg via ORAL
  Filled 2021-04-07 (×5): qty 1

## 2021-04-07 MED ORDER — AMIODARONE HCL IN DEXTROSE 360-4.14 MG/200ML-% IV SOLN
30.0000 mg/h | INTRAVENOUS | Status: DC
Start: 2021-04-08 — End: 2021-04-10
  Administered 2021-04-08 – 2021-04-09 (×5): 30 mg/h via INTRAVENOUS
  Filled 2021-04-07 (×5): qty 200

## 2021-04-07 MED ORDER — INSULIN DETEMIR 100 UNIT/ML ~~LOC~~ SOLN
5.0000 [IU] | Freq: Once | SUBCUTANEOUS | Status: AC
  Administered 2021-04-07: 5 [IU] via SUBCUTANEOUS
  Filled 2021-04-07: qty 0.05

## 2021-04-07 MED ORDER — CLOPIDOGREL BISULFATE 75 MG PO TABS
75.0000 mg | ORAL_TABLET | Freq: Every day | ORAL | Status: DC
  Administered 2021-04-08 – 2021-04-10 (×3): 75 mg via ORAL
  Filled 2021-04-07 (×4): qty 1

## 2021-04-07 MED ORDER — AMIODARONE HCL IN DEXTROSE 360-4.14 MG/200ML-% IV SOLN
INTRAVENOUS | Status: AC
  Administered 2021-04-07: 60 mg/h via INTRAVENOUS
  Filled 2021-04-07: qty 200

## 2021-04-07 MED ORDER — PANTOPRAZOLE SODIUM 40 MG PO TBEC
40.0000 mg | DELAYED_RELEASE_TABLET | Freq: Every day | ORAL | Status: DC
  Administered 2021-04-07 – 2021-04-16 (×10): 40 mg via ORAL
  Filled 2021-04-07 (×10): qty 1

## 2021-04-07 MED ORDER — ASPIRIN EC 81 MG PO TBEC
81.0000 mg | DELAYED_RELEASE_TABLET | Freq: Every day | ORAL | Status: DC
  Administered 2021-04-09 – 2021-04-16 (×5): 81 mg via ORAL
  Filled 2021-04-07 (×7): qty 1

## 2021-04-07 NOTE — Progress Notes (Signed)
      [address]       [place] [address]             [phone]                 2 Days Post-Op Procedure(s) (LRB): CORONARY ARTERY BYPASS GRAFTING (CABG) X 4, USING LEFT INTERNAL MAMMARY ARTERY, LEFT RADIAL ARTERY, AND RIGHT LEG GREATER SAPHENOUS VEIN HARVESTED ENDOSCOPICALLY (N/A) RADIAL ARTERY HARVEST (Left) TRANSESOPHAGEAL ECHOCARDIOGRAM (TEE) (N/A)   Events: No events _______________________________________________________________ Vitals: BP (!) 150/91   Pulse 80   Temp 97.8 F (36.6 C) (Oral)   Resp 11   Ht 6\' 3"  (1.905 m)   Wt (!) 173.6 kg   SpO2 96%   BMI 47.84 kg/m   - Neuro: Alert NAD  - Cardiovascular: Sinus  Drips:  CVP:  [8 mmHg-15 mmHg] 14 mmHg  - Pulm: Easy work of breathing.  HF [address] @ 10   ABG    Component Value Date/Time   PHART 7.370 04/06/2021 0002   PCO2ART 32.7 04/06/2021 0002   PO2ART 72 (L) 04/06/2021 0002   HCO3 19.0 (L) 04/06/2021 0002   TCO2 20 (L) 04/06/2021 0002   ACIDBASEDEF 6.0 (H) 04/06/2021 0002   O2SAT 94.0 04/06/2021 0002    - Abd: Nondistended - Extremity: Warm  .Intake/Output      08/12 0701 08/13 0700 08/13 0701 08/14 0700   P.O. 50    I.V. (mL/kg) 800.1 (4.6) 59.9 (0.3)   Blood     IV Piggyback 300    Total Intake(mL/kg) 1150.1 (6.6) 59.9 (0.3)   Urine (mL/kg/hr) 1385 (0.3)    Blood     Chest Tube 280 20   Total Output 1665 20   Net -515 +39.9           _______________________________________________________________ Labs: CBC Latest Ref Rng & Units 04/07/2021 04/06/2021 04/06/2021  WBC 4.0 - 10.5 K/uL 18.2(H) 20.1(H) 15.2(H)  Hemoglobin 13.0 - 17.0 g/dL 10.9(L) 12.2(L) 12.1(L)  Hematocrit 39.0 - 52.0 % 33.3(L) 35.1(L) 35.3(L)  Platelets 150 - 400 K/uL 167 180 149(L)   CMP Latest Ref Rng & Units 04/07/2021 04/06/2021 04/06/2021  Glucose 70 - 99 mg/dL 06/06/2021) 295(M) 841(L)  BUN 6 - 20 mg/dL 244(W) 10(U) 20  Creatinine 0.61 - 1.24 mg/dL 72(Z) 3.66(Y) 4.03(K)  Sodium 135 - 145 mmol/L 136  138 137  Potassium 3.5 - 5.1 mmol/L 4.2 3.7 3.9  Chloride 98 - 111 mmol/L 104 106 110  CO2 22 - 32 mmol/L 24 20(L) 18(L)  Calcium 8.9 - 10.3 mg/dL 8.3(L) 8.2(L) 8.0(L)  Total Protein 6.5 - 8.1 g/dL - - -  Total Bilirubin 0.3 - 1.2 mg/dL - - -  Alkaline Phos 38 - 126 U/L - - -  AST 15 - 41 U/L - - -  ALT 0 - 44 U/L - - -    CXR: Pulmonary vascular congestion  _______________________________________________________________  Assessment and Plan: POD 2 s/p CABG x4  Neuro: Pain controlled CV: Transitioning to amlodipine.  On aspirin/statin/ beta-blocker.  Increasing BB. Pulm: Continue pulmonary toilet.  Will remove chest tubes Renal: marginal uop.  Creat up.  Holding diuresis today GI: on diet Heme: Stable ID: Afebrile Endo: Sliding scale insulin Dispo: Floor pending creat.     7.42(V 04/07/2021 10:59 AM

## 2021-04-08 LAB — CBC
HCT: 33.3 % — ABNORMAL LOW (ref 39.0–52.0)
Hemoglobin: 11.2 g/dL — ABNORMAL LOW (ref 13.0–17.0)
MCH: 31.7 pg (ref 26.0–34.0)
MCHC: 33.6 g/dL (ref 30.0–36.0)
MCV: 94.3 fL (ref 80.0–100.0)
Platelets: 189 10*3/uL (ref 150–400)
RBC: 3.53 MIL/uL — ABNORMAL LOW (ref 4.22–5.81)
RDW: 14.6 % (ref 11.5–15.5)
WBC: 16.5 10*3/uL — ABNORMAL HIGH (ref 4.0–10.5)
nRBC: 0 % (ref 0.0–0.2)

## 2021-04-08 LAB — BASIC METABOLIC PANEL
Anion gap: 7 (ref 5–15)
BUN: 36 mg/dL — ABNORMAL HIGH (ref 6–20)
CO2: 23 mmol/L (ref 22–32)
Calcium: 8.6 mg/dL — ABNORMAL LOW (ref 8.9–10.3)
Chloride: 104 mmol/L (ref 98–111)
Creatinine, Ser: 1.75 mg/dL — ABNORMAL HIGH (ref 0.61–1.24)
GFR, Estimated: 45 mL/min — ABNORMAL LOW (ref >60)
Glucose, Bld: 220 mg/dL — ABNORMAL HIGH (ref 70–99)
Potassium: 4.3 mmol/L (ref 3.5–5.1)
Sodium: 134 mmol/L — ABNORMAL LOW (ref 135–145)

## 2021-04-08 LAB — GLUCOSE, CAPILLARY
Glucose-Capillary: 181 mg/dL — ABNORMAL HIGH (ref 70–99)
Glucose-Capillary: 198 mg/dL — ABNORMAL HIGH (ref 70–99)
Glucose-Capillary: 215 mg/dL — ABNORMAL HIGH (ref 70–99)
Glucose-Capillary: 230 mg/dL — ABNORMAL HIGH (ref 70–99)
Glucose-Capillary: 241 mg/dL — ABNORMAL HIGH (ref 70–99)
Glucose-Capillary: 284 mg/dL — ABNORMAL HIGH (ref 70–99)

## 2021-04-08 MED ORDER — INSULIN ASPART 100 UNIT/ML IJ SOLN
12.0000 [IU] | Freq: Once | INTRAMUSCULAR | Status: AC
  Administered 2021-04-08: 12 [IU] via SUBCUTANEOUS

## 2021-04-08 MED ORDER — INSULIN ASPART 100 UNIT/ML IJ SOLN
0.0000 [IU] | Freq: Three times a day (TID) | INTRAMUSCULAR | Status: DC
  Administered 2021-04-09 (×2): 4 [IU] via SUBCUTANEOUS
  Administered 2021-04-09: 15 [IU] via SUBCUTANEOUS
  Administered 2021-04-10 (×2): 7 [IU] via SUBCUTANEOUS
  Administered 2021-04-10: 4 [IU] via SUBCUTANEOUS
  Administered 2021-04-11: 11 [IU] via SUBCUTANEOUS
  Administered 2021-04-11: 7 [IU] via SUBCUTANEOUS
  Administered 2021-04-11: 3 [IU] via SUBCUTANEOUS
  Administered 2021-04-12 (×2): 4 [IU] via SUBCUTANEOUS
  Administered 2021-04-12 – 2021-04-13 (×2): 3 [IU] via SUBCUTANEOUS
  Administered 2021-04-13: 7 [IU] via SUBCUTANEOUS
  Administered 2021-04-14: 4 [IU] via SUBCUTANEOUS
  Administered 2021-04-15: 3 [IU] via SUBCUTANEOUS
  Administered 2021-04-15: 4 [IU] via SUBCUTANEOUS

## 2021-04-08 MED ORDER — INSULIN ASPART 100 UNIT/ML IJ SOLN
0.0000 [IU] | Freq: Three times a day (TID) | INTRAMUSCULAR | Status: DC
  Administered 2021-04-08: 8 [IU] via SUBCUTANEOUS

## 2021-04-08 MED ORDER — INSULIN DETEMIR 100 UNIT/ML ~~LOC~~ SOLN
40.0000 [IU] | Freq: Every day | SUBCUTANEOUS | Status: DC
  Administered 2021-04-09: 40 [IU] via SUBCUTANEOUS
  Filled 2021-04-08 (×2): qty 0.4

## 2021-04-08 MED ORDER — INSULIN ASPART 100 UNIT/ML IJ SOLN
6.0000 [IU] | Freq: Three times a day (TID) | INTRAMUSCULAR | Status: DC
  Administered 2021-04-08: 6 [IU] via SUBCUTANEOUS

## 2021-04-08 MED ORDER — METOPROLOL TARTRATE 50 MG PO TABS
50.0000 mg | ORAL_TABLET | Freq: Two times a day (BID) | ORAL | Status: DC
  Administered 2021-04-08 – 2021-04-16 (×16): 50 mg via ORAL
  Filled 2021-04-08 (×16): qty 1

## 2021-04-08 MED ORDER — INSULIN DETEMIR 100 UNIT/ML ~~LOC~~ SOLN: 40.0000 [IU] | Freq: Every day | SUBCUTANEOUS | Status: DC

## 2021-04-08 MED ORDER — TRAMADOL HCL 50 MG PO TABS: 50.0000 mg | ORAL_TABLET | Freq: Four times a day (QID) | ORAL | Status: DC | PRN

## 2021-04-08 MED ORDER — INSULIN ASPART 100 UNIT/ML IJ SOLN
6.0000 [IU] | Freq: Three times a day (TID) | INTRAMUSCULAR | Status: DC
  Administered 2021-04-09 – 2021-04-15 (×20): 6 [IU] via SUBCUTANEOUS

## 2021-04-08 NOTE — Progress Notes (Signed)
3 Days Post-Op Procedure(s) (LRB): CORONARY ARTERY BYPASS GRAFTING (CABG) X 4, USING LEFT INTERNAL MAMMARY ARTERY, LEFT RADIAL ARTERY, AND RIGHT LEG GREATER SAPHENOUS VEIN HARVESTED ENDOSCOPICALLY (N/A) RADIAL ARTERY HARVEST (Left) TRANSESOPHAGEAL ECHOCARDIOGRAM (TEE) (N/A) Subjective: Complains of some chest wall pain with coughing or moving. Has only been taking Tylenol because he says narcotics make him too sleepy.   Objective: Vital signs in last 24 hours: Temp:  [97.5 F (36.4 C)-98.3 F (36.8 C)] 98.3 F (36.8 C) (08/14 1100) Pulse Rate:  [66-88] 77 (08/14 1200) Cardiac Rhythm: Atrial fibrillation (08/14 0800) Resp:  [10-20] 13 (08/14 1200) BP: (113-155)/(69-92) 129/75 (08/14 1200) SpO2:  [89 %-100 %] 95 % (08/14 1200) Weight:  [172.5 kg] 172.5 kg (08/14 0500)  Hemodynamic parameters for last 24 hours:    Intake/Output from previous day: 08/13 0701 - 08/14 0700 In: 1418.6 [P.O.:1000; I.V.:418.6] Out: 735 [Urine:675; Chest Tube:60] Intake/Output this shift: Total I/O In: -  Out: 300 [Urine:300]  General appearance: alert and cooperative Neurologic: intact Heart: irregularly irregular rhythm Lungs: clear to auscultation bilaterally Extremities: edema mild Wound: incision ok  Lab Results: Recent Labs    04/07/21 0310 04/08/21 0145  WBC 18.2* 16.5*  HGB 10.9* 11.2*  HCT 33.3* 33.3*  PLT 167 189   BMET:  Recent Labs    04/07/21 0310 04/08/21 0145  NA 136 134*  K 4.2 4.3  CL 104 104  CO2 24 23  GLUCOSE 164* 220*  BUN 25* 36*  CREATININE 1.63* 1.75*  CALCIUM 8.3* 8.6*    PT/INR:  Recent Labs    04/05/21 1607  LABPROT 17.5*  INR 1.4*   ABG    Component Value Date/Time   PHART 7.370 04/06/2021 0002   HCO3 19.0 (L) 04/06/2021 0002   TCO2 20 (L) 04/06/2021 0002   ACIDBASEDEF 6.0 (H) 04/06/2021 0002   O2SAT 94.0 04/06/2021 0002   CBG (last 3)  Recent Labs    04/08/21 0336 04/08/21 0746 04/08/21 1136  GLUCAP 198* 230* 181*     Assessment/Plan: S/P Procedure(s) (LRB): CORONARY ARTERY BYPASS GRAFTING (CABG) X 4, USING LEFT INTERNAL MAMMARY ARTERY, LEFT RADIAL ARTERY, AND RIGHT LEG GREATER SAPHENOUS VEIN HARVESTED ENDOSCOPICALLY (N/A) RADIAL ARTERY HARVEST (Left) TRANSESOPHAGEAL ECHOCARDIOGRAM (TEE) (N/A)  POD 3  Hemodynamically stable   Postop atrial fibrillation with controlled rate 70's on IV amio started last night. Continue IV today.   Postop creatinine elevation with baseline of 1.2 preop. Slightly higher to 1.75 today. Will hold off on diuresis and observe.   Volume excess: his weight is about at preop although he does have some edema.  DM: glucose still 180-200. Will increase Levemir and add Novolog meal coverage in addition to SSI.  IS, OOB, ambulate.  LOS: 6 days    Alleen Borne 04/08/2021

## 2021-04-08 NOTE — Progress Notes (Signed)
Patient ID: Kevin Lawrence, male   DOB: 05/25/1966, 56 y.o.   MRN: [id number] TCTS Evening Rounds:  Hypertensive 150-160's. Remains in atrial fib with rate 90's. Will increase Lopressor to 50 bid and continue IV amio.  Up in chair.  Urine output good without diuretic.

## 2021-04-09 LAB — BASIC METABOLIC PANEL
Anion gap: 8 (ref 5–15)
BUN: 27 mg/dL — ABNORMAL HIGH (ref 6–20)
CO2: 24 mmol/L (ref 22–32)
Calcium: 8.2 mg/dL — ABNORMAL LOW (ref 8.9–10.3)
Chloride: 103 mmol/L (ref 98–111)
Creatinine, Ser: 1.25 mg/dL — ABNORMAL HIGH (ref 0.61–1.24)
GFR, Estimated: >60 mL/min (ref >60)
Glucose, Bld: 188 mg/dL — ABNORMAL HIGH (ref 70–99)
Potassium: 3.5 mmol/L (ref 3.5–5.1)
Sodium: 135 mmol/L (ref 135–145)

## 2021-04-09 LAB — GLUCOSE, CAPILLARY
Glucose-Capillary: 180 mg/dL — ABNORMAL HIGH (ref 70–99)
Glucose-Capillary: 186 mg/dL — ABNORMAL HIGH (ref 70–99)
Glucose-Capillary: 201 mg/dL — ABNORMAL HIGH (ref 70–99)
Glucose-Capillary: 305 mg/dL — ABNORMAL HIGH (ref 70–99)

## 2021-04-09 LAB — CBC
HCT: 33.6 % — ABNORMAL LOW (ref 39.0–52.0)
Hemoglobin: 11.5 g/dL — ABNORMAL LOW (ref 13.0–17.0)
MCH: 31.4 pg (ref 26.0–34.0)
MCHC: 34.2 g/dL (ref 30.0–36.0)
MCV: 91.8 fL (ref 80.0–100.0)
Platelets: 256 10*3/uL (ref 150–400)
RBC: 3.66 MIL/uL — ABNORMAL LOW (ref 4.22–5.81)
RDW: 14.4 % (ref 11.5–15.5)
WBC: 13.6 10*3/uL — ABNORMAL HIGH (ref 4.0–10.5)
nRBC: 0 % (ref 0.0–0.2)

## 2021-04-09 LAB — SARS CORONAVIRUS 2 (TAT 6-24 HRS): SARS Coronavirus 2: NEGATIVE

## 2021-04-09 MED ORDER — FUROSEMIDE 10 MG/ML IJ SOLN
40.0000 mg | Freq: Once | INTRAMUSCULAR | Status: AC
  Administered 2021-04-09: 40 mg via INTRAVENOUS
  Filled 2021-04-09: qty 4

## 2021-04-09 MED ORDER — SODIUM CHLORIDE 0.9% FLUSH: 10.0000 mL | INTRAVENOUS | Status: DC | PRN

## 2021-04-09 MED ORDER — ~~LOC~~ CARDIAC SURGERY, PATIENT & FAMILY EDUCATION: Freq: Once | Status: DC

## 2021-04-09 MED ORDER — SODIUM CHLORIDE 0.9% FLUSH
10.0000 mL | Freq: Two times a day (BID) | INTRAVENOUS | Status: DC
Start: 2021-04-09 — End: 2021-04-11
  Administered 2021-04-09 (×2): 10 mL

## 2021-04-09 MED ORDER — AMIODARONE LOAD VIA INFUSION
150.0000 mg | Freq: Once | INTRAVENOUS | Status: AC
  Administered 2021-04-09: 150 mg via INTRAVENOUS
  Filled 2021-04-09: qty 83.34

## 2021-04-09 MED ORDER — SODIUM CHLORIDE 0.9 % IV SOLN: 250.0000 mL | INTRAVENOUS | Status: DC | PRN

## 2021-04-09 MED ORDER — SODIUM CHLORIDE 0.9% FLUSH: 3.0000 mL | INTRAVENOUS | Status: DC | PRN

## 2021-04-09 MED ORDER — SODIUM CHLORIDE 0.9% FLUSH
3.0000 mL | Freq: Two times a day (BID) | INTRAVENOUS | Status: DC
  Administered 2021-04-09 – 2021-04-15 (×14): 3 mL via INTRAVENOUS

## 2021-04-09 MED ORDER — AMIODARONE IV BOLUS ONLY 150 MG/100ML: 150.0000 mg | Freq: Once | INTRAVENOUS | Status: DC

## 2021-04-09 MED ORDER — POTASSIUM CHLORIDE CRYS ER 20 MEQ PO TBCR
20.0000 meq | EXTENDED_RELEASE_TABLET | ORAL | Status: AC
  Administered 2021-04-09 (×3): 20 meq via ORAL
  Filled 2021-04-09 (×3): qty 1

## 2021-04-09 NOTE — Progress Notes (Signed)
CARDIAC REHAB PHASE I   PRE:  Rate/Rhythm: 81 afib  BP:  Supine:   Sitting: 119/80  Standing:    SaO2: 97%1L  MODE:  Ambulation: 290 ft   POST:  Rate/Rhythm: 93 afib/flutter  BP:  Supine:   Sitting: 164/86  Standing:    SaO2: 98% 1L 1330-1410 Pt walked 290 ft on 1L with EVA and asst x 1. Stopped once to rest. To recliner after walk. Sats good on 1L. Will try RA next walk. Pt stood independently without use of arms adhering to sternal precautions.c/o wound vac being uncomfortable. Notified RN.   Luetta Nutting, RN BSN  04/09/2021 2:05 PM

## 2021-04-09 NOTE — Progress Notes (Signed)
      [address]       [place] [address]             [phone]                 4 Days Post-Op Procedure(s) (LRB): CORONARY ARTERY BYPASS GRAFTING (CABG) X 4, USING LEFT INTERNAL MAMMARY ARTERY, LEFT RADIAL ARTERY, AND RIGHT LEG GREATER SAPHENOUS VEIN HARVESTED ENDOSCOPICALLY (N/A) RADIAL ARTERY HARVEST (Left) TRANSESOPHAGEAL ECHOCARDIOGRAM (TEE) (N/A)   Events: Remains in afib _______________________________________________________________ Vitals: BP (!) 138/94   Pulse 88   Temp 97.6 F (36.4 C) (Axillary)   Resp 20   Ht 6\' 3"  (1.905 m)   Wt (!) 172.8 kg   SpO2 96%   BMI 47.62 kg/m   - Neuro: Alert NAD  - Cardiovascular: afib, rate controlled  Drips:     - Pulm: Easy work of breathing.     ABG    Component Value Date/Time   PHART 7.370 04/06/2021 0002   PCO2ART 32.7 04/06/2021 0002   PO2ART 72 (L) 04/06/2021 0002   HCO3 19.0 (L) 04/06/2021 0002   TCO2 20 (L) 04/06/2021 0002   ACIDBASEDEF 6.0 (H) 04/06/2021 0002   O2SAT 94.0 04/06/2021 0002    - Abd: Nondistended - Extremity: Warm  .Intake/Output      08/14 0701 08/15 0700 08/15 0701 08/16 0700   P.O.     I.V. (mL/kg) 399.3 (2.3)    Total Intake(mL/kg) 399.3 (2.3)    Urine (mL/kg/hr) 1600 (0.4)    Stool 0    Chest Tube     Total Output 1600    Net -1200.7         Stool Occurrence 2 x       _______________________________________________________________ Labs: CBC Latest Ref Rng & Units 04/09/2021 04/08/2021 04/07/2021  WBC 4.0 - 10.5 K/uL 13.6(H) 16.5(H) 18.2(H)  Hemoglobin 13.0 - 17.0 g/dL 11.5(L) 11.2(L) 10.9(L)  Hematocrit 39.0 - 52.0 % 33.6(L) 33.3(L) 33.3(L)  Platelets 150 - 400 K/uL 256 189 167   CMP Latest Ref Rng & Units 04/09/2021 04/08/2021 04/07/2021  Glucose 70 - 99 mg/dL 04/09/2021) 073(X) 106(Y)  BUN 6 - 20 mg/dL 694(W) 54(O) 27(O)  Creatinine 0.61 - 1.24 mg/dL 35(K) 0.93(G) 1.82(X)  Sodium 135 - 145 mmol/L 135 134(L) 136  Potassium 3.5 - 5.1 mmol/L 3.5 4.3 4.2   Chloride 98 - 111 mmol/L 103 104 104  CO2 22 - 32 mmol/L 24 23 24   Calcium 8.9 - 10.3 mg/dL 8.2(L) 8.6(L) 8.3(L)  Total Protein 6.5 - 8.1 g/dL - - -  Total Bilirubin 0.3 - 1.2 mg/dL - - -  Alkaline Phos 38 - 126 U/L - - -  AST 15 - 41 U/L - - -  ALT 0 - 44 U/L - - -    CXR: -  _______________________________________________________________  Assessment and Plan: POD 4 s/p CABG x4  Neuro: Pain controlled CV: remains in afib.  Will give another bolus  On aspirin/statin/ beta-blocker.  On plavix.  If he remains in afib, will start eliquis Pulm: Continue pulmonary toilet.   Renal: continue diuresis GI: on diet Heme: Stable ID: Afebrile Endo: Sliding scale insulin Dispo: will transfer to floor    Kevin Lawrence O Kevin Lawrence 04/09/2021 8:01 AM

## 2021-04-09 NOTE — Plan of Care (Signed)
  Problem: Education: Goal: Knowledge of General Education information will improve Description: Including pain rating scale, medication(s)/side effects and non-pharmacologic comfort measures Outcome: Progressing   Problem: Health Behavior/Discharge Planning: Goal: Ability to manage health-related needs will improve Outcome: Progressing   Problem: Education: Goal: Understanding of CV disease, CV risk reduction, and recovery process will improve Outcome: Progressing Goal: Individualized Educational Video(s) Outcome: Progressing   Problem: Activity: Goal: Ability to return to baseline activity level will improve Outcome: Progressing   Problem: Cardiovascular: Goal: Ability to achieve and maintain adequate cardiovascular perfusion will improve Outcome: Progressing Goal: Vascular access site(s) Level 0-1 will be maintained Outcome: Progressing   Problem: Education: Goal: Will demonstrate proper wound care and an understanding of methods to prevent future damage Outcome: Progressing Goal: Knowledge of disease or condition will improve Outcome: Progressing Goal: Knowledge of the prescribed therapeutic regimen will improve Outcome: Progressing Goal: Individualized Educational Video(s) Outcome: Progressing   Problem: Health Behavior/Discharge Planning: Goal: Ability to safely manage health-related needs after discharge will improve Outcome: Progressing

## 2021-04-10 LAB — CBC
HCT: 33 % — ABNORMAL LOW (ref 39.0–52.0)
Hemoglobin: 11.3 g/dL — ABNORMAL LOW (ref 13.0–17.0)
MCH: 31.2 pg (ref 26.0–34.0)
MCHC: 34.2 g/dL (ref 30.0–36.0)
MCV: 91.2 fL (ref 80.0–100.0)
Platelets: 288 10*3/uL (ref 150–400)
RBC: 3.62 MIL/uL — ABNORMAL LOW (ref 4.22–5.81)
RDW: 14.3 % (ref 11.5–15.5)
WBC: 13.1 10*3/uL — ABNORMAL HIGH (ref 4.0–10.5)
nRBC: 0 % (ref 0.0–0.2)

## 2021-04-10 LAB — GLUCOSE, CAPILLARY
Glucose-Capillary: 243 mg/dL — ABNORMAL HIGH (ref 70–99)
Glucose-Capillary: 214 mg/dL — ABNORMAL HIGH (ref 70–99)
Glucose-Capillary: 232 mg/dL — ABNORMAL HIGH (ref 70–99)
Glucose-Capillary: 200 mg/dL — ABNORMAL HIGH (ref 70–99)
Glucose-Capillary: 174 mg/dL — ABNORMAL HIGH (ref 70–99)

## 2021-04-10 LAB — BASIC METABOLIC PANEL
Anion gap: 9 (ref 5–15)
BUN: 29 mg/dL — ABNORMAL HIGH (ref 6–20)
CO2: 23 mmol/L (ref 22–32)
Calcium: 8.4 mg/dL — ABNORMAL LOW (ref 8.9–10.3)
Chloride: 102 mmol/L (ref 98–111)
Creatinine, Ser: 1.4 mg/dL — ABNORMAL HIGH (ref 0.61–1.24)
GFR, Estimated: 59 mL/min — ABNORMAL LOW (ref >60)
Glucose, Bld: 227 mg/dL — ABNORMAL HIGH (ref 70–99)
Potassium: 3.7 mmol/L (ref 3.5–5.1)
Sodium: 134 mmol/L — ABNORMAL LOW (ref 135–145)

## 2021-04-10 LAB — ECHO INTRAOPERATIVE TEE
Height: 75 in
Weight: 5883.2 oz

## 2021-04-10 MED ORDER — AMIODARONE HCL 200 MG PO TABS
400.0000 mg | ORAL_TABLET | Freq: Every day | ORAL | Status: DC
  Administered 2021-04-10 – 2021-04-16 (×7): 400 mg via ORAL
  Filled 2021-04-10 (×7): qty 2

## 2021-04-10 MED ORDER — FUROSEMIDE 40 MG PO TABS
40.0000 mg | ORAL_TABLET | Freq: Every day | ORAL | Status: DC
  Administered 2021-04-10 – 2021-04-11 (×2): 40 mg via ORAL
  Filled 2021-04-10 (×2): qty 1

## 2021-04-10 MED ORDER — POTASSIUM CHLORIDE CRYS ER 20 MEQ PO TBCR
20.0000 meq | EXTENDED_RELEASE_TABLET | ORAL | Status: AC
  Administered 2021-04-10 (×3): 20 meq via ORAL
  Filled 2021-04-10 (×3): qty 1

## 2021-04-10 MED ORDER — INSULIN DETEMIR 100 UNIT/ML ~~LOC~~ SOLN
60.0000 [IU] | Freq: Every day | SUBCUTANEOUS | Status: DC
  Administered 2021-04-10 – 2021-04-11 (×2): 60 [IU] via SUBCUTANEOUS
  Filled 2021-04-10 (×4): qty 0.6

## 2021-04-10 MED ORDER — AMLODIPINE BESYLATE 10 MG PO TABS
10.0000 mg | ORAL_TABLET | Freq: Every day | ORAL | Status: DC
  Administered 2021-04-10 – 2021-04-16 (×7): 10 mg via ORAL
  Filled 2021-04-10 (×7): qty 1

## 2021-04-10 NOTE — Progress Notes (Signed)
CARDIAC REHAB PHASE I   PRE:  Rate/Rhythm: 84 SR  BP:  Supine:   Sitting: 130/71  Standing:    SaO2: 96%RA 1243-1253 Offered to walk with pt. Pt declined at this time and asked that I come back later. Discussed with pt that I would return if time permitted but I may not be able to get back. Pt stated he will ask staff to walk with him. Encouraged goal of three walks a day now that pt has transfer order. Encouraged IS 10X an hour. Will continue to follow. Luetta Nutting RN BSN 04/10/2021 12:53 PM    Luetta Nutting, RN BSN  04/10/2021 12:50 PM

## 2021-04-10 NOTE — Progress Notes (Addendum)
Pt arrived from 2H26. Telemetry applied, VSS, oriented to white board and unit.Pt has undocumented sternal wound vac (provena on regular machine). Attempted to document. Pt resting with call bell within reach.  Will continue to monitor.

## 2021-04-10 NOTE — Progress Notes (Signed)
      [address]       [place] [address]             [phone]                 5 Days Post-Op Procedure(s) (LRB): CORONARY ARTERY BYPASS GRAFTING (CABG) X 4, USING LEFT INTERNAL MAMMARY ARTERY, LEFT RADIAL ARTERY, AND RIGHT LEG GREATER SAPHENOUS VEIN HARVESTED ENDOSCOPICALLY (N/A) RADIAL ARTERY HARVEST (Left) TRANSESOPHAGEAL ECHOCARDIOGRAM (TEE) (N/A)   Events: Back in sinus _______________________________________________________________ Vitals: BP 130/71   Pulse 83   Temp 97.9 F (36.6 C) (Oral)   Resp 18   Ht 6\' 3"  (1.905 m)   Wt (!) 172.4 kg   SpO2 96%   BMI 47.51 kg/m   - Neuro: Alert NAD  - Cardiovascular: Sinus  drips:     - Pulm: Easy work of breathing.     ABG    Component Value Date/Time   PHART 7.370 04/06/2021 0002   PCO2ART 32.7 04/06/2021 0002   PO2ART 72 (L) 04/06/2021 0002   HCO3 19.0 (L) 04/06/2021 0002   TCO2 20 (L) 04/06/2021 0002   ACIDBASEDEF 6.0 (H) 04/06/2021 0002   O2SAT 94.0 04/06/2021 0002    - Abd: Nondistended - Extremity: Warm  .Intake/Output      08/15 0701 08/16 0700 08/16 0701 08/17 0700   P.O. 100    I.V. (mL/kg) 363.4 (2.1) 159.1 (0.9)   Total Intake(mL/kg) 463.4 (2.7) 159.1 (0.9)   Urine (mL/kg/hr) 1400 (0.3)    Stool 0    Total Output 1400    Net -936.6 +159.1        Urine Occurrence 1 x    Stool Occurrence 1 x       _______________________________________________________________ Labs: CBC Latest Ref Rng & Units 04/10/2021 04/09/2021 04/08/2021  WBC 4.0 - 10.5 K/uL 13.1(H) 13.6(H) 16.5(H)  Hemoglobin 13.0 - 17.0 g/dL 11.3(L) 11.5(L) 11.2(L)  Hematocrit 39.0 - 52.0 % 33.0(L) 33.6(L) 33.3(L)  Platelets 150 - 400 K/uL 288 256 189   CMP Latest Ref Rng & Units 04/10/2021 04/09/2021 04/08/2021  Glucose 70 - 99 mg/dL 04/10/2021) 938(B) 017(P)  BUN 6 - 20 mg/dL 102(H) 85(I) 77(O)  Creatinine 0.61 - 1.24 mg/dL 24(M) 3.53(I) 1.44(R)  Sodium 135 - 145 mmol/L 134(L) 135 134(L)  Potassium 3.5 - 5.1  mmol/L 3.7 3.5 4.3  Chloride 98 - 111 mmol/L 102 103 104  CO2 22 - 32 mmol/L 23 24 23   Calcium 8.9 - 10.3 mg/dL 1.54(M) ) 0.8(Q)  Total Protein 6.5 - 8.1 g/dL - - -  Total Bilirubin 0.3 - 1.2 mg/dL - - -  Alkaline Phos 38 - 126 U/L - - -  AST 15 - 41 U/L - - -  ALT 0 - 44 U/L - - -    CXR: -  _______________________________________________________________  Assessment and Plan: POD 5 s/p CABG x4  Neuro: Pain controlled CV: Back in sinus rhythm.  If he goes back in A. fib, then we will stop the Plavix and start him on Eliquis. Pulm: Continue pulmonary toilet.   Renal: continue diuresis GI: on diet Heme: Stable ID: Afebrile Endo: Sliding scale insulin Dispo: Awaiting floor bed   Marquisa Salih O Adriene Knipfer 04/10/2021 3:12 PM

## 2021-04-11 ENCOUNTER — Other Ambulatory Visit (HOSPITAL_COMMUNITY): Payer: Self-pay

## 2021-04-11 ENCOUNTER — Inpatient Hospital Stay (HOSPITAL_COMMUNITY): Payer: 59

## 2021-04-11 LAB — BASIC METABOLIC PANEL
Anion gap: 9 (ref 5–15)
BUN: 19 mg/dL (ref 6–20)
CO2: 22 mmol/L (ref 22–32)
Calcium: 8.4 mg/dL — ABNORMAL LOW (ref 8.9–10.3)
Chloride: 104 mmol/L (ref 98–111)
Creatinine, Ser: 1.13 mg/dL (ref 0.61–1.24)
GFR, Estimated: >60 mL/min (ref >60)
Glucose, Bld: 210 mg/dL — ABNORMAL HIGH (ref 70–99)
Potassium: 4.1 mmol/L (ref 3.5–5.1)
Sodium: 135 mmol/L (ref 135–145)

## 2021-04-11 LAB — GLUCOSE, CAPILLARY
Glucose-Capillary: 167 mg/dL — ABNORMAL HIGH (ref 70–99)
Glucose-Capillary: 217 mg/dL — ABNORMAL HIGH (ref 70–99)
Glucose-Capillary: 275 mg/dL — ABNORMAL HIGH (ref 70–99)
Glucose-Capillary: 225 mg/dL — ABNORMAL HIGH (ref 70–99)

## 2021-04-11 MED ORDER — ACETAMINOPHEN 325 MG PO TABS
650.0000 mg | ORAL_TABLET | Freq: Four times a day (QID) | ORAL | Status: DC | PRN
  Administered 2021-04-11 – 2021-04-15 (×10): 650 mg via ORAL
  Filled 2021-04-11 (×10): qty 2

## 2021-04-11 MED ORDER — APIXABAN 5 MG PO TABS
5.0000 mg | ORAL_TABLET | Freq: Two times a day (BID) | ORAL | Status: DC
  Administered 2021-04-11 – 2021-04-16 (×11): 5 mg via ORAL
  Filled 2021-04-11 (×11): qty 1

## 2021-04-11 NOTE — Progress Notes (Addendum)
[address]       [place] [address]             [phone]        6 Days Post-Op Procedure(s) (LRB): CORONARY ARTERY BYPASS GRAFTING (CABG) X 4, USING LEFT INTERNAL MAMMARY ARTERY, LEFT RADIAL ARTERY, AND RIGHT LEG GREATER SAPHENOUS VEIN HARVESTED ENDOSCOPICALLY (N/A) RADIAL ARTERY HARVEST (Left) TRANSESOPHAGEAL ECHOCARDIOGRAM (TEE) (N/A)  Subjective: Patient with some loose stools.  Objective: Vital signs in last 24 hours: Temp:  [97.8 F (36.6 C)-98.6 F (37 C)] 98.2 F (36.8 C) (08/17 0357) Pulse Rate:  [81-87] 84 (08/17 0357) Cardiac Rhythm: Atrial fibrillation (08/17 0200) Resp:  [14-22] 19 (08/17 0357) BP: (125-166)/(65-88) 125/65 (08/17 0357) SpO2:  [94 %-98 %] 97 % (08/17 0357) Weight:  [174.7 kg] 174.7 kg (08/17 0200)  Pre op weight  166.8 (?) kg Current Weight  04/11/21 (!) 174.7 kg       Intake/Output from previous day: 08/16 0701 - 08/17 0700 In: 279.1 [P.O.:120; I.V.:159.1] Out: 1500 [Urine:1500]   Physical Exam:  Cardiovascular: IRRR IRRR Pulmonary: Slightly diminished bibasilar breath sounds Abdomen: Soft, non tender, obese, bowel sounds present. Extremities: ++ bilateral lower extremity edema. Wounds: Prevena wound vac in place on sternal wound. LUEs and RLE wounds are clean and dry.  No erythema or signs of infection.  Lab Results: CBC: Recent Labs    04/09/21 0614 04/10/21 0046  WBC 13.6* 13.1*  HGB 11.5* 11.3*  HCT 33.6* 33.0*  PLT 256 288   BMET:  Recent Labs    04/09/21 0614 04/10/21 0046  NA 135 134*  K 3.5 3.7  CL 103 102  CO2 24 23  GLUCOSE 188* 227*  BUN 27* 29*  CREATININE 1.25* 1.40*  CALCIUM 8.2* 8.4*    PT/INR:  Lab Results  Component Value Date   INR 1.4 (H) 04/05/2021   INR 1.0 04/04/2021   INR 1.0 10/01/2017   ABG:  INR: Will add last result for INR, ABG once components are confirmed Will add last 4 CBG results once components are confirmed  Assessment/Plan:  1. CV -  Previous a fib. On Amiodarone 400 mg bid, Lopressor 50 mg bid, Plavix 75 mg daily, and Amlodipine 10 mg daily for radial artery harvest. 2.  Pulmonary - On room air. Check PA/LAT CXR .Encourage incentive spirometer 3. Volume Overload - On Lasix 40 mg daily 4.  Expected post op acute blood loss anemia - H and H yesterday stable at 11.3 and 33 5. DM-CBGs 200/174/167. On Acotplus Met and Insulin prior to surgery. Will restart Actoplus at discharge as long as creatinine improved. Pre op HGA1C 6.5 6. Creatinine 08/16 was up to 1.4. Will recheck today 7. Stop stool softeners 8. Patient lives alone. Will discuss disposition with Dr.Mauri Temkin  Kevin M ZimmermanPA-C 04/11/2021,7:41 AM  Remains in afib Starting eliquis, and d/c plavix Kevin Lawrence

## 2021-04-11 NOTE — TOC Initial Note (Addendum)
Transition of Care (TOC) - Initial/Assessment Note  Donn Pierini RN, BSN Transitions of Care Unit 4E- RN Case Manager See Treatment Team for direct phone #    Patient Details  Name: Kevin Lawrence MRN: [id number] Date of Birth: 15-Sep-1965  Transition of Care [facility]) CM/SW Contact:    Darrold Span, RN Phone Number: 04/11/2021, 4:04 PM  Clinical Narrative:                 Pt s/p CABG from home alone, noted order for [facility] placed.  CM in to speak with pt at bedside regarding transition plans. Per conversation pt states he is working on transition plan and assistance/supervision for home as he states he has been told he needs someone with him 24/7 for first 2 weeks. He has family but they work, he may be able to stay with family, but has not confirmed this yet.  Discussed HHRN- list provided Per CMS guidelines from medicare.gov website with star ratings (copy placed in shadow chart) - pt voiced he thought RN would come daily- informed pt that [facility] would not be a daily visit more than likely a weekly visit. Pt voiced that he is not sure he would need that type of [facility] visit- he will think about it at this time. Pt also asked about rehab however insurance likely to not cover/deny a rehab stay. Pt voiced understanding.   Discussed [facility] cost- pt agreeable to $30 copay.   Also spoke about potential DME needs- pt would like a RW for home- will ask for DME order prior to discharge and pt agreeable to use in house provider to have delivered to room.   TOC to continue to follow for transition needs.   Expected Discharge Plan: Home w Home Health Services Barriers to Discharge: Continued Medical Work up   Patient Goals and CMS Choice Patient states their goals for this hospitalization and ongoing recovery are:: return home CMS Medicare.gov Compare Post Acute Care list provided to:: Patient Choice offered to / list presented to : Patient  Expected Discharge Plan and Services Expected  Discharge Plan: Home w Home Health Services   Discharge Planning Services: CM Consult Post Acute Care Choice: Durable Medical Equipment, Home Health Living arrangements for the past 2 months: Single Family Home                 DME Arranged: Walker rolling DME Agency: [employer]       HH Arranged: RN, Patient Refused HH          Prior Living Arrangements/Services Living arrangements for the past 2 months: Single Family Home Lives with:: Self Patient language and need for interpreter reviewed:: Yes Do you feel safe going back to the place where you live?: Yes      Need for Family Participation in Patient Care: Yes (Comment) Care giver support system in place?:  (Pt working on it)   Criminal Activity/Legal Involvement Pertinent to Current Situation/Hospitalization: No - Comment as needed  Activities of Daily Living Home Assistive Devices/Equipment: CBG Meter, CPAP, Eyeglasses ADL Screening (condition at time of admission) Patient's cognitive ability adequate to safely complete daily activities?: Yes Is the patient deaf or have difficulty hearing?: No Does the patient have difficulty seeing, even when wearing glasses/contacts?: No Does the patient have difficulty concentrating, remembering, or making decisions?: No Patient able to express need for assistance with ADLs?: Yes Does the patient have difficulty dressing or bathing?: No Independently performs ADLs?: Yes (appropriate for developmental age) Does  the patient have difficulty walking or climbing stairs?: No Weakness of Legs: None Weakness of Arms/Hands: None  Permission Sought/Granted Permission sought to share information with : Facility Industrial/product designer granted to share information with : Yes, Verbal Permission Granted     Permission granted to share info w AGENCY: DME        Emotional Assessment Appearance:: Appears stated age Attitude/Demeanor/Rapport: Engaged Affect (typically observed):  Appropriate, Accepting, Pleasant Orientation: : Oriented to Self, Oriented to Place, Oriented to  Time, Oriented to Situation Alcohol / Substance Use: Not Applicable Psych Involvement: No (comment)  Admission diagnosis:  Chest pain [R07.9] Acute coronary syndrome (HCC) [I24.9] S/P CABG x 4 [Z95.1] Patient Active Problem List   Diagnosis Date Noted   S/P CABG x 4 04/05/2021   Acute coronary syndrome (HCC) 04/02/2021   Chest pain 04/02/2021   Foot pain, bilateral 09/29/2020   Plantar callus 05/13/2019   Ingrown nail 07/28/2018   Dizzy spells    SOB (shortness of breath) 10/01/2017   Contusion of right heel 06/09/2017   Nondisplaced fracture of body of right calcaneus, initial encounter for closed fracture 06/09/2017   Right foot pain 06/09/2017   OSA on CPAP 02/24/2017   SVT (supraventricular tachycardia) (HCC) 02/21/2017   Injury of toenail of left foot 06/21/2016   Loose toenail 06/21/2016   Capsulitis of metatarsophalangeal (MTP) joint of right foot 06/11/2016   Uncontrolled type 2 diabetes mellitus without complication, with long-term current use of insulin 06/11/2016   Diabetes mellitus due to underlying condition with unspecified complications (HCC) 06/19/2015   Coronary artery disease involving native coronary artery of native heart with unstable angina pectoris (HCC) 03/10/2015   Hyperlipidemia associated with type 2 diabetes mellitus (HCC) 03/10/2015   Essential hypertension 03/10/2015   Morbid obesity with BMI of 45.0-49.9, adult (HCC) 03/10/2015   PCP:  Wilmer Floor., MD Pharmacy:   [phone] - [place], [place] - [address] [place] [address] Phone: [phone] Fax: [phone]  [facility] [address] [place] [address] Phone: [phone] Fax: [phone]     Social Determinants of Health (SDOH) Interventions    Readmission Risk Interventions No flowsheet data found.

## 2021-04-11 NOTE — Discharge Summary (Signed)
Physician Discharge Summary       [address]       [place] [address]             [phone]    Patient ID: Kevin Lawrence MRN: [id number] DOB/AGE: June 19, 1966 55 y.o.  Admit date: 04/02/2021 Discharge date: 04/16/2021  Admission Diagnoses:  Acute coronary syndrome ([place]) 2. S/p NSTEMI 3. Coronary artery disease  Discharge Diagnoses:  S/P CABG x 4 2. Expected post op blood loss anemia 3. Post op paroxysmal atrial fibrillation 4. History of Diabetes mellitus due to underlying condition with unspecified complications ([place]) 5. History of Hyperlipidemia  6. History of morbid obesity 7. History of Essential hypertension 8. History of OSA on CPAP 9. History of SVT (supraventricular tachycardia) (HCC) 10. History of Plantar callus 11. History of foot pain, bilateral 12. History of capsulitis of metatarsophalangeal (MTP) joint of right foot 13. History of ingrown toenail   Consults: None  Procedure (s):  CABG X 4, LIMA LAD, reverse saphenous vein graft to PDA and first diagonal.  Left radial artery to the obtuse marginal Endoscopic greater saphenous vein harvest on the right Open left radial artery harvest Sternal closure with KLS plating by Dr. Kipp Brood on 04/05/2021.  History of Presenting Illness: Kevin Lawrence is a 55 year old history of CAD s/p multiple overlapping stents of the RCA and stenting to OM1 with total occlusion of LCX on cardiac catheterization in 2019, paroxysmal SVT, hypetension, dyslipidemia, uncontrolled type 2 diabetes mellitus, morbid obesity, and a remote history of smoking quitting about 20 years ago who presented to [facility] for chest pain. He was having atypical chest pain which occurred at rest which felt like "someone was sticking their finger in his left chest". The episodes were brief but kept reoccurring which is why the patient came to the ED. Troponin was mildly elevated. Dr. [name] saw him and recommended transfer to [facility] for cardiac cath. Cardiac cath showed 50% stenosis of the LAD followed by proximal LAD lesion of 70%. His first LPL is 100% stenosed, proximal circumflex to mid circumflex is 95% just prior to the stent, proximal RCA lesion is 90% stenosed just prior to the long stented segment,  and the proximal RCA 2 stent is 20% stenosed followed by mid RCA lesion of 70% stenosis. LVEF is estimated to be 50-55%. Cardiology consulted [place] for possible surgical revascularization.    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 He lives at home by himself with his 2 cats. He also has a strong family history on his father's side of coronary disease and his sister died after CABG. He had previously had 11 stents placed. For this reason he is nervous about undergoing CABG. Potential risks, benefits, and complications of the surgery were discussed with the patient and he agreed to proceed with surgery. He underwent a CABG x 4 and sternal closure with KLS plating on 04/05/2021.  Brief Hospital Course:  On 04/05/2021 He underwent a CABG x 4 with radial artery and right leg greater saphenous vein harvest endoscopically with Dr. Kipp Brood. He tolerated the procedure well and was transferred to the surgical ICU for continued care. He was extubated in a timely manner. He was placed on bi pap initially but later weaned to [facility]. He was weaned off  Cardene drip. Gordy Councilman, a line, chest tubes, and foley were all removed early in his post operative course. His creatinine was elevated post op. It went up to 1.75. Over time, creatinine did decreased to ** on 08/17. He was started on Lopressor and this was titrated accordingly. He was started on low dose Amlodipine for radial artery harvest. He was weaned off the Insulin drip. He has a history of diabetes and his pre op  HGA1C was 6.5. He was on Victoza, Acotplus Met and Toujeo Solostar prior to surgery. All will be resumed at discharge. He was felt surgically stable for transfer from the ICU to 4E for further convalescence on 08/16. He is ambulating on room. He is tolerating a diet and has had bowel movement. Prevena wound VAC was removed from sternum and lower sternal wound has serous drainage, likely from fat necrosis but no surrounding erythema and no leukocytosis or fever. LUE and RLE wounds are clean, dry, and healing without signs of infection. Motor/sensory intact LUE wound. Patient does live alone but does have family and friends who can check on him. He would likely not qualify for a SNF as ambulating fairly well. Patient is felt surgically stable for discharge today.   Latest Vital Signs: Blood pressure 111/75, pulse (!) 33, temperature 98.5 F (36.9 C), temperature source Oral, resp. rate (!) 22, height '6\' 3"'  (1.905 m), weight (!) 165.3 kg, SpO2 96 %.  Physical Exam: General appearance: alert, cooperative, and no distress Heart: irregularly irregular rhythm, monitor showing rate-controlled a-fib.  Lungs: clear to auscultation bilaterally Abdomen: soft, non-tender; bowel sounds normal; no masses,  no organomegaly Extremities: 1-2+ pitting edema. Left hand well perfused, no m/s deficits.  Wound: Sternal dressing changed. No drainage overnight. The left radial artery harvest incision and right LE EVH incision are both intact and dry.     Discharge Condition:Stable and discharged to home.  Recent laboratory studies:  Lab Results  Component Value Date   WBC 11.2 (H) 04/14/2021   HGB 10.8 (L) 04/14/2021   HCT 32.3 (L) 04/14/2021   MCV 91.2 04/14/2021   PLT 438 (H) 04/14/2021   Lab Results  Component Value Date   NA 136 04/16/2021   K 3.2 (L) 04/16/2021   CL 96 (L) 04/16/2021   CO2 31 04/16/2021   CREATININE 1.28 (H) 04/16/2021   GLUCOSE 85 04/16/2021      Diagnostic Studies: DG Chest 2  View  Result Date: 04/11/2021 CLINICAL DATA:  Shortness of breath.  Status post cardiac surgery. EXAM: CHEST - 2 VIEW COMPARISON:  April 07, 2021. FINDINGS: Status post coronary bypass graft. Stable cardiomediastinal silhouette. Mild right basilar subsegmental atelectasis is noted. Small left pleural effusion is noted. No pneumothorax is noted. Bony thorax is unremarkable. IMPRESSION: Mild right basilar subsegmental atelectasis. Small left pleural effusion. Electronically Signed   By: Marijo Conception M.D.   On: 04/11/2021 15:25   DG Chest 2 View  Result Date: 04/04/2021 CLINICAL DATA:  Preop bypass EXAM: CHEST - 2 VIEW COMPARISON:  03/30/2021 FINDINGS: Streaky atelectasis or scarring at the right base. No acute consolidation or effusion. Normal cardiac size. No pneumothorax. IMPRESSION: No active cardiopulmonary disease. Streaky scarring or atelectasis at the right base Electronically Signed   By: Donavan Foil M.D.   On: 04/04/2021 22:47   CARDIAC CATHETERIZATION  Result Date: 04/02/2021 Formatting of this result is different from the original.   Ost LAD lesion is 50% stenosed. followed by Prox LAD lesion is 70% stenosed. -  RFR POSITIVE (0.86)   1st LPL lesion is 100% stenosed. (Known to be occluded - on previous report noted as Mid LCx   Prox Cx to Mid Cx(-2nd Mrg) lesion is 95% stenosed just priot to stent.   Prox RCA-1 lesion is 90% stenosed just priot to long stented segment.   Prox RCA-2 Stent is 20% stenosed, followed by Mid RCA lesion is 70% stenosis   The left ventricular systolic function is normal.  The left ventricular ejection fraction is 50-55% by visual estimate.   LV end diastolic pressure is normal. SUMMARY Severe Three-Vessel CAD: Tandem 50 and 70% focal stenosis in the proximal LAD (RFR positive at 0.86) Mid LCx (2nd Mrg) 95% focal stenosis at the proximal edge of previously placed stent; this is just after the takeoff of the AV groove branch with known occlusion of LPL branch. Proximal  RCA 90% stenosis just prior to stent with 70% ISR in the mid portion of the RCA stent Normal LVEDP, preserved LVEF RECOMMENDATIONS With three-vessel disease 2 of which involve in-stent restenosis/proximal and stenosis as well as now RFR positive LAD disease, recommend CABG. Will admit to inpatient for stabilization and CVTS consultation. Restart IV heparin 8 hours after sheath removal Continue aggressive GM DT medical management for CAD/ACS Glenetta Hew, MD  DG Chest Port 1 View  Result Date: 04/07/2021 CLINICAL DATA:  Pleural effusion. History of coronary artery disease. EXAM: PORTABLE CHEST 1 VIEW COMPARISON:  April 06, 2021 FINDINGS: Stable right IJ. No pneumothorax. No nodules or masses. The cardiomediastinal silhouette is stable. No acute abnormalities. IMPRESSION: No active disease. Electronically Signed   By: Dorise Bullion III M.D.   On: 04/07/2021 13:25   DG Chest Port 1 View  Result Date: 04/06/2021 CLINICAL DATA:  Status post CABG EXAM: PORTABLE CHEST 1 VIEW COMPARISON:  Chest radiograph 1 day prior FINDINGS: The patient has been extubated. The enteric catheter has been removed. A right IJ catheter is in place terminating in the SVC. Median sternotomy wires and mediastinal surgical clips are unchanged. The heart is enlarged, unchanged. The mediastinal contours are otherwise within normal limits. Lung volumes are low. Patchy opacities in the left base likely reflect atelectasis. There is no focal opacity. Vascular congestion appears improved. There is no overt pulmonary edema. There is no significant pleural effusion. There is no appreciable pneumothorax. The bones are unremarkable. IMPRESSION: Low lung volumes with improved vascular congestion. Electronically Signed   By: Valetta Mole MD   On: 04/06/2021 08:52   DG Chest Port 1 View  Result Date: 04/05/2021 CLINICAL DATA:  Status post coronary bypass graft. EXAM: PORTABLE CHEST 1 VIEW COMPARISON:  April 04, 2021. FINDINGS: Stable  cardiomediastinal silhouette. Endotracheal and nasogastric tubes are in grossly good position. Right internal jugular catheter is noted with distal tip in expected position of the SVC. No pneumothorax is noted. Hypoinflation of the lungs is noted with mild bibasilar subsegmental atelectasis. Bony thorax is unremarkable. IMPRESSION: Endotracheal and nasogastric tubes are in grossly good position. Hypoinflation of the lungs is noted with mild bibasilar subsegmental atelectasis. Electronically Signed   By: Marijo Conception M.D.   On: 04/05/2021 16:30   ECHO INTRAOPERATIVE TEE  Result Date: 04/10/2021  *INTRAOPERATIVE TRANSESOPHAGEAL REPORT *  Patient Name:   Kevin Lawrence Date of Exam: 04/05/2021 Medical Rec #:  [id number]      Height:       75.0 in Accession #:    [id number]     Weight:  367.7 lb Date of Birth:  Sep 14, 1965      BSA:          2.84 m Patient Age:    47 years       BP:           134/57 mmHg Patient Gender: M              HR:           77 bpm. Exam Location:  Anesthesiology Transesophogeal exam was perform intraoperatively during surgical procedure. Patient was closely monitored under general anesthesia during the entirety of examination. Indications:     Coronary Artery Disease Sonographer:     Bernadene Person RDCS Performing Phys: [id number] Lucile Crater LIGHTFOOT Diagnosing Phys: Hoy Morn MD Complications: No known complications during this procedure. POST-OP IMPRESSIONS _ Left Ventricle: The left ventricle is unchanged from pre-bypass. _ Right Ventricle: The right ventricle appears unchanged from pre-bypass. _ Aorta: The aorta appears unchanged from pre-bypass. _ Left Atrium: The left atrium appears unchanged from pre-bypass. _ Left Atrial Appendage: The left atrial appendage appears unchanged from pre-bypass. _ Aortic Valve: The aortic valve appears unchanged from pre-bypass. _ Mitral Valve: The mitral valve appears unchanged from pre-bypass. _ Tricuspid Valve: The tricuspid valve appears  unchanged from pre-bypass. _ Pulmonic Valve: The pulmonic valve appears unchanged from pre-bypass. _ Interatrial Septum: The interatrial septum appears unchanged from pre-bypass. _ Interventricular Septum: The interventricular septum appears unchanged from pre-bypass. _ Pericardium: The pericardium appears unchanged from pre-bypass. _ Comments: Post-bypass images reviewed with surgeon. PRE-OP FINDINGS  Left Ventricle: The left ventricle has low normal systolic function, with an ejection fraction of 50-55%. The cavity size was normal. There is mild concentric left ventricular hypertrophy. Right Ventricle: The right ventricle has normal systolic function. The cavity was normal. There is no increase in right ventricular wall thickness. Left Atrium: Left atrial size was normal in size. No left atrial/left atrial appendage thrombus was detected. Left atrial appendage velocity is normal at greater than 40 cm/s. Right Atrium: Right atrial size was normal in size. Interatrial Septum: No atrial level shunt detected by color flow Doppler. Pericardium: There is no evidence of pericardial effusion. Mitral Valve: The mitral valve is normal in structure. Mitral valve regurgitation is not visualized by color flow Doppler. Tricuspid Valve: The tricuspid valve was normal in structure. Tricuspid valve regurgitation was not visualized by color flow Doppler. Aortic Valve: The aortic valve is tricuspid Aortic valve regurgitation was not visualized by color flow Doppler. Pulmonic Valve: The pulmonic valve was normal in structure. Pulmonic valve regurgitation is trivial by color flow Doppler. Aorta: The aortic root, ascending aorta and aortic arch are normal in size and structure. Pulmonary Artery: The pulmonary artery is of normal size.  Hoy Morn MD Electronically signed by Hoy Morn MD Signature Date/Time: 04/10/2021/2:44:47 PM    Final    VAS US DOPPLER PRE CABG  Result Date: 04/03/2021 PREOPERATIVE VASCULAR EVALUATION Patient  Name:  Kevin Lawrence  Date of Exam:   04/03/2021 Medical Rec #: [id number]       Accession #:    [id number] Date of Birth: [birth year]       Patient Gender: M Patient Age:   10 years Exam Location:  [facility] Procedure:      VAS US DOPPLER PRE CABG Referring Phys: HARRELL LIGHTFOOT --------------------------------------------------------------------------------  Indications:      Pre-CABG. Risk Factors:     Hyperlipidemia, Diabetes, coronary artery disease. Limitations:  Patient body habitus, left upper arm blood glucose monitor Comparison Study: No prior studies. Performing Technologist: Carlos Levering RVT  Examination Guidelines: A complete evaluation includes B-mode imaging, spectral Doppler, color Doppler, and power Doppler as needed of all accessible portions of each vessel. Bilateral testing is considered an integral part of a complete examination. Limited examinations for reoccurring indications may be performed as noted.  Right Carotid Findings: +----------+--------+--------+--------+--------+--------+           PSV cm/sEDV cm/sStenosisDescribeComments +----------+--------+--------+--------+--------+--------+ CCA Prox  173     23                      tortuous +----------+--------+--------+--------+--------+--------+ CCA Distal69      18              calcific         +----------+--------+--------+--------+--------+--------+ ICA Prox  80      26              calcific         +----------+--------+--------+--------+--------+--------+ ICA Distal84      31                      tortuous +----------+--------+--------+--------+--------+--------+ ECA       77      11                               +----------+--------+--------+--------+--------+--------+ +----------+--------+-------+--------+------------+           PSV cm/sEDV cmsDescribeArm Pressure +----------+--------+-------+--------+------------+ [id number]                                   +----------+--------+-------+--------+------------+ +---------+--------+--+--------+--+---------+ VertebralPSV cm/s31EDV cm/s11Antegrade +---------+--------+--+--------+--+---------+ Left Carotid Findings: +----------+--------+--------+--------+--------------------------+--------+           PSV cm/sEDV cm/sStenosisDescribe                  Comments +----------+--------+--------+--------+--------------------------+--------+ CCA Prox  112     24              smooth and heterogenous            +----------+--------+--------+--------+--------------------------+--------+ CCA Distal56      14              irregular and heterogenous         +----------+--------+--------+--------+--------------------------+--------+ ICA Prox  43      11              calcific                           +----------+--------+--------+--------+--------------------------+--------+ ICA Distal70      29                                        tortuous +----------+--------+--------+--------+--------------------------+--------+ ECA       64      8                                                  +----------+--------+--------+--------+--------------------------+--------+  +----------+--------+--------+--------+------------+ SubclavianPSV cm/sEDV cm/sDescribeArm Pressure +----------+--------+--------+--------+------------+  102                                  +----------+--------+--------+--------+------------+ +---------+--------+--+--------+--+---------+ VertebralPSV cm/s56EDV cm/s16Antegrade +---------+--------+--+--------+--+---------+  ABI Findings: +--------+------------------+-----+---------+--------+ Right   Rt Pressure (mmHg)IndexWaveform Comment  +--------+------------------+-----+---------+--------+ [id number]                    triphasic         +--------+------------------+-----+---------+--------+ PTA     202               1.20 triphasic          +--------+------------------+-----+---------+--------+ DP      193               1.15 triphasic         +--------+------------------+-----+---------+--------+ +--------+------------------+-----+---------+---------------------+ Left    Lt Pressure (mmHg)IndexWaveform Comment               +--------+------------------+-----+---------+---------------------+ Brachial                       triphasicBlood glucose monitor +--------+------------------+-----+---------+---------------------+ PTA     193               1.15 triphasic                      +--------+------------------+-----+---------+---------------------+ DP      172               1.02 triphasic                      +--------+------------------+-----+---------+---------------------+ +-------+---------------+----------------+ ABI/TBIToday's ABI/TBIPrevious ABI/TBI +-------+---------------+----------------+ Right  1.2                             +-------+---------------+----------------+ Left   1.15                            +-------+---------------+----------------+  Right Doppler Findings: +--------+--------+-----+---------+--------+ Site    PressureIndexDoppler  Comments +--------+--------+-----+---------+--------+ [id number]          triphasic         +--------+--------+-----+---------+--------+ Radial               triphasic         +--------+--------+-----+---------+--------+ Ulnar                triphasic         +--------+--------+-----+---------+--------+  Left Doppler Findings: +--------+--------+-----+---------+---------------------+ Site    PressureIndexDoppler  Comments              +--------+--------+-----+---------+---------------------+ Brachial             triphasicBlood glucose monitor +--------+--------+-----+---------+---------------------+ Radial               triphasic                      +--------+--------+-----+---------+---------------------+ Ulnar                 triphasic                      +--------+--------+-----+---------+---------------------+  Summary: Right Carotid: Velocities in the right ICA are consistent with a 1-39% stenosis. Left Carotid: Velocities in the left ICA are consistent with a 1-39% stenosis. Vertebrals: Bilateral vertebral arteries demonstrate antegrade flow. Right ABI: Resting right  ankle-brachial index is within normal range. No evidence of significant right lower extremity arterial disease. Left ABI: Resting left ankle-brachial index is within normal range. No evidence of significant left lower extremity arterial disease. Right Upper Extremity: Doppler waveforms remain within normal limits with right radial compression. Doppler waveform obliterate with right ulnar compression. Left Upper Extremity: Doppler waveform obliterate with left radial compression. Doppler waveforms remain within normal limits with left ulnar compression.  Electronically signed by Ruta Hinds MD on 04/03/2021 at 5:31:54 PM.    Final      Discharge Medications: Allergies as of 04/16/2021       Reactions   Codeine Shortness Of Breath   "I turn bright red and have difficulty breathing"   Hydrocodone-acetaminophen Shortness Of Breath   "I turn red and have difficulty breathing"        Medication List     STOP taking these medications    carvedilol 25 MG tablet Commonly known as: COREG   hydrALAZINE 25 MG tablet Commonly known as: APRESOLINE   nitroGLYCERIN 0.4 MG SL tablet Commonly known as: NITROSTAT   valsartan 320 MG tablet Commonly known as: DIOVAN       TAKE these medications    amiodarone 200 MG tablet Commonly known as: PACERONE Take 1 tablet (200 mg total) by mouth 2 (two) times daily.   amLODipine 10 MG tablet Commonly known as: NORVASC Take 10 mg by mouth daily.   apixaban 5 MG Tabs tablet Commonly known as: ELIQUIS Take 1 tablet (5 mg total) by mouth 2 (two) times daily.   aspirin EC 81 MG tablet Take  81 mg by mouth daily.   budesonide-formoterol 160-4.5 MCG/ACT inhaler Commonly known as: SYMBICORT Inhale 2 puffs into the lungs daily as needed (asthma).   cephALEXin 500 MG capsule Commonly known as: KEFLEX Take 1 capsule (500 mg total) by mouth every 8 (eight) hours.   colesevelam 625 MG tablet Commonly known as: WELCHOL Take 1,875 mg by mouth 2 (two) times daily with a meal.   famotidine 20 MG tablet Commonly known as: PEPCID Take 20 mg by mouth daily as needed for heartburn.   furosemide 40 MG tablet Commonly known as: LASIX Take 1 tablet (40 mg total) by mouth daily.   liraglutide 18 MG/3ML Sopn Commonly known as: VICTOZA Inject 1.8 mg into the skin daily.   metoprolol tartrate 50 MG tablet Commonly known as: LOPRESSOR Take 1 tablet (50 mg total) by mouth 2 (two) times daily.   pioglitazone-metformin 15-850 MG tablet Commonly known as: ACTOPLUS MET Take 1 tablet by mouth 2 (two) times daily with a meal.   potassium chloride SA 20 MEQ tablet Commonly known as: KLOR-CON Take 1 tablet (20 mEq total) by mouth daily. Take while taking Lasix.   rosuvastatin 40 MG tablet Commonly known as: CRESTOR Take 40 mg by mouth daily.   Toujeo SoloStar 300 UNIT/ML Solostar Pen Generic drug: insulin glargine (1 Unit Dial) Inject 80 Units into the skin daily.   traMADol 50 MG tablet Commonly known as: ULTRAM Take 1-2 tablets (50-100 mg total) by mouth every 6 (six) hours as needed for moderate pain.               Durable Medical Equipment  (From admission, onward)           Start     Ordered   04/13/21 1626  For home use only DME Walker rolling  Once       Comments: Bariatric  Question  Answer Comment  Walker: With 5 Inch Wheels   Patient needs a walker to treat with the following condition Physical deconditioning      04/13/21 1626           The patient has been discharged on:   1.Beta Blocker:  Yes [  x ]                              No   [   ]                               If No, reason:  2.Ace Inhibitor/ARB: Yes [   ]                                     No  [  x  ]                                     If No, reason: BP too soft to start as inpatient  3.Statin:   Yes [ x  ]                  No  [   ]                  If No, reason:  4.Ecasa:  Yes  [  x ]                  No   [   ]                  If No, reason:  Patient had ACS upon admission:YES  Plavix/P2Y12 inhibitor: Yes [   ]                                      No  [   x] Patient had PAF so Plavix was stopped and he was put on Apixaban  Follow Up Appointments:  Follow-up Information     Revankar, Reita Cliche, MD. Go on 05/15/2021.   Specialty: Cardiology Why: Appointment time is at 1:00 pm Contact information: [place] [address] [phone]         Lajuana Matte, MD Follow up on 04/20/2021.   Specialty: Cardiothoracic Surgery Why: Appointment is VIRTUAL. Please do NOT go to the office. Appointment time is at 2:40 pm Contact information: [place] [address] [phone]                 Signed: Joline Maxcy 04/16/2021, 8:06 AM

## 2021-04-11 NOTE — Progress Notes (Signed)
CARDIAC REHAB PHASE I   PRE:  Rate/Rhythm: 97 aib  BP:  Supine:   Sitting: 128/84  Standing:    SaO2: 96%RA  MODE:  Ambulation: 370 ft   POST:  Rate/Rhythm: 137 afib   101 resting To bathroom    SaO2: 96%RA [id number] Pt walked 370 ft on RA with rolling walker and asst x 1 (I managed wound vac). Stopped four times to rest. Encouraged pursed lip breathing. HR elevated to 130's. Requested to go to bathroom after walk. Assisted to bathroom and then helped pt get cleaned up. To recliner after walk. Call bell in reach. Encouraged two more walks. Does not have walker at home.   Luetta Nutting, RN BSN  04/11/2021 9:18 AM

## 2021-04-11 NOTE — TOC Benefit Eligibility Note (Signed)
Patient Product/process development scientist completed.    The patient is currently admitted and upon discharge could be taking Eliquis 5 mg.  The current 30 day co-pay is, $30.00.   The patient is currently admitted and upon discharge could be taking Xarelto 20mg .  The current 30 day co-pay is, $30.00.   The patient is insured through     [employer], CPhT Pharmacy Patient Advocate Specialist [facility] Antimicrobial Stewardship Team Direct Number: [phone]  Fax: [phone]

## 2021-04-11 NOTE — Progress Notes (Signed)
Prevena wound vac removed from sternum. Sternal wound is clean and dry, except most lower portion is slightly superficially dehisced with serous drainage ( likely from fat necrosis). No surrounding erythema. Dry 4x4 s with tape applied.

## 2021-04-11 NOTE — Progress Notes (Signed)
Mobility Specialist: Progress Note    04/11/21 1514  Mobility  Activity Ambulated in hall  Level of Assistance Independent  Assistive Device None  Distance Ambulated (ft) 470 ft  Mobility Ambulated independently in hallway  Mobility Response Tolerated well  Mobility performed by Mobility specialist  Bed Position Chair  $Mobility charge 1 Mobility    Pre-Mobility: 87 HR, 139/83 BP, 96% SpO2 During Mobility: 131 HR, 99% SpO2 Post-Mobility: 88 HR, 162/96 BP, 99% SpO2  Pt independent to stand from chair. Pt required x2 standing breaks d/t SOB and fatigue which progressed w/ distance. Pt also used hand rail as guide for comfort but did not use for support. Pt c/o soreness in feet due to swelling. Pt to chair after walk w/ call bell by his side.      [facility] Tyneka Scafidi Mobility Specialist Mobility Specialist Phone: [phone]

## 2021-04-12 LAB — GLUCOSE, CAPILLARY
Glucose-Capillary: 142 mg/dL — ABNORMAL HIGH (ref 70–99)
Glucose-Capillary: 187 mg/dL — ABNORMAL HIGH (ref 70–99)
Glucose-Capillary: 166 mg/dL — ABNORMAL HIGH (ref 70–99)
Glucose-Capillary: 162 mg/dL — ABNORMAL HIGH (ref 70–99)

## 2021-04-12 MED ORDER — METOLAZONE 5 MG PO TABS
5.0000 mg | ORAL_TABLET | Freq: Once | ORAL | Status: AC
  Administered 2021-04-12: 5 mg via ORAL
  Filled 2021-04-12: qty 1

## 2021-04-12 MED ORDER — POTASSIUM CHLORIDE CRYS ER 20 MEQ PO TBCR
20.0000 meq | EXTENDED_RELEASE_TABLET | Freq: Every day | ORAL | Status: DC
  Administered 2021-04-12 – 2021-04-15 (×4): 20 meq via ORAL
  Filled 2021-04-12 (×4): qty 1

## 2021-04-12 MED ORDER — INSULIN DETEMIR 100 UNIT/ML ~~LOC~~ SOLN
70.0000 [IU] | Freq: Every day | SUBCUTANEOUS | Status: DC
  Administered 2021-04-12 – 2021-04-16 (×5): 70 [IU] via SUBCUTANEOUS
  Filled 2021-04-12 (×6): qty 0.7

## 2021-04-12 MED ORDER — FUROSEMIDE 10 MG/ML IJ SOLN
40.0000 mg | Freq: Once | INTRAMUSCULAR | Status: AC
  Administered 2021-04-12: 40 mg via INTRAVENOUS
  Filled 2021-04-12: qty 4

## 2021-04-12 MED ORDER — CEPHALEXIN 500 MG PO CAPS
500.0000 mg | ORAL_CAPSULE | Freq: Three times a day (TID) | ORAL | Status: DC
  Administered 2021-04-12 – 2021-04-16 (×12): 500 mg via ORAL
  Filled 2021-04-12 (×12): qty 1

## 2021-04-12 MED ORDER — FUROSEMIDE 40 MG PO TABS
40.0000 mg | ORAL_TABLET | Freq: Every day | ORAL | Status: DC
  Administered 2021-04-13 – 2021-04-16 (×4): 40 mg via ORAL
  Filled 2021-04-12 (×4): qty 1

## 2021-04-12 MED FILL — Electrolyte-R (PH 7.4) Solution: INTRAVENOUS | Qty: 4000 | Status: AC

## 2021-04-12 MED FILL — Sodium Chloride IV Soln 0.9%: INTRAVENOUS | Qty: 2000 | Status: AC

## 2021-04-12 MED FILL — Lidocaine HCl Local Preservative Free (PF) Inj 2%: INTRAMUSCULAR | Qty: 15 | Status: AC

## 2021-04-12 MED FILL — Mannitol IV Soln 20%: INTRAVENOUS | Qty: 500 | Status: AC

## 2021-04-12 MED FILL — Calcium Chloride Inj 10%: INTRAVENOUS | Qty: 10 | Status: AC

## 2021-04-12 MED FILL — Potassium Chloride Inj 2 mEq/ML: INTRAVENOUS | Qty: 40 | Status: AC

## 2021-04-12 MED FILL — Sodium Bicarbonate IV Soln 8.4%: INTRAVENOUS | Qty: 100 | Status: AC

## 2021-04-12 MED FILL — Heparin Sodium (Porcine) Inj 1000 Unit/ML: INTRAMUSCULAR | Qty: 20 | Status: AC

## 2021-04-12 MED FILL — Heparin Sodium (Porcine) Inj 1000 Unit/ML: INTRAMUSCULAR | Qty: 30 | Status: AC

## 2021-04-12 NOTE — Progress Notes (Addendum)
[address]       [place] [address]             [phone]        7 Days Post-Op Procedure(s) (LRB): CORONARY ARTERY BYPASS GRAFTING (CABG) X 4, USING LEFT INTERNAL MAMMARY ARTERY, LEFT RADIAL ARTERY, AND RIGHT LEG GREATER SAPHENOUS VEIN HARVESTED ENDOSCOPICALLY (N/A) RADIAL ARTERY HARVEST (Left) TRANSESOPHAGEAL ECHOCARDIOGRAM (TEE) (N/A)  Subjective: Patient feeling down this am. We talked about surgery and his cats.  Objective: Vital signs in last 24 hours: Temp:  [97.8 F (36.6 C)-98.5 F (36.9 C)] 98.5 F (36.9 C) (08/18 0609) Pulse Rate:  [78-96] 88 (08/18 0609) Cardiac Rhythm: Normal sinus rhythm (08/18 0545) Resp:  [20-22] 20 (08/18 0609) BP: (126-164)/(74-96) 152/92 (08/18 0609) SpO2:  [95 %-100 %] 100 % (08/18 0609) Weight:  [173.5 kg] 173.5 kg (08/18 0609)  Pre op weight  166.8 (?) kg Current Weight  04/12/21 (!) 173.5 kg       Intake/Output from previous day: 08/17 0701 - 08/18 0700 In: 293 [P.O.:290; I.V.:3] Out: 1000 [Urine:1000]   Physical Exam:  Cardiovascular: RRR Pulmonary: Slightly diminished bibasilar breath sounds Abdomen: Soft, non tender, obese, bowel sounds present. Extremities: ++ bilateral lower extremity edema. Wounds: Sternal wound has serous draiange. Portion of skin suture removed. LUEs and RLE wounds are clean and dry.  No erythema or signs of infection.  Lab Results: CBC: Recent Labs    04/10/21 0046  WBC 13.1*  HGB 11.3*  HCT 33.0*  PLT 288    BMET:  Recent Labs    04/10/21 0046 04/11/21 0809  NA 134* 135  K 3.7 4.1  CL 102 104  CO2 23 22  GLUCOSE 227* 210*  BUN 29* 19  CREATININE 1.40* 1.13  CALCIUM 8.4* 8.4*     PT/INR:  Lab Results  Component Value Date   INR 1.4 (H) 04/05/2021   INR 1.0 04/04/2021   INR 1.0 10/01/2017   ABG:  INR: Will add last result for INR, ABG once components are confirmed Will add last 4 CBG results once components are  confirmed  Assessment/Plan:  1. CV - PAF. SR this am. On Amiodarone 400 mg bid, Lopressor 50 mg bid, Apixaban 5 mg bid, and Amlodipine 10 mg daily for radial artery harvest. 2.  Pulmonary - On room air. Encourage incentive spirometer 3. Volume Overload - On Lasix 40 mg daily. Will give IV and a dose of Zaroxolyn this am 4.  Expected post op acute blood loss anemia - Last H and H stable at 11.3 and 33 5. DM-CBGs 275/225/142. On Acotplus Met and Insulin prior to surgery. Will increased Insulin for better glucose control.  Will restart Actoplus at discharge. Pre op HGA1C 6.5 6. Creatinine yesterday down to 1.13 7. Sternal drainage-serous. No erythema. This is likely from fat necrosis. As discussed with Dr. Kipp Brood, will start prophylactic antibiotic. Daily and PRN dressing changes  Donielle M ZimmermanPA-C 04/12/2021,7:13 AM  Agree with above. Starting prophylactic antibiotics for chest drainage.  Likely just fat necrosis given his body habitus. Continue diuresis. Dispo planning.  Baldomero Mirarchi Bary Leriche

## 2021-04-12 NOTE — Progress Notes (Signed)
CARDIAC REHAB PHASE I   PRE:  Rate/Rhythm: 93 ?SR PACs  vs atrial fib  BP:  Supine:   Sitting: 148/94  Standing:    SaO2: 95%RA  MODE:  Ambulation: 470 ft   POST:  Rate/Rhythm: 126 afib   92 with rest  BP:  Supine:   Sitting: 149/101  Standing:    SaO2: 98%RA 1016-1042 Pt walked 470 ft on RA independently holding to side rail as needed. Stopped several times to catch his breath. Sats good on RA. HR to 126 atrial fib. To sitting on side of bed after walk. Encouraged IS and two more walks today.  Luetta Nutting, RN BSN  04/12/2021 10:39 AM

## 2021-04-12 NOTE — Plan of Care (Signed)
  Problem: Education: Goal: Knowledge of General Education information will improve Description: Including pain rating scale, medication(s)/side effects and non-pharmacologic comfort measures Outcome: Progressing   Problem: Health Behavior/Discharge Planning: Goal: Ability to manage health-related needs will improve Outcome: Progressing   Problem: Education: Goal: Understanding of CV disease, CV risk reduction, and recovery process will improve Outcome: Progressing Goal: Individualized Educational Video(s) Outcome: Progressing   Problem: Activity: Goal: Ability to return to baseline activity level will improve Outcome: Progressing   Problem: Cardiovascular: Goal: Ability to achieve and maintain adequate cardiovascular perfusion will improve Outcome: Progressing Goal: Vascular access site(s) Level 0-1 will be maintained Outcome: Progressing   Problem: Health Behavior/Discharge Planning: Goal: Ability to safely manage health-related needs after discharge will improve Outcome: Progressing   Problem: Education: Goal: Will demonstrate proper wound care and an understanding of methods to prevent future damage Outcome: Progressing Goal: Knowledge of disease or condition will improve Outcome: Progressing Goal: Knowledge of the prescribed therapeutic regimen will improve Outcome: Progressing Goal: Individualized Educational Video(s) Outcome: Progressing   Problem: Activity: Goal: Risk for activity intolerance will decrease Outcome: Progressing   Problem: Cardiac: Goal: Will achieve and/or maintain hemodynamic stability Outcome: Progressing   Problem: Clinical Measurements: Goal: Postoperative complications will be avoided or minimized Outcome: Progressing   Problem: Respiratory: Goal: Respiratory status will improve Outcome: Progressing   Problem: Skin Integrity: Goal: Wound healing without signs and symptoms of infection Outcome: Progressing Goal: Risk for impaired  skin integrity will decrease Outcome: Progressing   Problem: Urinary Elimination: Goal: Ability to achieve and maintain adequate renal perfusion and functioning will improve Outcome: Progressing   

## 2021-04-12 NOTE — Progress Notes (Signed)
Mobility Specialist: Progress Note   04/12/21 1517  Mobility  Activity Ambulated in hall  Level of Assistance Independent  Assistive Device None  Distance Ambulated (ft) 470 ft  Mobility Ambulated with assistance in hallway  Mobility Response Tolerated well  Mobility performed by Mobility specialist  Bed Position Chair  $Mobility charge 1 Mobility   Pre-Mobility: 88 HR, 156/99 BP, 95% SpO2 During Mobility: 121 HR, 95% SpO2 Post-Mobility: 102 HR, 178/93 BP, 96% SpO2  Pt independent with standing from the chair, required x2 attempts to stand. Pt to BR before ambulation, void successful. Pt to recliner after walk. RN notified of elevated BP.   [facility] Coye Dawood Mobility Specialist Mobility Specialist Phone: [phone]

## 2021-04-13 LAB — GLUCOSE, CAPILLARY
Glucose-Capillary: 116 mg/dL — ABNORMAL HIGH (ref 70–99)
Glucose-Capillary: 224 mg/dL — ABNORMAL HIGH (ref 70–99)
Glucose-Capillary: 136 mg/dL — ABNORMAL HIGH (ref 70–99)
Glucose-Capillary: 88 mg/dL (ref 70–99)

## 2021-04-13 MED ORDER — METOLAZONE 5 MG PO TABS
5.0000 mg | ORAL_TABLET | Freq: Every day | ORAL | Status: DC
  Administered 2021-04-13 – 2021-04-16 (×4): 5 mg via ORAL
  Filled 2021-04-13 (×4): qty 1

## 2021-04-13 MED ORDER — DOCUSATE SODIUM 100 MG PO CAPS
200.0000 mg | ORAL_CAPSULE | Freq: Every day | ORAL | Status: DC
  Administered 2021-04-13 – 2021-04-15 (×3): 200 mg via ORAL
  Filled 2021-04-13 (×4): qty 2

## 2021-04-13 MED ORDER — AMIODARONE IV BOLUS ONLY 150 MG/100ML
150.0000 mg | Freq: Once | INTRAVENOUS | Status: AC
  Administered 2021-04-13: 150 mg via INTRAVENOUS
  Filled 2021-04-13: qty 100

## 2021-04-13 NOTE — Progress Notes (Signed)
[id number] Offered to walk with pt but not feeling quite up to it right now. Education completed regarding sternal precautions and staying in the tube. Encouraged IS and gave walking instructions. Gave heart healthy and diabetic diets. Discussed CRP 2 and will send referral letter to [facility]. Will check back later to walk. Pt has been getting up and going to bathroom independently. Luetta Nutting RN BSN 04/13/2021 9:01 AM

## 2021-04-13 NOTE — Progress Notes (Addendum)
[address]       [place], [address]             [phone]        8 Days Post-Op Procedure(s) (LRB): CORONARY ARTERY BYPASS GRAFTING (CABG) X 4, USING LEFT INTERNAL MAMMARY ARTERY, LEFT RADIAL ARTERY, AND RIGHT LEG GREATER SAPHENOUS VEIN HARVESTED ENDOSCOPICALLY (N/A) RADIAL ARTERY HARVEST (Left) TRANSESOPHAGEAL ECHOCARDIOGRAM (TEE) (N/A)  Subjective: Patient had a bowel movement yesterday  Objective: Vital signs in last 24 hours: Temp:  [97.9 F (36.6 C)-98.7 F (37.1 C)] 98.7 F (37.1 C) (08/19 0436) Pulse Rate:  [20-94] 89 (08/19 0436) Cardiac Rhythm: Atrial fibrillation (08/18 2318) Resp:  [16-20] 17 (08/19 0436) BP: (118-151)/(76-94) 118/83 (08/19 0436) SpO2:  [95 %-100 %] 97 % (08/19 0436) Weight:  [171.2 kg] 171.2 kg (08/19 0630)  Pre op weight  166.8 (?) kg Current Weight  04/13/21 (!) 171.2 kg      Intake/Output from previous day: 08/18 0701 - 08/19 0700 In: 1176 [P.O.:1176] Out: 3575 [Urine:3575]   Physical Exam:  Cardiovascular: RRR Pulmonary: Slightly diminished bibasilar breath sounds Abdomen: Soft, non tender, obese, bowel sounds present. Extremities: ++ bilateral lower extremity edema. Wounds: Sternal wound has serous drainage.LUEs and RLE wounds are clean and dry.  No erythema or signs of infection.  Lab Results: CBC: No results for input(s): WBC, HGB, HCT, PLT in the last 72 hours.  BMET:  Recent Labs    04/11/21 0809  NA 135  K 4.1  CL 104  CO2 22  GLUCOSE 210*  BUN 19  CREATININE 1.13  CALCIUM 8.4*     PT/INR:  Lab Results  Component Value Date   INR 1.4 (H) 04/05/2021   INR 1.0 04/04/2021   INR 1.0 10/01/2017   ABG:  INR: Will add last result for INR, ABG once components are confirmed Will add last 4 CBG results once components are confirmed  Assessment/Plan:  1. CV - PAF. A fib with HR 90's-low 100's. On Amiodarone 400 mg bid, Lopressor 50 mg bid, Apixaban 5 mg bid, and Amlodipine 10 mg  daily for radial artery harvest. Unable to titrate BB further. Will give one time IV Amiodarone bolus. When he is in a fib, it is mostly rate controlled. 2.  Pulmonary - On room air. Encourage incentive spirometer 3. Volume Overload - On Lasix 40 mg daily. Will give another dose of Zaroxolyn today as had good diuresis with this yesterday 4.  Expected post op acute blood loss anemia - Last H and H stable at 11.3 and 33 5. DM-CBGs 166/162/116. On Acotplus Met and Insulin prior to surgery.  Will restart Actoplus at discharge. Pre op HGA1C 6.5 6. Creatinine yesterday down to 1.13 7. Sternal drainage-serous and is decreasing. No erythema. This is likely from fat necrosis. As discussed with Dr. Kipp Brood, will start prophylactic antibiotic. Daily and PRN dressing changes  Donielle M ZimmermanPA-C 04/13/2021,7:09 AM   Agree with above. Continue dressing changes to chest. Continue diuresis Dispo planning.  Starleen Trussell Bary Leriche

## 2021-04-13 NOTE — Progress Notes (Signed)
CARDIAC REHAB PHASE I   PRE:  Rate/Rhythm: 100 afib  BP:  Supine:   Sitting: 143/87  Standing:    SaO2: 96%RA  MODE:  Ambulation: 700 ft   POST:  Rate/Rhythm: 127 afib  BP:  Supine:   Sitting: 173/89  Standing:    SaO2: 97%RA hall and room 1030-1057 Answered pt's questions re ed done earlier. Pt walked 700 ft independently in hall. Adhered to sternal precautions. Stopped as needed due to SOB. Can walk independently as long as he is feeling well. Sats good on RA.   Luetta Nutting, RN BSN  04/13/2021 10:53 AM

## 2021-04-14 LAB — CBC
HCT: 32.3 % — ABNORMAL LOW (ref 39.0–52.0)
Hemoglobin: 10.8 g/dL — ABNORMAL LOW (ref 13.0–17.0)
MCH: 30.5 pg (ref 26.0–34.0)
MCHC: 33.4 g/dL (ref 30.0–36.0)
MCV: 91.2 fL (ref 80.0–100.0)
Platelets: 438 10*3/uL — ABNORMAL HIGH (ref 150–400)
RBC: 3.54 MIL/uL — ABNORMAL LOW (ref 4.22–5.81)
RDW: 14.1 % (ref 11.5–15.5)
WBC: 11.2 10*3/uL — ABNORMAL HIGH (ref 4.0–10.5)
nRBC: 0.2 % (ref 0.0–0.2)

## 2021-04-14 LAB — GLUCOSE, CAPILLARY
Glucose-Capillary: 96 mg/dL (ref 70–99)
Glucose-Capillary: 113 mg/dL — ABNORMAL HIGH (ref 70–99)
Glucose-Capillary: 157 mg/dL — ABNORMAL HIGH (ref 70–99)
Glucose-Capillary: 168 mg/dL — ABNORMAL HIGH (ref 70–99)

## 2021-04-14 NOTE — Progress Notes (Signed)
Mobility Specialist: Progress Note   04/14/21 1147  Mobility  Activity Ambulated in hall  Level of Assistance Independent  Assistive Device None  Distance Ambulated (ft) 470 ft  Mobility Ambulated independently in hallway  Mobility Response Tolerated well  Mobility performed by Mobility specialist  $Mobility charge 1 Mobility   Pre-Mobility: 94 HR, 97% SpO2 During Mobility: 129 HR, 98% SpO2 Post-Mobility: 126 HR, 151/88 BP, 95% SpO2  Pt stopped for 4 short standing breaks d/t DOE, otherwise asx. Pt sitting EOB with call bell and phone in reach.   [facility] Solana Coggin Mobility Specialist Mobility Specialist Phone: [phone]

## 2021-04-14 NOTE — Progress Notes (Signed)
CARDIAC REHAB PHASE I   Returned back to see if pt was available to walk now. Mobility team had just walked with him. He walked 441ft with them. Pt stated he would walk again at least one more time today. Pt understands importance of ambulation.   Norris Cross, MS, ACSM-CEP 04/14/2021 12:45 PM

## 2021-04-14 NOTE — Progress Notes (Addendum)
[address]       [place] [address]             [phone]      9 Days Post-Op Procedure(s) (LRB): CORONARY ARTERY BYPASS GRAFTING (CABG) X 4, USING LEFT INTERNAL MAMMARY ARTERY, LEFT RADIAL ARTERY, AND RIGHT LEG GREATER SAPHENOUS VEIN HARVESTED ENDOSCOPICALLY (N/A) RADIAL ARTERY HARVEST (Left) TRANSESOPHAGEAL ECHOCARDIOGRAM (TEE) (N/A) Subjective: Feels okay this morning, he did some walking in the halls yesterday but still getting short of breath  Objective: Vital signs in last 24 hours: Temp:  [97.9 F (36.6 C)-98.5 F (36.9 C)] 98.3 F (36.8 C) (08/20 0521) Pulse Rate:  [69-98] 92 (08/20 0521) Cardiac Rhythm: Normal sinus rhythm (08/19 1900) Resp:  [12-20] 18 (08/20 0521) BP: (108-155)/(78-90) 108/78 (08/20 0521) SpO2:  [97 %-100 %] 100 % (08/20 0521) Weight:  [168.8 kg] 168.8 kg (08/20 0521)     Intake/Output from previous day: 08/19 0701 - 08/20 0700 In: 457.2 [P.O.:360; I.V.:97.2] Out: 2790 [Urine:2790] Intake/Output this shift: No intake/output data recorded.  General appearance: alert, cooperative, and no distress Heart: regular rate and rhythm, S1, S2 normal, no murmur, click, rub or gallop Lungs: clear to auscultation bilaterally Abdomen: soft, non-tender; bowel sounds normal; no masses,  no organomegaly Extremities: 1-2 + non pitting edema Wound: some clear to blood-tinged drainage on the sternal dressing  Lab Results: Recent Labs    04/14/21 0137  WBC 11.2*  HGB 10.8*  HCT 32.3*  PLT 438*   BMET:  Recent Labs    04/11/21 0809  NA 135  K 4.1  CL 104  CO2 22  GLUCOSE 210*  BUN 19  CREATININE 1.13  CALCIUM 8.4*    PT/INR: No results for input(s): LABPROT, INR in the last 72 hours. ABG    Component Value Date/Time   PHART 7.370 04/06/2021 0002   HCO3 19.0 (L) 04/06/2021 0002   TCO2 20 (L) 04/06/2021 0002   ACIDBASEDEF 6.0 (H) 04/06/2021 0002   O2SAT 94.0 04/06/2021 0002   CBG (last 3)  Recent Labs     04/13/21 1640 04/13/21 2133 04/14/21 0632  GLUCAP 136* 88 96    Assessment/Plan: S/P Procedure(s) (LRB): CORONARY ARTERY BYPASS GRAFTING (CABG) X 4, USING LEFT INTERNAL MAMMARY ARTERY, LEFT RADIAL ARTERY, AND RIGHT LEG GREATER SAPHENOUS VEIN HARVESTED ENDOSCOPICALLY (N/A) RADIAL ARTERY HARVEST (Left) TRANSESOPHAGEAL ECHOCARDIOGRAM (TEE) (N/A)  1. CV - PAF. NSR this morning. On Amiodarone 400 mg bid, Lopressor 50 mg bid, Apixaban 5 mg bid, and Amlodipine 10 mg daily for radial artery harvest.  2.  Pulmonary - On room air, excellent oxygen saturation. Encourage incentive spirometer 3. Volume Overload - On Lasix 40 mg daily + zaroxolyn $RemoveBef'5mg'ugDtewesYN$ . Weight continues to trend down 4.  Expected post op acute blood loss anemia - Last H and H stable at 10.8/32.3 5. DM-CBGs 136/88/96. On Acotplus Met and Insulin prior to surgery.  Will restart Actoplus at discharge. Pre op HGA1C 6.5 6. Creatinine down to 1.13 7. Sternal drainage-serous and is decreasing. No erythema. This is likely from fat necrosis. Continue Keflex TID. Daily and PRN dressing changes   LOS: 12 days    Kevin Lawrence 04/14/2021  Patient seen and examined, agree with above Responding to diuretics Still has some sternal drainage, non purulent, on Keflex  Diesha Rostad C. Roxan Hockey, MD [facility] [phone]

## 2021-04-14 NOTE — Progress Notes (Signed)
CARDIAC REHAB PHASE I    Asked pt if he wanted to go for a walk this morning. He stated that someone was getting ready to give him a bath but he has been walking the halls independently multiple times a day. NT arrived to clean him. I told him if I got a chance I would check back with him later and if not he said he would walk by himself. NT in room.  [phone]  Norris Cross, MS, ACSM-CEP

## 2021-04-15 LAB — GLUCOSE, CAPILLARY
Glucose-Capillary: 139 mg/dL — ABNORMAL HIGH (ref 70–99)
Glucose-Capillary: 183 mg/dL — ABNORMAL HIGH (ref 70–99)
Glucose-Capillary: 112 mg/dL — ABNORMAL HIGH (ref 70–99)
Glucose-Capillary: 117 mg/dL — ABNORMAL HIGH (ref 70–99)

## 2021-04-15 LAB — BASIC METABOLIC PANEL
Anion gap: 10 (ref 5–15)
BUN: 16 mg/dL (ref 6–20)
CO2: 29 mmol/L (ref 22–32)
Calcium: 8.5 mg/dL — ABNORMAL LOW (ref 8.9–10.3)
Chloride: 96 mmol/L — ABNORMAL LOW (ref 98–111)
Creatinine, Ser: 1.39 mg/dL — ABNORMAL HIGH (ref 0.61–1.24)
GFR, Estimated: 59 mL/min — ABNORMAL LOW (ref >60)
Glucose, Bld: 127 mg/dL — ABNORMAL HIGH (ref 70–99)
Potassium: 3.2 mmol/L — ABNORMAL LOW (ref 3.5–5.1)
Sodium: 135 mmol/L (ref 135–145)

## 2021-04-15 MED ORDER — POTASSIUM CHLORIDE CRYS ER 20 MEQ PO TBCR
40.0000 meq | EXTENDED_RELEASE_TABLET | Freq: Two times a day (BID) | ORAL | Status: AC
  Administered 2021-04-15 (×2): 40 meq via ORAL
  Filled 2021-04-15 (×2): qty 2

## 2021-04-15 NOTE — Progress Notes (Addendum)
[address]       [place], [address]             [phone]      10 Days Post-Op Procedure(s) (LRB): CORONARY ARTERY BYPASS GRAFTING (CABG) X 4, USING LEFT INTERNAL MAMMARY ARTERY, LEFT RADIAL ARTERY, AND RIGHT LEG GREATER SAPHENOUS VEIN HARVESTED ENDOSCOPICALLY (N/A) RADIAL ARTERY HARVEST (Left) TRANSESOPHAGEAL ECHOCARDIOGRAM (TEE) (N/A) Subjective: Feels okay this morning, some continued shortness of breath with ambulation. Pain has been well controlled.  Objective: Vital signs in last 24 hours: Temp:  [97.6 F (36.4 C)-98.7 F (37.1 C)] 98.7 F (37.1 C) (08/21 0600) Pulse Rate:  [79-92] 79 (08/21 0600) Cardiac Rhythm: Normal sinus rhythm (08/20 1900) Resp:  [15-22] 22 (08/21 0600) BP: (104-151)/(49-88) 121/69 (08/21 0600) SpO2:  [93 %-97 %] 93 % (08/21 0600) Weight:  [167.2 kg] 167.2 kg (08/21 0600)     Intake/Output from previous day: 08/20 0701 - 08/21 0700 In: 490 [P.O.:480; I.V.:10] Out: 2520 [Urine:2520] Intake/Output this shift: No intake/output data recorded.  General appearance: alert, cooperative, and no distress Heart: irregularly irregular rhythm Lungs: clear to auscultation bilaterally Abdomen: soft, non-tender; bowel sounds normal; no masses,  no organomegaly Extremities: 1-2+ pitting edema Wound: some sternal drainage but significantly less than yesterday  Lab Results: Recent Labs    04/14/21 0137  WBC 11.2*  HGB 10.8*  HCT 32.3*  PLT 438*   BMET:  Recent Labs    04/15/21 0207  NA 135  K 3.2*  CL 96*  CO2 29  GLUCOSE 127*  BUN 16  CREATININE 1.39*  CALCIUM 8.5*    PT/INR: No results for input(s): LABPROT, INR in the last 72 hours. ABG    Component Value Date/Time   PHART 7.370 04/06/2021 0002   HCO3 19.0 (L) 04/06/2021 0002   TCO2 20 (L) 04/06/2021 0002   ACIDBASEDEF 6.0 (H) 04/06/2021 0002   O2SAT 94.0 04/06/2021 0002   CBG (last 3)  Recent Labs    04/14/21 1602 04/14/21 2322 04/15/21 0607   GLUCAP 157* 168* 139*    Assessment/Plan: S/P Procedure(s) (LRB): CORONARY ARTERY BYPASS GRAFTING (CABG) X 4, USING LEFT INTERNAL MAMMARY ARTERY, LEFT RADIAL ARTERY, AND RIGHT LEG GREATER SAPHENOUS VEIN HARVESTED ENDOSCOPICALLY (N/A) RADIAL ARTERY HARVEST (Left) TRANSESOPHAGEAL ECHOCARDIOGRAM (TEE) (N/A)  1. CV - PAF. rate-controlled Afib this morning . On Amiodarone 400 mg bid, Lopressor 50 mg bid, Apixaban 5 mg bid, and Amlodipine 10 mg daily for radial artery harvest.  2.  Pulmonary - On room air, excellent oxygen saturation. Encourage incentive spirometer 3. Volume Overload - On Lasix 40 mg daily + zaroxolyn 86m. Weight continues to trend down 4.  Expected post op acute blood loss anemia - Last H and H stable at 10.8/32.3 5. DM-CBGs 136/88/96. On Acotplus Met and Insulin prior to surgery.  Will restart Actoplus at discharge. Pre op HGA1C 6.5 6. Creatinine down to 1.13 7. Sternal drainage-serous and is decreasing. No erythema. This is likely from fat necrosis. Continue Keflex TID. Daily and PRN dressing changes.   Plan: Sternal incision drainage improving. Only changed once yesterday. Continued fluid overload. Will plan to do one more day of lasix + zaroxolyn and then discharge on daily lasix. [place]. Incision care and sternal precautions were discussed.    LOS: 13 days    TElgie Collard8/21/2022 Patient seen and examined, agree with above Back in A fib, rate controlled,  on Eliquis Still volume overloaded, continue diuresis Sternal drainage improved  Kevin Lipps C. Roxan Hockey, MD [facility] [phone]

## 2021-04-15 NOTE — Progress Notes (Signed)
Mobility Specialist: Progress Note   04/15/21 1247  Mobility  Activity Ambulated in hall  Level of Assistance Independent  Assistive Device None  Distance Ambulated (ft) 780 ft  Mobility Ambulated independently in hallway  Mobility Response Tolerated well  Mobility performed by Mobility specialist  Bed Position Chair  $Mobility charge 1 Mobility   Pre-Mobility: 95 HR, 98% SpO2 Post-Mobility: 82 HR, 159/80 BP, 100% SpO2  Pt independent to stand from the chair as well as during ambulation. Pt stopped for three short standing breaks d/t SOB and low back pain, no rating given. Pt back to recliner after walk with call bell in reach.   [facility] Shay Jhaveri Mobility Specialist Mobility Specialist Phone: [phone]

## 2021-04-16 ENCOUNTER — Other Ambulatory Visit (HOSPITAL_COMMUNITY): Payer: Self-pay

## 2021-04-16 DIAGNOSIS — I249 Acute ischemic heart disease, unspecified: Secondary | ICD-10-CM | POA: Diagnosis not present

## 2021-04-16 LAB — BASIC METABOLIC PANEL
Anion gap: 9 (ref 5–15)
BUN: 14 mg/dL (ref 6–20)
CO2: 31 mmol/L (ref 22–32)
Calcium: 8.7 mg/dL — ABNORMAL LOW (ref 8.9–10.3)
Chloride: 96 mmol/L — ABNORMAL LOW (ref 98–111)
Creatinine, Ser: 1.28 mg/dL — ABNORMAL HIGH (ref 0.61–1.24)
GFR, Estimated: >60 mL/min (ref >60)
Glucose, Bld: 85 mg/dL (ref 70–99)
Potassium: 3.2 mmol/L — ABNORMAL LOW (ref 3.5–5.1)
Sodium: 136 mmol/L (ref 135–145)

## 2021-04-16 LAB — GLUCOSE, CAPILLARY: Glucose-Capillary: 74 mg/dL (ref 70–99)

## 2021-04-16 MED ORDER — METOPROLOL TARTRATE 50 MG PO TABS
50.0000 mg | ORAL_TABLET | Freq: Two times a day (BID) | ORAL | 1 refills | Status: DC
  Filled 2021-04-16: qty 60, 30d supply, fill #0

## 2021-04-16 MED ORDER — FUROSEMIDE 40 MG PO TABS
40.0000 mg | ORAL_TABLET | Freq: Every day | ORAL | 1 refills | Status: DC
  Filled 2021-04-16: qty 30, 30d supply, fill #0

## 2021-04-16 MED ORDER — TRAMADOL HCL 50 MG PO TABS
50.0000 mg | ORAL_TABLET | Freq: Four times a day (QID) | ORAL | 0 refills | Status: DC | PRN
  Filled 2021-04-16: qty 30, 4d supply, fill #0

## 2021-04-16 MED ORDER — AMIODARONE HCL 200 MG PO TABS
200.0000 mg | ORAL_TABLET | Freq: Two times a day (BID) | ORAL | 0 refills | Status: DC
Start: 2021-04-16 — End: 2021-05-15
  Filled 2021-04-16: qty 60, 30d supply, fill #0

## 2021-04-16 MED ORDER — POTASSIUM CHLORIDE CRYS ER 20 MEQ PO TBCR
40.0000 meq | EXTENDED_RELEASE_TABLET | Freq: Once | ORAL | Status: AC
  Administered 2021-04-16: 40 meq via ORAL
  Filled 2021-04-16: qty 2

## 2021-04-16 MED ORDER — CEPHALEXIN 500 MG PO CAPS
500.0000 mg | ORAL_CAPSULE | Freq: Three times a day (TID) | ORAL | 0 refills | Status: DC
  Filled 2021-04-16: qty 18, 6d supply, fill #0

## 2021-04-16 MED ORDER — APIXABAN 5 MG PO TABS
5.0000 mg | ORAL_TABLET | Freq: Two times a day (BID) | ORAL | 1 refills | Status: DC
Start: 2021-04-16 — End: 2021-05-17
  Filled 2021-04-16: qty 60, 30d supply, fill #0

## 2021-04-16 MED ORDER — POTASSIUM CHLORIDE CRYS ER 20 MEQ PO TBCR
20.0000 meq | EXTENDED_RELEASE_TABLET | Freq: Every day | ORAL | 1 refills | Status: DC
  Filled 2021-04-16: qty 30, 30d supply, fill #0

## 2021-04-16 NOTE — Progress Notes (Signed)
[id number] Pt for discharge today. No questions re ed done. Encouraged IS. Offered walk. Pt hopes to be discharged soon and saving energy. Will send referral letter to [facility] CRP 2... Luetta Nutting RN BSN 04/16/2021 9:17 AM

## 2021-04-16 NOTE — Progress Notes (Signed)
Progress Note  Patient Name: ORIEL RUMBOLD Date of Encounter: 04/16/2021  [facility] Cardiologist: Garwin Brothers, MD   Subjective   Doing well this morning.  Excited he is able to go home today.  No chest pain or shortness of breath.  No palpitations.  Inpatient Medications    Scheduled Meds:  amiodarone  400 mg Oral Daily   amLODipine  10 mg Oral Daily   apixaban  5 mg Oral BID   aspirin  81 mg Oral Daily   Or   aspirin EC  81 mg Oral Daily   cephALEXin  500 mg Oral Q8H   colesevelam  1,875 mg Oral BID WC   [facility]   Does not apply Once   docusate sodium  200 mg Oral Daily   furosemide  40 mg Oral Daily   insulin aspart  0-20 Units Subcutaneous TID WC   insulin aspart  6 Units Subcutaneous TID WC   insulin detemir  70 Units Subcutaneous Daily   lidocaine  2 patch Transdermal Q24H   metolazone  5 mg Oral Daily   metoprolol tartrate  50 mg Oral BID   pantoprazole  40 mg Oral Daily   rosuvastatin  40 mg Oral Daily   sodium chloride flush  3 mL Intravenous Q12H   Continuous Infusions:  sodium chloride     PRN Meds: sodium chloride, acetaminophen, dextrose, fentaNYL (SUBLIMAZE) injection, metoprolol tartrate, ondansetron (ZOFRAN) IV, sodium chloride flush, traMADol   Vital Signs    Vitals:   04/15/21 2350 04/16/21 0440 04/16/21 0736 04/16/21 0826  BP: 99/64 126/72 111/75   Pulse: 75 76 (!) 33 82  Resp: 20 20 (!) 22   Temp: 97.8 F (36.6 C) 98.1 F (36.7 C) 98.5 F (36.9 C)   TempSrc: Oral Oral Oral   SpO2: 94% 96% 96%   Weight:  (!) 165.3 kg    Height:        Intake/Output Summary (Last 24 hours) at 04/16/2021 0911 Last data filed at 04/16/2021 0440 Gross per 24 hour  Intake 480 ml  Output 2901 ml  Net -2421 ml   Last 3 Weights 04/16/2021 04/15/2021 04/14/2021  Weight (lbs) 364 lb 6.7 oz 368 lb 9.6 oz 372 lb 1.6 oz  Weight (kg) 165.3 kg 167.196 kg 168.783 kg      Telemetry    Atrial fibrillation  with controlled ventricular response, occasional PVCs- Personally Reviewed   Physical Exam  Alert, oriented, no distress GEN: No acute distress.   Neck: No JVD Cardiac: Irregularly irregular, no murmurs, rubs, or gallops.  Respiratory: Clear to auscultation bilaterally. GI: Soft, nontender, non-distended  MS: 1+ bilateral pretibial edema; No deformity. Neuro:  Nonfocal  Psych: Normal affect   Labs    High Sensitivity Troponin:  No results for input(s): TROPONINIHS in the last 720 hours.    Chemistry Recent Labs  Lab 04/11/21 0809 04/15/21 0207 04/16/21 0149  NA 135 135 136  K 4.1 3.2* 3.2*  CL 104 96* 96*  CO2 22 29 31   GLUCOSE 210* 127* 85  BUN 19 16 14   CREATININE 1.13 1.39* 1.28*  CALCIUM 8.4* 8.5* 8.7*  GFRNONAA >60 59* >60  ANIONGAP 9 10 9      Hematology Recent Labs  Lab 04/10/21 0046 04/14/21 0137  WBC 13.1* 11.2*  RBC 3.62* 3.54*  HGB 11.3* 10.8*  HCT 33.0* 32.3*  MCV 91.2 91.2  MCH 31.2 30.5  MCHC 34.2 33.4  RDW 14.3 14.1  PLT 288 438*    BNPNo results for input(s): BNP, PROBNP in the last 168 hours.   DDimer No results for input(s): DDIMER in the last 168 hours.   Radiology    No results found.   Patient Profile     55 y.o. male with known multivessel coronary artery disease presents with acute coronary syndrome and is found to have severe progressive multivessel disease treated with CABG  Assessment & Plan    1.  Three-vessel coronary artery disease status post CABG: Clinically stable on current medical therapy, plan discharge home today.  Medications reviewed. 2.  Persistent postoperative atrial fibrillation: On oral amiodarone at present, has been in and out of atrial fibrillation.  Anticoagulated with apixaban.  Will arrange cardiology follow-up in 1 to 2 weeks.  Discussed the possibility of cardioversion if he remains in atrial fibrillation.  Hopefully he will convert spontaneously on amiodarone as he gets further out from cardiac  surgery. 3.  Postoperative volume overload: Managed by surgical team, plans for discharge on furosemide 40 mg daily.  Has received daily metolazone.  Would discharge on furosemide alone.  [facility] will sign off.   Medication Recommendations:  DC metolazone, continue other medications Other recommendations (labs, testing, etc):  none Follow up as an outpatient:  Dr Tomie China - will arrange  For questions or updates, please contact [facility] Please consult [email or web address] for contact info under        Signed, Tonny Bollman, MD  04/16/2021, 9:11 AM

## 2021-04-16 NOTE — Progress Notes (Signed)
[address]       [place] [address]             [phone]      11 Days Post-Op Procedure(s) (LRB): CORONARY ARTERY BYPASS GRAFTING (CABG) X 4, USING LEFT INTERNAL MAMMARY ARTERY, LEFT RADIAL ARTERY, AND RIGHT LEG GREATER SAPHENOUS VEIN HARVESTED ENDOSCOPICALLY (N/A) RADIAL ARTERY HARVEST (Left) TRANSESOPHAGEAL ECHOCARDIOGRAM (TEE) (N/A) Subjective: Feels good this morning,walked several times yesterday. Having expected soreness in his sternum but denies pain.   Objective: Vital signs in last 24 hours: Temp:  [97.8 F (36.6 C)-98.5 F (36.9 C)] 98.1 F (36.7 C) (08/22 0440) Pulse Rate:  [75-98] 76 (08/22 0440) Cardiac Rhythm: Atrial fibrillation (08/22 0040) Resp:  [18-20] 20 (08/22 0440) BP: (99-159)/(64-80) 126/72 (08/22 0440) SpO2:  [94 %-97 %] 96 % (08/22 0440) Weight:  [165.3 kg] 165.3 kg (08/22 0440)     Intake/Output from previous day: 08/21 0701 - 08/22 0700 In: 480 [P.O.:480] Out: 2901 [Urine:2900; Stool:1] Intake/Output this shift: No intake/output data recorded.  General appearance: alert, cooperative, and no distress Heart: irregularly irregular rhythm, monitor showing rate-controlled a-fib.  Lungs: clear to auscultation bilaterally Abdomen: soft, non-tender; bowel sounds normal; no masses,  no organomegaly Extremities: 1-2+ pitting edema. Left hand well perfused, no m/s deficits.  Wound: Sternal dressing changed. No drainage overnight. The left radial artery harvest incision and right LE EVH incision are both intact and dry.   Lab Results: Recent Labs    04/14/21 0137  WBC 11.2*  HGB 10.8*  HCT 32.3*  PLT 438*    BMET:  Recent Labs    04/15/21 0207 04/16/21 0149  NA 135 136  K 3.2* 3.2*  CL 96* 96*  CO2 29 31  GLUCOSE 127* 85  BUN 16 14  CREATININE 1.39* 1.28*  CALCIUM 8.5* 8.7*     PT/INR: No results for input(s): LABPROT, INR in the last 72 hours. ABG    Component Value Date/Time   PHART 7.370 04/06/2021  0002   HCO3 19.0 (L) 04/06/2021 0002   TCO2 20 (L) 04/06/2021 0002   ACIDBASEDEF 6.0 (H) 04/06/2021 0002   O2SAT 94.0 04/06/2021 0002   CBG (last 3)  Recent Labs    04/15/21 1718 04/15/21 2104 04/16/21 0532  GLUCAP 112* 117* 74     Assessment/Plan: S/P Procedure(s) (LRB): CORONARY ARTERY BYPASS GRAFTING (CABG) X 4, USING LEFT INTERNAL MAMMARY ARTERY, LEFT RADIAL ARTERY, AND RIGHT LEG GREATER SAPHENOUS VEIN HARVESTED ENDOSCOPICALLY (N/A) RADIAL ARTERY HARVEST (Left) TRANSESOPHAGEAL ECHOCARDIOGRAM (TEE) (N/A)  1. CV - PAF. Remains in rate-controlled Afib this morning . On Amiodarone 400 mg bid, Lopressor 50 mg bid, Apixaban 5 mg bid, and Amlodipine 10 mg daily for radial artery harvest.  2.  Pulmonary - On room air, excellent oxygen saturation.  3. Volume Overload - On Lasix 40 mg daily Weight now abouKg below pre-op if accurate but still has 1-2+ LE pitting edema. Will continue daily Lasix, correct K+. 4.  Expected post op acute blood loss anemia - Last H and H stable at 10.8/32.3 5. DM-CBGs show acceptable control . On Acotplus Met and Insulin prior to surgery.  Will restart Actoplus at discharge. Pre op HGA1C 6.5 6. Creatinine down to 1.28 7. Sternal drainage-this has tapered off, none overnight. . No erythema and no instability. This is likely from fat necrosis. Continue Keflex TID.    Plan: Will discharge to home today. To continue oral Keflex and oral Lasix along with. Amiodarone,  Eliquis, metoprolol, ASA, Crestor and DM medications as he was taking prior to discharge. F/U with Dr. Kipp Brood this Friday with virtual visit.    LOS: 14 days    Antony Odea, [place] [phone] 04/16/2021

## 2021-04-18 ENCOUNTER — Telehealth: Payer: Self-pay | Admitting: *Deleted

## 2021-04-18 NOTE — Telephone Encounter (Signed)
Patient contacted the office with c/o redness along left radial artery harvest site. Patient s/p CABG 8/11. Patient states he went to the pharmacy today and had the pharmacist, whom he states he knows well, look at his arm who then instructed him to give [place] a call. Per patient, he has redness running along his left radial artery harvest site. He denies fever, warmth, swelling, pain or drainage from incision. Patient sent photo into office cell phone which portrayed the patients description. Advised patient to continue to monitor the incision and report any changes to [place]. Advised to continue to keep incision clean and dry with soap and water. Patient verbalizes understanding.

## 2021-04-19 ENCOUNTER — Telehealth (HOSPITAL_COMMUNITY): Payer: Self-pay

## 2021-04-19 ENCOUNTER — Telehealth: Payer: Self-pay | Admitting: Cardiology

## 2021-04-19 NOTE — Telephone Encounter (Signed)
Per phase I cardiac rehab, fax cardiac rehab referral to [facility]. °

## 2021-04-19 NOTE — Telephone Encounter (Signed)
Per phase I cardiac rehab, fax cardiac rehab referral to [facility]. °

## 2021-04-19 NOTE — Telephone Encounter (Signed)
  Are you calling in reference to your FMLA or disability form? FMLA and short term disability  What is your question in regards to FMLA or disability form? Steward Drone from the patient's HR states she has not completed the documents yet. She says the patient does not have an appointment until 9/30, and would like to know the best way to get the documents to the office. Phone: [phone]   Do you need copies of your medical records? no  Are you waiting on a nurse to call you back with results or are you wanting copies of your results? no   Please route to Medical Records or your medical records site representative

## 2021-04-20 ENCOUNTER — Other Ambulatory Visit: Payer: Self-pay

## 2021-04-20 ENCOUNTER — Telehealth (INDEPENDENT_AMBULATORY_CARE_PROVIDER_SITE_OTHER): Payer: Self-pay | Admitting: Thoracic Surgery (Cardiothoracic Vascular Surgery)

## 2021-04-20 DIAGNOSIS — Z951 Presence of aortocoronary bypass graft: Secondary | ICD-10-CM

## 2021-04-20 NOTE — Progress Notes (Signed)
     [address]       [name] [address]             [phone]       Patient: Home Provider: Office Consent for Telemedicine visit obtained.  Today's visit was completed via a real-time telehealth (see specific modality noted below). The patient/authorized person provided oral consent at the time of the visit to engage in a telemedicine encounter with the present provider at [facility]. The patient/authorized person was informed of the potential benefits, limitations, and risks of telemedicine. The patient/authorized person expressed understanding that the laws that protect confidentiality also apply to telemedicine. The patient/authorized person acknowledged understanding that telemedicine does not provide emergency services and that he or she would need to call 911 or proceed to the nearest hospital for help if such a need arose.   Total time spent in the clinical discussion 10 minutes.  Telehealth Modality: Phone visit (audio only)  I had a telephone visit with Mr. Thornsberry.  He is s/p CABG.  Doing better with his pain.  Denies any discharge for sternal incision.  He has had some problem with orthostasis.   I recommended that he increase his fluid intake, and check his blood pressure twice daily.  He will f/u in 1 month with a CXR  Kirk Sampley O [place]

## 2021-04-24 ENCOUNTER — Other Ambulatory Visit (HOSPITAL_COMMUNITY): Payer: Self-pay

## 2021-04-24 ENCOUNTER — Telehealth (HOSPITAL_COMMUNITY): Payer: Self-pay | Admitting: Pharmacist

## 2021-04-24 NOTE — Telephone Encounter (Signed)
Pharmacy Transitions of Care Follow-up Telephone Call   Date of discharge: 04/16/21  Discharge Diagnosis: Post-op PAF   How have you been since you were released from the hospital? Doing ok    Medication changes made at discharge - START: amiodarone (PACERONE)      cephALEXin (KEFLEX)      Eliquis (apixaban)      furosemide (LASIX)      metoprolol tartrate (LOPRESSOR)      potassium chloride SA (KLOR-CON)      traMADol (ULTRAM)  -STOP taking:      carvedilol 25 MG tablet (COREG)        hydrALAZINE 25 MG tablet (APRESOLINE)        nitroGLYCERIN 0.4 MG SL tablet (NITROSTAT)        valsartan 320 MG tablet (DIOVAN)        Medication changes verified by the patient?  Y     Medication Accessibility:   [facility] [address] [facility] [place], [place]   Was the patient provided with refills on discharged medications? Y    Have all prescriptions been transferred from [facility] to home pharmacy?  Y   Is the patient able to afford medications? Y Notable copays: $22.48/30 day supply     Medication Review:   APIXABAN (ELIQUIS)  Apixaban 5mg  BID - Discussed importance of taking medication around the same time everyday  - Reviewed potential DDIs with patient  - Advised patient of medications to avoid (NSAIDs, ASA)  - Educated that Tylenol (acetaminophen) will be the preferred analgesic to prevent risk of bleeding  - Emphasized importance of monitoring for signs and symptoms of bleeding (abnormal bruising, prolonged bleeding, nose bleeds, bleeding from gums, discolored urine, black tarry stools)  - Advised patient to alert all providers of anticoagulation therapy prior to starting a new medication or having a procedure      Follow-up Appointments:   [facility] f/u appt confirmed?  Scheduled to see Revankar, on 9/20 @ 1pm.    If their condition worsens, is the pt aware to call PCP or go to the Emergency Dept.? Y   Final Patient Assessment:  -Pt is doing fine -Pt  verbalized understanding of Eliquis.  -Pt has post discharge appointment [facility] -No refill for Amiodarone so informed pt to get a new script on his office visit with cardiologist.  10/20, PharmD

## 2021-05-11 ENCOUNTER — Telehealth: Payer: Self-pay

## 2021-05-11 NOTE — Telephone Encounter (Signed)
FMLA/STD forms completed and faxed to [employer] @ [phone] Beginning leave 04/02/21 through 07/02/21

## 2021-05-15 ENCOUNTER — Encounter: Payer: Self-pay | Admitting: Cardiology

## 2021-05-15 ENCOUNTER — Other Ambulatory Visit: Payer: Self-pay

## 2021-05-15 ENCOUNTER — Ambulatory Visit: Payer: 59 | Admitting: Cardiology

## 2021-05-15 VITALS — BP 128/78 | HR 79 | Ht 75.0 in | Wt 360.0 lb

## 2021-05-15 DIAGNOSIS — E088 Diabetes mellitus due to underlying condition with unspecified complications: Secondary | ICD-10-CM

## 2021-05-15 DIAGNOSIS — I25110 Atherosclerotic heart disease of native coronary artery with unstable angina pectoris: Secondary | ICD-10-CM

## 2021-05-15 DIAGNOSIS — I10 Essential (primary) hypertension: Secondary | ICD-10-CM

## 2021-05-15 DIAGNOSIS — Z951 Presence of aortocoronary bypass graft: Secondary | ICD-10-CM

## 2021-05-15 DIAGNOSIS — E1169 Type 2 diabetes mellitus with other specified complication: Secondary | ICD-10-CM

## 2021-05-15 DIAGNOSIS — Z6841 Body Mass Index (BMI) 40.0 and over, adult: Secondary | ICD-10-CM

## 2021-05-15 DIAGNOSIS — E785 Hyperlipidemia, unspecified: Secondary | ICD-10-CM

## 2021-05-15 MED ORDER — AMIODARONE HCL 200 MG PO TABS: 200.0000 mg | ORAL_TABLET | Freq: Every day | ORAL | 3 refills | Status: DC

## 2021-05-15 NOTE — Patient Instructions (Signed)
Medication Instructions:  Your physician has recommended you make the following change in your medication:   Decrease your Amiodarone to 200 mg daily.  *If you need a refill on your cardiac medications before your next appointment, please call your pharmacy*   Lab Work: Your physician recommends that you have labs done in the office today. Your test included  basic metabolic panel and liver function.  If you have labs (blood work) drawn today and your tests are completely normal, you will receive your results only by: MyChart Message (if you have MyChart) OR A paper copy in the mail If you have any lab test that is abnormal or we need to change your treatment, we will call you to review the results.   Testing/Procedures: None ordered   Follow-Up: At [facility], you and your health needs are our priority.  As part of our continuing mission to provide you with exceptional heart care, we have created designated Provider Care Teams.  These Care Teams include your primary Cardiologist (physician) and Advanced Practice Providers (APPs -  Physician Assistants and Nurse Practitioners) who all work together to provide you with the care you need, when you need it.  We recommend signing up for the patient portal called "MyChart".  Sign up information is provided on this After Visit Summary.  MyChart is used to connect with patients for Virtual Visits (Telemedicine).  Patients are able to view lab/test results, encounter notes, upcoming appointments, etc.  Non-urgent messages can be sent to your provider as well.   To learn more about what you can do with MyChart, go to [email or web address].    Your next appointment:   1 month(s)  The format for your next appointment:   In Person  Provider:   Belva Crome, MD   Other Instructions NA

## 2021-05-15 NOTE — Progress Notes (Signed)
Cardiology Office Note:    Date:  05/15/2021   ID:  Kevin Lawrence, DOB 01/31/1966, MRN [id number]  PCP:  Helen Hashimoto., MD  Cardiologist:  Jenean Lindau, MD   Referring MD: Helen Hashimoto., MD    ASSESSMENT:    1. Coronary artery disease involving native coronary artery of native heart with unstable angina pectoris ([place])   2. Essential hypertension   3. Diabetes mellitus due to underlying condition with unspecified complications ([place])   4. Hyperlipidemia associated with type 2 diabetes mellitus ([place])   5. Morbid obesity with BMI of 45.0-49.9, adult ([place])   6. S/P CABG x 4    PLAN:    In order of problems listed above:  Coronary artery disease post CABG surgery: Secondary prevention stressed with the patient.  Importance of compliance with diet medication stressed any vocalized understanding.  I congratulated him on joining cardiac rehab and going and attending it on a regular basis.  He promises to be compliant with diet and exercise.  He plans to lose weight by aggressively doing this. Essential hypertension: Blood pressure stable and diet was emphasized.  Lifestyle modification urged. Diabetes mellitus and obesity: I had an extensive discussion with him and he promises to do better with this. He has an appointment coming up with the surgeon next week please Postoperative atrial fibrillation: Patient on amiodarone and anticoagulation.  I cut down his amiodarone 200 mg daily he will have a Chem-7 and liver panel today.  Follow-up appointment in a month or earlier if he has any concerns.  Patient had multiple questions which were answered to her satisfaction.   Medication Adjustments/Labs and Tests Ordered: Current medicines are reviewed at length with the patient today.  Concerns regarding medicines are outlined above.  No orders of the defined types were placed in this encounter.  No orders of the defined types were placed in this encounter.    No chief  complaint on file.    History of Present Illness:    Kevin Lawrence is a 55 y.o. male.  Patient has past medical history of essential hypertension diabetes mellitus, mixed dyslipidemia and morbid obesity.  He went to [facility] after he was transferred from [facility].  I reviewed those records.  He underwent coronary angiography and had multivessel disease and underwent coronary artery bypass surgery.  He denies any problems subsequently.  No chest pain orthopnea or PND.  At the time of my evaluation, the patient is alert awake oriented and in no distress.  He is walking on a regular basis.  Past Medical History:  Diagnosis Date   Acute coronary syndrome ([place]) 04/02/2021   Capsulitis of metatarsophalangeal (MTP) joint of right foot 06/11/2016   Chest pain 04/02/2021   Contusion of right heel 06/09/2017   Coronary artery disease involving native coronary artery of native heart with unstable angina pectoris ([place]) 03/10/2015   Cath 2016:  100% mid Circumflex 90% Om ---> 0% with stent Mid RCA restenosis treated with Angiosculpt Moderate mid LAD   Diabetes mellitus due to underlying condition with unspecified complications ([place]) 38/46/6599   Dizzy spells    Dyslipidemia 03/10/2015   Essential hypertension 03/10/2015   Foot pain, bilateral 09/29/2020   Hyperlipidemia associated with type 2 diabetes mellitus ([place]) 03/10/2015   Ingrown nail 07/28/2018   Injury of toenail of left foot 06/21/2016   Loose toenail 06/21/2016   Morbid obesity with BMI of 45.0-49.9, adult ([place]) 03/10/2015   Nondisplaced fracture of  body of right calcaneus, initial encounter for closed fracture 06/09/2017   OSA on CPAP 02/24/2017   Plantar callus 05/13/2019   Right foot pain 06/09/2017   S/P CABG x 4 04/05/2021   SOB (shortness of breath) 10/01/2017   SVT (supraventricular tachycardia) ([place]) 02/21/2017   Uncontrolled type 2 diabetes mellitus without complication, with long-term current use of insulin  06/11/2016    Past Surgical History:  Procedure Laterality Date   CORONARY ARTERY BYPASS GRAFT N/A 04/05/2021   Procedure: CORONARY ARTERY BYPASS GRAFTING (CABG) X 4, USING LEFT INTERNAL MAMMARY ARTERY, LEFT RADIAL ARTERY, AND RIGHT LEG GREATER SAPHENOUS VEIN HARVESTED ENDOSCOPICALLY;  Surgeon: Lajuana Matte, MD;  Location: [place];  Service: Open Heart Surgery;  Laterality: N/A;   INTRAVASCULAR PRESSURE WIRE/FFR STUDY N/A 04/02/2021   Procedure: INTRAVASCULAR PRESSURE WIRE/FFR STUDY;  Surgeon: Leonie Man, MD;  Location: [place] [facility];  Service: Cardiovascular;  Laterality: N/A;   LEFT HEART CATH AND CORONARY ANGIOGRAPHY N/A 10/06/2017   Procedure: LEFT HEART CATH AND CORONARY ANGIOGRAPHY;  Surgeon: Belva Crome, MD;  Location: [place] [facility];  Service: Cardiovascular;  Laterality: N/A;   LEFT HEART CATH AND CORONARY ANGIOGRAPHY N/A 04/02/2021   Procedure: LEFT HEART CATH AND CORONARY ANGIOGRAPHY;  Surgeon: Leonie Man, MD;  Location: [place] [facility];  Service: Cardiovascular;  Laterality: N/A;   RADIAL ARTERY HARVEST Left 04/05/2021   Procedure: RADIAL ARTERY HARVEST;  Surgeon: Lajuana Matte, MD;  Location: [place];  Service: Open Heart Surgery;  Laterality: Left;   TEE WITHOUT CARDIOVERSION N/A 04/05/2021   Procedure: TRANSESOPHAGEAL ECHOCARDIOGRAM (TEE);  Surgeon: Lajuana Matte, MD;  Location: [place];  Service: Open Heart Surgery;  Laterality: N/A;    Current Medications: Current Meds  Medication Sig   amiodarone (PACERONE) 200 MG tablet Take 1 tablet (200 mg total) by mouth 2 (two) times daily.   amLODipine (NORVASC) 10 MG tablet Take 10 mg by mouth daily.    apixaban (ELIQUIS) 5 MG TABS tablet Take 1 tablet (5 mg total) by mouth 2 (two) times daily.   aspirin EC 81 MG tablet Take 81 mg by mouth daily.    budesonide-formoterol (SYMBICORT) 160-4.5 MCG/ACT inhaler Inhale 2 puffs into the lungs daily as needed (asthma).   colesevelam (WELCHOL) 625 MG  tablet Take 1,875 mg by mouth 2 (two) times daily with a meal.    famotidine (PEPCID) 20 MG tablet Take 20 mg by mouth daily as needed for heartburn.   liraglutide (VICTOZA) 18 MG/3ML SOPN Inject 1.8 mg into the skin daily.   metoprolol tartrate (LOPRESSOR) 50 MG tablet Take 1 tablet (50 mg total) by mouth 2 (two) times daily.   pioglitazone-metformin (ACTOPLUS MET) 15-850 MG tablet Take 1 tablet by mouth 2 (two) times daily with a meal.    rosuvastatin (CRESTOR) 40 MG tablet Take 40 mg by mouth daily.   TOUJEO SOLOSTAR 300 UNIT/ML SOPN Inject 80 Units into the skin daily.   traMADol (ULTRAM) 50 MG tablet Take 1-2 tablets (50-100 mg total) by mouth every 6 (six) hours as needed for moderate pain.     Allergies:   Codeine and Hydrocodone-acetaminophen   Social History   Socioeconomic History   Marital status: Single    Spouse name: Not on file   Number of children: Not on file   Years of education: Not on file   Highest education level: Not on file  Occupational History   Not on file  Tobacco Use  Smoking status: Former   Smokeless tobacco: Former  Substance and Sexual Activity   Alcohol use: Yes    Alcohol/week: 1.0 standard drink    Types: 1 Standard drinks or equivalent per week   Drug use: Not on file   Sexual activity: Not on file  Other Topics Concern   Not on file  Social History Narrative   Not on file   Social Determinants of Health   Financial Resource Strain: Not on file  Food Insecurity: Not on file  Transportation Needs: Not on file  Physical Activity: Not on file  Stress: Not on file  Social Connections: Not on file     Family History: The patient's family history includes Cancer in his mother; Heart disease in his father; Stroke in his mother.  ROS:   Please see the history of present illness.    All other systems reviewed and are negative.  EKGs/Labs/Other Studies Reviewed:    The following studies were reviewed today: INTRAVASCULAR PRESSURE  WIRE/FFR STUDY  LEFT HEART CATH AND CORONARY ANGIOGRAPHY   Conclusion      Ost LAD lesion is 50% stenosed. followed by Prox LAD lesion is 70% stenosed. - RFR POSITIVE (0.86)   1st LPL lesion is 100% stenosed. (Known to be occluded - on previous report noted as Mid LCx   Prox Cx to Mid Cx(-2nd Mrg) lesion is 95% stenosed just priot to stent.   Prox RCA-1 lesion is 90% stenosed just priot to long stented segment.   Prox RCA-2 Stent is 20% stenosed, followed by Mid RCA lesion is 70% stenosis   The left ventricular systolic function is normal.  The left ventricular ejection fraction is 50-55% by visual estimate.   LV end diastolic pressure is normal.   SUMMARY Severe Three-Vessel CAD: Tandem 50 and 70% focal stenosis in the proximal LAD (RFR positive at 0.86) Mid LCx (2nd Mrg) 95% focal stenosis at the proximal edge of previously placed stent; this is just after the takeoff of the AV groove branch with known occlusion of LPL branch. Proximal RCA 90% stenosis just prior to stent with 70% ISR in the mid portion of the RCA stent Normal LVEDP, preserved LVEF     RECOMMENDATIONS With three-vessel disease 2 of which involve in-stent restenosis/proximal and stenosis as well as now RFR positive LAD disease, recommend CABG. Will admit to inpatient for stabilization and CVTS consultation. Restart IV heparin 8 hours after sheath removal Continue aggressive GM DT medical management for CAD/ACS   Recent Labs: 04/03/2021: ALT 27 04/06/2021: Magnesium 2.0 04/14/2021: Hemoglobin 10.8; Platelets 438 04/16/2021: BUN 14; Creatinine, Ser 1.28; Potassium 3.2; Sodium 136  Recent Lipid Panel No results found for: CHOL, TRIG, HDL, CHOLHDL, VLDL, LDLCALC, LDLDIRECT  Physical Exam:    VS:  BP 128/78   Pulse 79   Ht '6\' 3"'  (1.905 m)   Wt (!) 360 lb (163.3 kg)   SpO2 96%   BMI 45.00 kg/m     Wt Readings from Last 3 Encounters:  05/15/21 (!) 360 lb (163.3 kg)  04/16/21 (!) 364 lb 6.7 oz (165.3 kg)   02/15/21 (!) 373 lb 12.8 oz (169.6 kg)     GEN: Patient is in no acute distress HEENT: Normal NECK: No JVD; No carotid bruits LYMPHATICS: No lymphadenopathy CARDIAC: Hear sounds regular, 2/6 systolic murmur at the apex. RESPIRATORY:  Clear to auscultation without rales, wheezing or rhonchi  ABDOMEN: Soft, non-tender, non-distended MUSCULOSKELETAL:  No edema; No deformity  SKIN: Warm and dry NEUROLOGIC:  Alert and oriented x 3 PSYCHIATRIC:  Normal affect   Signed, Jenean Lindau, MD  05/15/2021 1:13 PM    [facility]

## 2021-05-16 ENCOUNTER — Other Ambulatory Visit: Payer: Self-pay | Admitting: Thoracic Surgery (Cardiothoracic Vascular Surgery)

## 2021-05-16 DIAGNOSIS — Z951 Presence of aortocoronary bypass graft: Secondary | ICD-10-CM

## 2021-05-16 LAB — HEPATIC FUNCTION PANEL
ALT: 18 IU/L (ref 0–44)
AST: 21 IU/L (ref 0–40)
Albumin: 4.3 g/dL (ref 3.8–4.9)
Alkaline Phosphatase: 72 IU/L (ref 44–121)
Bilirubin Total: 0.6 mg/dL (ref 0.0–1.2)
Bilirubin, Direct: 0.16 mg/dL (ref 0.00–0.40)
Total Protein: 6.6 g/dL (ref 6.0–8.5)

## 2021-05-16 LAB — BASIC METABOLIC PANEL
BUN/Creatinine Ratio: 18 (ref 9–20)
BUN: 19 mg/dL (ref 6–24)
CO2: 22 mmol/L (ref 20–29)
Calcium: 9.1 mg/dL (ref 8.7–10.2)
Chloride: 101 mmol/L (ref 96–106)
Creatinine, Ser: 1.07 mg/dL (ref 0.76–1.27)
Glucose: 73 mg/dL (ref 65–99)
Potassium: 4 mmol/L (ref 3.5–5.2)
Sodium: 140 mmol/L (ref 134–144)
eGFR: 81 mL/min/{1.73_m2} (ref >59)

## 2021-05-17 ENCOUNTER — Other Ambulatory Visit: Payer: Self-pay

## 2021-05-17 ENCOUNTER — Ambulatory Visit (INDEPENDENT_AMBULATORY_CARE_PROVIDER_SITE_OTHER): Payer: Self-pay | Admitting: Physician Assistant

## 2021-05-17 ENCOUNTER — Other Ambulatory Visit: Payer: Self-pay | Admitting: Cardiology

## 2021-05-17 ENCOUNTER — Ambulatory Visit
Admission: RE | Admit: 2021-05-17 | Discharge: 2021-05-17 | Disposition: A | Discharge status: Home / Self Care | Payer: 59 | Source: Ambulatory Visit | Attending: Thoracic Surgery (Cardiothoracic Vascular Surgery) | Admitting: Thoracic Surgery (Cardiothoracic Vascular Surgery)

## 2021-05-17 VITALS — BP 117/73 | HR 82 | Resp 20 | Ht 75.0 in | Wt 362.0 lb

## 2021-05-17 DIAGNOSIS — Z951 Presence of aortocoronary bypass graft: Secondary | ICD-10-CM

## 2021-05-17 NOTE — Progress Notes (Signed)
[address]       [place] [address]             [phone]       Kevin Lawrence is a 55 y.o. male patient s/p CABG x 4 with radial artery and right leg greater saphenous vein harvest endoscopically with Dr. Kipp Brood on 04/05/2021 . He did not have any significant complications in the hospital. He is a diabetic and on multiple medications at home which were restarted on discharge.   Since discharge he has multiple complaints. He had a new onset bilateral upper ext tremor. He is having right sided leg pain around the vein tunnel but also down the right lower leg. He is experiencing lower ext edema. He seems most concerned about the tremor.    No diagnosis found. Past Medical History:  Diagnosis Date   Acute coronary syndrome ([place]) 04/02/2021   Capsulitis of metatarsophalangeal (MTP) joint of right foot 06/11/2016   Chest pain 04/02/2021   Contusion of right heel 06/09/2017   Coronary artery disease involving native coronary artery of native heart with unstable angina pectoris ([place]) 03/10/2015   Cath 2016:  100% mid Circumflex 90% Om ---> 0% with stent Mid RCA restenosis treated with Angiosculpt Moderate mid LAD   Diabetes mellitus due to underlying condition with unspecified complications ([place]) 84/16/6063   Dizzy spells    Dyslipidemia 03/10/2015   Essential hypertension 03/10/2015   Foot pain, bilateral 09/29/2020   Hyperlipidemia associated with type 2 diabetes mellitus ([place]) 03/10/2015   Ingrown nail 07/28/2018   Injury of toenail of left foot 06/21/2016   Loose toenail 06/21/2016   Morbid obesity with BMI of 45.0-49.9, adult ([place]) 03/10/2015   Nondisplaced fracture of body of right calcaneus, initial encounter for closed fracture 06/09/2017   OSA on CPAP 02/24/2017   Plantar callus 05/13/2019   Right foot pain 06/09/2017   S/P CABG x 4 04/05/2021   SOB (shortness of breath) 10/01/2017   SVT (supraventricular tachycardia) ([place]) 02/21/2017   Uncontrolled  type 2 diabetes mellitus without complication, with long-term current use of insulin 06/11/2016   No past surgical history pertinent negatives on file. Scheduled Meds: Current Outpatient Medications on File Prior to Visit  Medication Sig Dispense Refill   amiodarone (PACERONE) 200 MG tablet Take 1 tablet (200 mg total) by mouth daily. 90 tablet 3   amLODipine (NORVASC) 10 MG tablet Take 10 mg by mouth daily.   4   apixaban (ELIQUIS) 5 MG TABS tablet Take 1 tablet (5 mg total) by mouth 2 (two) times daily. 60 tablet 1   aspirin EC 81 MG tablet Take 81 mg by mouth daily.      budesonide-formoterol (SYMBICORT) 160-4.5 MCG/ACT inhaler Inhale 2 puffs into the lungs daily as needed (asthma).     colesevelam (WELCHOL) 625 MG tablet Take 1,875 mg by mouth 2 (two) times daily with a meal.      famotidine (PEPCID) 20 MG tablet Take 20 mg by mouth daily as needed for heartburn.     liraglutide (VICTOZA) 18 MG/3ML SOPN Inject 1.8 mg into the skin daily.     metoprolol tartrate (LOPRESSOR) 50 MG tablet Take 1 tablet (50 mg total) by mouth 2 (two) times daily. 60 tablet 1   pioglitazone-metformin (ACTOPLUS MET) 15-850 MG tablet Take 1 tablet by mouth 2 (two) times daily with a meal.      rosuvastatin (CRESTOR) 40 MG tablet Take 40 mg by mouth daily.  3   TOUJEO SOLOSTAR 300 UNIT/ML SOPN Inject 80 Units into the skin daily.  3   traMADol (ULTRAM) 50 MG tablet Take 1-2 tablets (50-100 mg total) by mouth every 6 (six) hours as needed for moderate pain. 30 tablet 0   No current facility-administered medications on file prior to visit.     Allergies  Allergen Reactions   Codeine Shortness Of Breath    "I turn bright red and have difficulty breathing"   Hydrocodone-Acetaminophen Shortness Of Breath    "I turn red and have difficulty breathing"   Vitals:   05/17/21 1419  BP: 117/73  Pulse: 82  Resp: 20  SpO2: 94%    Cor: RRR, no murmur Pulm: CTA bilaterally and in all fields Abd: no  tenderness Ext: 2-3+ pitting edema Wound: healing well with tiny blister in the center. Radial site healing well.   Objective: Vital signs (most recent): There were no vitals taken for this visit.   CLINICAL DATA:  Status post CABG   EXAM: CHEST - 2 VIEW   COMPARISON:  Chest x-ray dated April 11, 2021   FINDINGS: Cardiac and mediastinal contours are unchanged status post median sternotomy and CABG. Mild bibasilar atelectasis. Lungs otherwise clear. No pleural effusion or pneumothorax.   IMPRESSION: Bibasilar atelectasis.  Lungs otherwise clear.     Electronically Signed   By: Yetta Glassman M.D.   On: 05/17/2021 14:12   Assessment & Plan  Atelectasis-discussed using his IS daily as well as increasing his activity level He is working on weight loss  Tremor- new onset since surgery. It takes place off and on with no real trigger.  Lower ext edema-prescribed lasix post-op but he never took it. Creatinine looks okay. He should start taking the lasix, keep his feet elevated Continue to monitor your blood glucose level closely Please participate in a cardiac rehab program three days a week  Plan: He can begin to increase his activity level. He can drive since he isn't taking any narcotics and never did due to a sensitivity. He will need to follow-up with a surgeon to discuss the tremor if still present in 2-3 weeks.   Kevin Lawrence 05/17/2021

## 2021-05-17 NOTE — Telephone Encounter (Signed)
Prescription refill request for Eliquis received. Last office visit:revankar 05/15/21 Scr:1.07 05/15/21 Age: 52m Weight:164.2kg

## 2021-05-18 ENCOUNTER — Ambulatory Visit: Payer: 59 | Admitting: Thoracic Surgery (Cardiothoracic Vascular Surgery)

## 2021-05-28 ENCOUNTER — Ambulatory Visit: Payer: PRIVATE HEALTH INSURANCE | Admitting: Cardiology

## 2021-05-28 ENCOUNTER — Ambulatory Visit (INDEPENDENT_AMBULATORY_CARE_PROVIDER_SITE_OTHER): Payer: Self-pay | Admitting: *Deleted

## 2021-05-28 ENCOUNTER — Other Ambulatory Visit: Payer: Self-pay

## 2021-05-28 DIAGNOSIS — Z951 Presence of aortocoronary bypass graft: Secondary | ICD-10-CM

## 2021-05-28 DIAGNOSIS — Z5189 Encounter for other specified aftercare: Secondary | ICD-10-CM

## 2021-05-28 DIAGNOSIS — Z4889 Encounter for other specified surgical aftercare: Secondary | ICD-10-CM

## 2021-05-28 MED ORDER — CEPHALEXIN 500 MG PO CAPS: 500.0000 mg | ORAL_CAPSULE | Freq: Two times a day (BID) | ORAL | 0 refills | Status: DC

## 2021-05-28 NOTE — Progress Notes (Signed)
Patient contacted the office stating he noticed a small area at the distal end of his left radial artery harvest site that was painful and looked like a stitch coming through. Patient states he was attending a cardiac rehab session and the nurse told him it looked like a stitch. Patient states the area is red around the stitch and warm to touch. Patient denies fever. Patient states it is painful to touch. Patient arrived for nurse visit to assess site. Distal incision of left forearm showed a small white head. Patient winced when area was touched. No distinct redness around area. Margaretha Glassing, PA, assessed wound, cleansed area with betadine and clipped small suture protruding from incision. Site covered with gauze and tape. Patient sent in antibiotic prescription to preferred pharmacy. Patient verbalizes understanding.

## 2021-05-28 NOTE — Progress Notes (Signed)
      [address]       [name] [address]             [phone]        Kevin Lawrence presented for a wound check for his radial artery harvest site. He was in the clinic for a routine office visit on 9/22 and was doing well at that time.   Wound: Radial artery harvest site healing well. There is a small area of tenderness, erythema, with a white head forming towards the distal end of his incision. His wound was cleaned and a tiny suture tail was removed. The area was dressed with 2 x 2 dressings and tape. He was instructed to keep the incision clean and dry.   He has experienced some chills but this could be due to the change in weather and his house temperature.   Plan: Keep clean. Keflex 500mg  BID x 5 days. Follow-up as need for wound issues.   , PA-C

## 2021-06-01 ENCOUNTER — Other Ambulatory Visit: Payer: Self-pay | Admitting: Physician Assistant

## 2021-06-07 ENCOUNTER — Other Ambulatory Visit: Payer: Self-pay | Admitting: Cardiology

## 2021-06-07 ENCOUNTER — Other Ambulatory Visit: Payer: Self-pay | Admitting: Thoracic Surgery (Cardiothoracic Vascular Surgery)

## 2021-06-07 DIAGNOSIS — Z951 Presence of aortocoronary bypass graft: Secondary | ICD-10-CM

## 2021-06-08 ENCOUNTER — Ambulatory Visit
Admission: RE | Admit: 2021-06-08 | Discharge: 2021-06-08 | Disposition: A | Discharge status: Home / Self Care | Payer: 59 | Source: Ambulatory Visit | Attending: Thoracic Surgery (Cardiothoracic Vascular Surgery) | Admitting: Thoracic Surgery (Cardiothoracic Vascular Surgery)

## 2021-06-08 ENCOUNTER — Encounter: Payer: 59 | Admitting: Thoracic Surgery (Cardiothoracic Vascular Surgery)

## 2021-06-08 ENCOUNTER — Ambulatory Visit (INDEPENDENT_AMBULATORY_CARE_PROVIDER_SITE_OTHER): Payer: Self-pay | Admitting: Physician Assistant

## 2021-06-08 ENCOUNTER — Other Ambulatory Visit: Payer: Self-pay

## 2021-06-08 ENCOUNTER — Encounter: Payer: Self-pay | Admitting: Physician Assistant

## 2021-06-08 VITALS — BP 137/84 | HR 81 | Resp 20 | Ht 75.0 in | Wt 357.0 lb

## 2021-06-08 DIAGNOSIS — Z951 Presence of aortocoronary bypass graft: Secondary | ICD-10-CM

## 2021-06-08 MED ORDER — POTASSIUM CHLORIDE ER 10 MEQ PO TBCR: 10.0000 meq | EXTENDED_RELEASE_TABLET | Freq: Every day | ORAL | 0 refills | Status: DC

## 2021-06-08 MED ORDER — FUROSEMIDE 20 MG PO TABS: 20.0000 mg | ORAL_TABLET | Freq: Every day | ORAL | 0 refills | Status: DC

## 2021-06-08 NOTE — Progress Notes (Addendum)
[address]       [place] [address]             [phone]       Kevin Lawrence is a 55 y.o. male patient s/p CABG x 4 with radial artery and right leg greater saphenous vein harvest endoscopically with Dr. Kipp Brood on 04/05/2021 . He did not have any significant complications in the hospital. He is a diabetic and on multiple medications at home which were restarted on discharge.   Since discharge he has multiple complaints. He had a new onset bilateral upper ext tremor. He is having right sided leg pain around the vein tunnel but also down the right lower leg. He is experiencing lower ext edema. He seems most concerned about the tremor.   Today, he returns to the office for a follow-up visit.  He is still having a new onset bilateral upper extremity tremor that seems to be intermittent.  He does have lower extremity edema which she states is chronic.  He also does have a fungal infection on his left great toenail.   No diagnosis found. Past Medical History:  Diagnosis Date   Acute coronary syndrome ([place]) 04/02/2021   Capsulitis of metatarsophalangeal (MTP) joint of right foot 06/11/2016   Chest pain 04/02/2021   Contusion of right heel 06/09/2017   Coronary artery disease involving native coronary artery of native heart with unstable angina pectoris ([place]) 03/10/2015   Cath 2016:  100% mid Circumflex 90% Om ---> 0% with stent Mid RCA restenosis treated with Angiosculpt Moderate mid LAD   Diabetes mellitus due to underlying condition with unspecified complications ([place]) 65/53/7482   Dizzy spells    Dyslipidemia 03/10/2015   Essential hypertension 03/10/2015   Foot pain, bilateral 09/29/2020   Hyperlipidemia associated with type 2 diabetes mellitus ([place]) 03/10/2015   Ingrown nail 07/28/2018   Injury of toenail of left foot 06/21/2016   Loose toenail 06/21/2016   Morbid obesity with BMI of 45.0-49.9, adult ([place]) 03/10/2015   Nondisplaced fracture of body of right  calcaneus, initial encounter for closed fracture 06/09/2017   OSA on CPAP 02/24/2017   Plantar callus 05/13/2019   Right foot pain 06/09/2017   S/P CABG x 4 04/05/2021   SOB (shortness of breath) 10/01/2017   SVT (supraventricular tachycardia) ([place]) 02/21/2017   Uncontrolled type 2 diabetes mellitus without complication, with long-term current use of insulin 06/11/2016   No past surgical history pertinent negatives on file. Scheduled Meds: Current Outpatient Medications on File Prior to Visit   Current Outpatient Medications on File Prior to Visit  Medication Sig Dispense Refill   amiodarone (PACERONE) 200 MG tablet Take 1 tablet (200 mg total) by mouth daily. 90 tablet 3   amLODipine (NORVASC) 10 MG tablet Take 10 mg by mouth daily.   4   apixaban (ELIQUIS) 5 MG TABS tablet TAKE 1 TABLET BY MOUTH TWICE A DAY 180 tablet 1   aspirin EC 81 MG tablet Take 81 mg by mouth daily.      budesonide-formoterol (SYMBICORT) 160-4.5 MCG/ACT inhaler Inhale 2 puffs into the lungs daily as needed (asthma).     colesevelam (WELCHOL) 625 MG tablet Take 1,875 mg by mouth 2 (two) times daily with a meal.      famotidine (PEPCID) 20 MG tablet Take 20 mg by mouth daily as needed for heartburn.     liraglutide (VICTOZA) 18 MG/3ML SOPN Inject 1.8 mg into the skin daily.  metoprolol tartrate (LOPRESSOR) 50 MG tablet TAKE 1 TABLET BY MOUTH TWICE A DAY 60 tablet 1   pioglitazone-metformin (ACTOPLUS MET) 15-850 MG tablet Take 1 tablet by mouth 2 (two) times daily with a meal.      rosuvastatin (CRESTOR) 40 MG tablet Take 40 mg by mouth daily.  3   TOUJEO SOLOSTAR 300 UNIT/ML SOPN Inject 80 Units into the skin daily.  3   No current facility-administered medications on file prior to visit.       Allergies  Allergen Reactions   Codeine Shortness Of Breath    "I turn bright red and have difficulty breathing"   Hydrocodone-Acetaminophen Shortness Of Breath    "I turn red and have difficulty breathing"    Vitals:   06/08/21 0901  BP: 137/84  Pulse: 81  Resp: 20  SpO2: 95%     Cor: RRR, no murmur Pulm: CTA bilaterally and in all fields Abd: no tenderness Ext: 1-2+ pitting edema Wound: Sternal incision is well-healed.  There is a small suture on the distal end of his radial harvest site which was removed today.  Objective:  CLINICAL DATA:  Status post CABG, felt a pop in right lower chest after a sneeze 1 week ago   EXAM: CHEST - 2 VIEW   COMPARISON:  Chest radiograph 05/17/2021   FINDINGS: Median sternotomy wires and postsurgical changes reflecting CABG are stable.   The cardiomediastinal silhouette is stable.   Linear opacities projecting over the right midlung are unchanged, likely reflecting scarring. There is no new focal consolidation. There is no pulmonary edema. There is no pleural effusion or pneumothorax.   There is no acute osseous abnormality. No displaced rib fracture is seen.   IMPRESSION: 1. Stable chest with no radiographic evidence of acute cardiopulmonary process. 2. No displaced rib fracture identified.     Electronically Signed   By: Valetta Mole M.D.   On: 06/08/2021 08:15  Assessment & Plan  Chest x-ray is much improved with an improvement in atelectasis.  He can discontinue use of his incentive spirometer at this time but continue to ambulate multiple times a day He is working on weight loss  Tremor- new onset since surgery. It takes place off and on with no real trigger.  If this does not resolve he will need to address this with his primary care provider. Lower ext edema-I renewed his prescription for Lasix 20 mg daily as well as a potassium prescription.  He has an appointment in a few weeks with his cardiologist and he will need to have a BMP at that time. Continue to monitor your blood glucose level closely We discussed return to work parameters.  I would prefer him to keep to under 25 pounds of lifting and no climbing.  Most the time  he is able to sit at a desk in front of computers and I think this would be fine to return in about a month.  Plan: Continue to take your Lasix and potassium pills.  Would recommend a BMP at your next cardiology appointment.  Continue daily activity and work on weight loss.  Please return in 1 month for discussion on return to work parameters and timing.  Elgie Collard 06/08/2021

## 2021-06-11 ENCOUNTER — Other Ambulatory Visit: Payer: Self-pay

## 2021-06-22 ENCOUNTER — Other Ambulatory Visit: Payer: Self-pay

## 2021-06-22 ENCOUNTER — Ambulatory Visit: Payer: 59 | Admitting: Cardiology

## 2021-06-22 ENCOUNTER — Encounter: Payer: Self-pay | Admitting: Cardiology

## 2021-06-22 VITALS — BP 142/90 | HR 72 | Ht 75.0 in | Wt 361.6 lb

## 2021-06-22 DIAGNOSIS — E785 Hyperlipidemia, unspecified: Secondary | ICD-10-CM

## 2021-06-22 DIAGNOSIS — E088 Diabetes mellitus due to underlying condition with unspecified complications: Secondary | ICD-10-CM | POA: Diagnosis not present

## 2021-06-22 DIAGNOSIS — I25110 Atherosclerotic heart disease of native coronary artery with unstable angina pectoris: Secondary | ICD-10-CM

## 2021-06-22 DIAGNOSIS — Z951 Presence of aortocoronary bypass graft: Secondary | ICD-10-CM

## 2021-06-22 DIAGNOSIS — I4891 Unspecified atrial fibrillation: Secondary | ICD-10-CM

## 2021-06-22 DIAGNOSIS — I9789 Other postprocedural complications and disorders of the circulatory system, not elsewhere classified: Secondary | ICD-10-CM

## 2021-06-22 DIAGNOSIS — Z9989 Dependence on other enabling machines and devices: Secondary | ICD-10-CM

## 2021-06-22 DIAGNOSIS — G4733 Obstructive sleep apnea (adult) (pediatric): Secondary | ICD-10-CM

## 2021-06-22 HISTORY — DX: Unspecified atrial fibrillation: I48.91

## 2021-06-22 MED ORDER — AMIODARONE HCL 200 MG PO TABS: 100.0000 mg | ORAL_TABLET | Freq: Every day | ORAL | 3 refills | Status: DC

## 2021-06-22 NOTE — Patient Instructions (Addendum)
Medication Instructions:  Your physician has recommended you make the following change in your medication:   Decrease your Amiodarone to 100 mg daily for 1 month then discontinue. Take 1/2 tablet of your current dose.  *If you need a refill on your cardiac medications before your next appointment, please call your pharmacy*   Lab Work: Your physician recommends that you have a BMET done today.  Your physician recommends that you return for lab work in: before your next visit. You need to have labs done when you are fasting.  You can come Monday through Friday 8:30 am to 12:00 pm and 1:15 to 4:30. You do not need to make an appointment as the order has already been placed. The labs you are going to have done are BMET, LFT and Lipids.  If you have labs (blood work) drawn today and your tests are completely normal, you will receive your results only by: MyChart Message (if you have MyChart) OR A paper copy in the mail If you have any lab test that is abnormal or we need to change your treatment, we will call you to review the results.   Testing/Procedures: None ordered   Follow-Up: At [facility], you and your health needs are our priority.  As part of our continuing mission to provide you with exceptional heart care, we have created designated Provider Care Teams.  These Care Teams include your primary Cardiologist (physician) and Advanced Practice Providers (APPs -  Physician Assistants and Nurse Practitioners) who all work together to provide you with the care you need, when you need it.  We recommend signing up for the patient portal called "MyChart".  Sign up information is provided on this After Visit Summary.  MyChart is used to connect with patients for Virtual Visits (Telemedicine).  Patients are able to view lab/test results, encounter notes, upcoming appointments, etc.  Non-urgent messages can be sent to your provider as well.   To learn more about what you can do with MyChart,  go to [email or web address].    Your next appointment:   6 month(s)  The format for your next appointment:   In Person  Provider:   Belva Crome, MD   Other Instructions NA

## 2021-06-22 NOTE — Progress Notes (Signed)
Cardiology Office Note:    Date:  06/22/2021   ID:  Kevin Lawrence, DOB Oct 12, 1965, MRN [id number]  PCP:  Helen Hashimoto., MD  Cardiologist:  Jenean Lindau, MD   Referring MD: Helen Hashimoto., MD    ASSESSMENT:    1. Coronary artery disease involving native coronary artery of native heart with unstable angina pectoris ([place])   2. Diabetes mellitus due to underlying condition with unspecified complications ([place])   3. S/P CABG x 4   4. Dyslipidemia   5. OSA on CPAP   6. Postoperative atrial fibrillation (HCC)    PLAN:    In order of problems listed above:  Coronary artery disease: Secondary prevention stressed with the patient.  Importance of compliance with diet medication stressed any vocalized understanding.  He was advised to walk on a regular basis and he promises to do better. Postoperative atrial fibrillation: He has been mentioned to take lifelong anticoagulation by our surgical colleagues.  I think that is reasonable I gave him the option for this.  Currently I will reduce amiodarone to 100 mg daily and monitor him.  He will stop amiodarone in a month from now.  He will be seen in follow-up appointment in a month or earlier if he has any concerns.  At that time we will do fasting blood work.  Today we will only do a Chem-7. Essential hypertension: Blood pressure stable and diet was emphasized.  Lifestyle modification urged. Diabetes mellitus and morbid obesity: Lifestyle modification urged I told him that he has to be very compliant and focused on getting his blood sugars and obesity under control for a better quality of life and for morbidity issues and he understands. Patient will be seen in follow-up appointment in 1 month or earlier if the patient has any concerns    Medication Adjustments/Labs and Tests Ordered: Current medicines are reviewed at length with the patient today.  Concerns regarding medicines are outlined above.  No orders of the defined types  were placed in this encounter.  No orders of the defined types were placed in this encounter.    No chief complaint on file.    History of Present Illness:    Kevin Lawrence is a 55 y.o. male patient.  He has past medical history of coronary artery disease post CABG surgery, essential hypertension, mixed dyslipidemia, diabetes mellitus and morbid obesity.  He denies any problems at this time and takes care of activities of daily living.  He has had postoperative atrial fibrillation but no recurrence clinically of this.  He wants to go back to work and is following the instructions of his surgeon for the same reason.  At the time of my evaluation, the patient is alert awake oriented and in no distress.  I reviewed her surgical colleagues notes.  Past Medical History:  Diagnosis Date   Acute coronary syndrome ([place]) 04/02/2021   Capsulitis of metatarsophalangeal (MTP) joint of right foot 06/11/2016   Chest pain 04/02/2021   Contusion of right heel 06/09/2017   Coronary artery disease involving native coronary artery of native heart with unstable angina pectoris ([place]) 03/10/2015   Cath 2016:  100% mid Circumflex 90% Om ---> 0% with stent Mid RCA restenosis treated with Angiosculpt Moderate mid LAD   Diabetes mellitus due to underlying condition with unspecified complications ([place]) 48/27/0786   Dizzy spells    Dyslipidemia 03/10/2015   Essential hypertension 03/10/2015   Foot pain, bilateral 09/29/2020   Hyperlipidemia associated with  type 2 diabetes mellitus ([place]) 03/10/2015   Ingrown nail 07/28/2018   Injury of toenail of left foot 06/21/2016   Loose toenail 06/21/2016   Morbid obesity with BMI of 45.0-49.9, adult ([place]) 03/10/2015   Nondisplaced fracture of body of right calcaneus, initial encounter for closed fracture 06/09/2017   OSA on CPAP 02/24/2017   Plantar callus 05/13/2019   Right foot pain 06/09/2017   S/P CABG x 4 04/05/2021   SOB (shortness of breath) 10/01/2017   SVT  (supraventricular tachycardia) ([place]) 02/21/2017   Uncontrolled type 2 diabetes mellitus without complication, with long-term current use of insulin 06/11/2016    Past Surgical History:  Procedure Laterality Date   CORONARY ARTERY BYPASS GRAFT N/A 04/05/2021   Procedure: CORONARY ARTERY BYPASS GRAFTING (CABG) X 4, USING LEFT INTERNAL MAMMARY ARTERY, LEFT RADIAL ARTERY, AND RIGHT LEG GREATER SAPHENOUS VEIN HARVESTED ENDOSCOPICALLY;  Surgeon: Lajuana Matte, MD;  Location: [facility];  Service: Open Heart Surgery;  Laterality: N/A;   INTRAVASCULAR PRESSURE WIRE/FFR STUDY N/A 04/02/2021   Procedure: INTRAVASCULAR PRESSURE WIRE/FFR STUDY;  Surgeon: Leonie Man, MD;  Location: [facility];  Service: Cardiovascular;  Laterality: N/A;   LEFT HEART CATH AND CORONARY ANGIOGRAPHY N/A 10/06/2017   Procedure: LEFT HEART CATH AND CORONARY ANGIOGRAPHY;  Surgeon: Belva Crome, MD;  Location: [facility];  Service: Cardiovascular;  Laterality: N/A;   LEFT HEART CATH AND CORONARY ANGIOGRAPHY N/A 04/02/2021   Procedure: LEFT HEART CATH AND CORONARY ANGIOGRAPHY;  Surgeon: Leonie Man, MD;  Location: [place] [facility];  Service: Cardiovascular;  Laterality: N/A;   RADIAL ARTERY HARVEST Left 04/05/2021   Procedure: RADIAL ARTERY HARVEST;  Surgeon: Lajuana Matte, MD;  Location: [place];  Service: Open Heart Surgery;  Laterality: Left;   TEE WITHOUT CARDIOVERSION N/A 04/05/2021   Procedure: TRANSESOPHAGEAL ECHOCARDIOGRAM (TEE);  Surgeon: Lajuana Matte, MD;  Location: [facility];  Service: Open Heart Surgery;  Laterality: N/A;    Current Medications: Current Meds  Medication Sig   amiodarone (PACERONE) 200 MG tablet Take 1 tablet (200 mg total) by mouth daily.   amLODipine (NORVASC) 10 MG tablet Take 10 mg by mouth daily.    apixaban (ELIQUIS) 5 MG TABS tablet TAKE 1 TABLET BY MOUTH TWICE A DAY   aspirin EC 81 MG tablet Take 81 mg by mouth daily.    budesonide-formoterol (SYMBICORT)  160-4.5 MCG/ACT inhaler Inhale 2 puffs into the lungs as needed for wheezing or shortness of breath.   colesevelam (WELCHOL) 625 MG tablet Take 1,875 mg by mouth 2 (two) times daily with a meal.    famotidine (PEPCID) 20 MG tablet Take 20 mg by mouth daily as needed for heartburn.   furosemide (LASIX) 20 MG tablet Take 1 tablet (20 mg total) by mouth daily.   liraglutide (VICTOZA) 18 MG/3ML SOPN Inject 1.8 mg into the skin daily.   metoprolol tartrate (LOPRESSOR) 50 MG tablet TAKE 1 TABLET BY MOUTH TWICE A DAY   nystatin cream (MYCOSTATIN) Apply 1 application topically 2 (two) times daily. Mixed with kenalog and put into ears   pioglitazone-metformin (ACTOPLUS MET) 15-850 MG tablet Take 1 tablet by mouth 2 (two) times daily with a meal.    potassium chloride (KLOR-CON) 10 MEQ tablet Take 1 tablet (10 mEq total) by mouth daily.   rosuvastatin (CRESTOR) 40 MG tablet Take 40 mg by mouth daily.   terbinafine (LAMISIL) 250 MG tablet Take 250 mg by mouth daily.   TOUJEO SOLOSTAR  300 UNIT/ML SOPN Inject 80 Units into the skin daily.   triamcinolone cream (KENALOG) 0.1 % Apply 1 application topically 2 (two) times daily. In ear mixed with nystatin     Allergies:   Codeine and Hydrocodone-acetaminophen   Social History   Socioeconomic History   Marital status: Single    Spouse name: Not on file   Number of children: Not on file   Years of education: Not on file   Highest education level: Not on file  Occupational History   Not on file  Tobacco Use   Smoking status: Former   Smokeless tobacco: Former  Substance and Sexual Activity   Alcohol use: Yes    Alcohol/week: 1.0 standard drink    Types: 1 Standard drinks or equivalent per week   Drug use: Not on file   Sexual activity: Not on file  Other Topics Concern   Not on file  Social History Narrative   Not on file   Social Determinants of Health   Financial Resource Strain: Not on file  Food Insecurity: Not on file  Transportation  Needs: Not on file  Physical Activity: Not on file  Stress: Not on file  Social Connections: Not on file     Family History: The patient's family history includes Cancer in his mother; Heart disease in his father; Stroke in his mother.  ROS:   Please see the history of present illness.    All other systems reviewed and are negative.  EKGs/Labs/Other Studies Reviewed:    The following studies were reviewed today: I discussed my findings with the patient at length.  EKG reveals sinus rhythm first-degree AV block and nonspecific ST-T changes   Recent Labs: 04/06/2021: Magnesium 2.0 04/14/2021: Hemoglobin 10.8; Platelets 438 05/15/2021: ALT 18; BUN 19; Creatinine, Ser 1.07; Potassium 4.0; Sodium 140  Recent Lipid Panel No results found for: CHOL, TRIG, HDL, CHOLHDL, VLDL, LDLCALC, LDLDIRECT  Physical Exam:    VS:  BP (!) 142/90   Pulse 72   Ht '6\' 3"'  (1.905 m)   Wt (!) 361 lb 9.6 oz (164 kg)   SpO2 97%   BMI 45.20 kg/m     Wt Readings from Last 3 Encounters:  06/22/21 (!) 361 lb 9.6 oz (164 kg)  06/08/21 (!) 357 lb (161.9 kg)  05/17/21 (!) 362 lb (164.2 kg)     GEN: Patient is in no acute distress HEENT: Normal NECK: No JVD; No carotid bruits LYMPHATICS: No lymphadenopathy CARDIAC: Hear sounds regular, 2/6 systolic murmur at the apex. RESPIRATORY:  Clear to auscultation without rales, wheezing or rhonchi  ABDOMEN: Soft, non-tender, non-distended MUSCULOSKELETAL:  No edema; No deformity  SKIN: Warm and dry NEUROLOGIC:  Alert and oriented x 3 PSYCHIATRIC:  Normal affect   Signed, Jenean Lindau, MD  06/22/2021 10:15 AM    [place]

## 2021-07-04 ENCOUNTER — Other Ambulatory Visit: Payer: Self-pay | Admitting: Physician Assistant

## 2021-07-04 ENCOUNTER — Other Ambulatory Visit: Payer: Self-pay | Admitting: Cardiology

## 2021-07-05 ENCOUNTER — Other Ambulatory Visit: Payer: Self-pay

## 2021-07-05 ENCOUNTER — Ambulatory Visit (INDEPENDENT_AMBULATORY_CARE_PROVIDER_SITE_OTHER): Payer: Self-pay | Admitting: Physician Assistant

## 2021-07-05 VITALS — BP 140/80 | HR 72 | Resp 20 | Ht 75.0 in | Wt 368.0 lb

## 2021-07-05 DIAGNOSIS — Z951 Presence of aortocoronary bypass graft: Secondary | ICD-10-CM

## 2021-07-05 NOTE — Progress Notes (Signed)
HPI: Patient returns for routine postoperative follow-up having undergone CABG x4 by Dr. Kipp Brood on 04/05/2021. The patient's early postoperative recovery while in the hospital was notable for atrial fibrillation.  He converted back to sinus rhythm and was discharged on amiodarone. Since discharge, he has continued to progress slowly.  He has been seen in follow-up both in our office and by his cardiologist, Dr. Geraldo Pitter.  The amiodarone dose has been tapered to 100 mg p.o. daily and he is also been started on Eliquis by Dr. Geraldo Pitter.  Lower extremity edema was treated with oral diuresis with appropriate response. Today, he reports he is nearly 3 months post coronary bypass grafting and overall feels he is progressing.  He continues to have some pain on the right costochondral cartilage. He would like to return will review primarily sits at a desk and avoids computer.    Current Outpatient Medications  Medication Sig Dispense Refill   amiodarone (PACERONE) 200 MG tablet Take 0.5 tablets (100 mg total) by mouth daily. 90 tablet 3   amLODipine (NORVASC) 10 MG tablet Take 10 mg by mouth daily.   4   apixaban (ELIQUIS) 5 MG TABS tablet TAKE 1 TABLET BY MOUTH TWICE A DAY 180 tablet 1   aspirin EC 81 MG tablet Take 81 mg by mouth daily.      budesonide-formoterol (SYMBICORT) 160-4.5 MCG/ACT inhaler Inhale 2 puffs into the lungs as needed for wheezing or shortness of breath.     colesevelam (WELCHOL) 625 MG tablet Take 1,875 mg by mouth 2 (two) times daily with a meal.      famotidine (PEPCID) 20 MG tablet Take 20 mg by mouth daily as needed for heartburn.     furosemide (LASIX) 20 MG tablet Take 1 tablet (20 mg total) by mouth daily. 30 tablet 0   liraglutide (VICTOZA) 18 MG/3ML SOPN Inject 1.8 mg into the skin daily.     metoprolol tartrate (LOPRESSOR) 50 MG tablet TAKE 1 TABLET BY MOUTH TWICE A DAY 180 tablet 2   nystatin cream (MYCOSTATIN) Apply 1 application topically 2 (two) times daily. Mixed  with kenalog and put into ears     pioglitazone-metformin (ACTOPLUS MET) 15-850 MG tablet Take 1 tablet by mouth 2 (two) times daily with a meal.      potassium chloride (KLOR-CON) 10 MEQ tablet Take 1 tablet (10 mEq total) by mouth daily. 30 tablet 0   rosuvastatin (CRESTOR) 40 MG tablet Take 40 mg by mouth daily.  3   terbinafine (LAMISIL) 250 MG tablet Take 250 mg by mouth daily.     TOUJEO SOLOSTAR 300 UNIT/ML SOPN Inject 80 Units into the skin daily.  3   triamcinolone cream (KENALOG) 0.1 % Apply 1 application topically 2 (two) times daily. In ear mixed with nystatin     No current facility-administered medications for this visit.    Physical Exam: VS: BP  140/80 HR  72 RR  20 SpO2  95%  Heart: Regular rate and rhythm Chest: Breath sounds are full, equal, and clear to auscultation Extremities: Left radial harvest incisions well-healed.  He has no motor or sensory deficit. There is mild edema in both ankles which he says is chronic.   Diagnostic Tests: None today  Impression / Plan: Future breathing is further in his continues to make satisfactory recovery from coronary bypass grafting x4 3 months ago.  He appears to be in stable sinus rhythm.  Incisions are well-healed sternum is stable.  He may return to work  with limited lifting no more than 25 pounds and then climbing ladders for 3 months.  He plans to continue amiodarone for 2 more weeks then discontinue per cardiology's recommendations.  He has scheduled follow-up with Dr. Geraldo Pitter in April. He was given prior to return to work today.  No further follow-up is scheduled with our service but I assured Mr Benkert we would be glad to see him if we may assist further.   Antony Odea, PA-C [facility] [phone]

## 2021-07-05 NOTE — Patient Instructions (Signed)
May return to work, no lifting >25 lbs for 3 months and no climbing ladders for 3 months.  No change in medications. Agree with stopping amiodarone in 2 weeks.

## 2021-07-08 ENCOUNTER — Other Ambulatory Visit: Payer: Self-pay | Admitting: Physician Assistant

## 2021-08-12 ENCOUNTER — Other Ambulatory Visit: Payer: Self-pay | Admitting: Physician Assistant

## 2021-09-26 ENCOUNTER — Ambulatory Visit: Payer: 59 | Admitting: Neurology

## 2021-12-08 ENCOUNTER — Other Ambulatory Visit: Payer: Self-pay | Admitting: Cardiology

## 2021-12-10 NOTE — Telephone Encounter (Signed)
Prescription refill request for Eliquis received. ?Indication:Afib ?Last office visit:10/22 ?Scr:1.0 ?Age: 56 ?Weight:166.9 kg ? ?Prescription refilled ? ?

## 2021-12-19 ENCOUNTER — Other Ambulatory Visit: Payer: Self-pay

## 2021-12-21 ENCOUNTER — Ambulatory Visit: Payer: 59 | Admitting: Cardiology

## 2022-03-13 ENCOUNTER — Ambulatory Visit: Payer: 59 | Admitting: Cardiology

## 2022-03-13 ENCOUNTER — Encounter: Payer: Self-pay | Admitting: Cardiology

## 2022-03-13 VITALS — BP 164/75 | HR 69 | Ht 75.0 in | Wt 376.6 lb

## 2022-03-13 DIAGNOSIS — I10 Essential (primary) hypertension: Secondary | ICD-10-CM

## 2022-03-13 DIAGNOSIS — I9789 Other postprocedural complications and disorders of the circulatory system, not elsewhere classified: Secondary | ICD-10-CM

## 2022-03-13 DIAGNOSIS — I25110 Atherosclerotic heart disease of native coronary artery with unstable angina pectoris: Secondary | ICD-10-CM | POA: Diagnosis not present

## 2022-03-13 DIAGNOSIS — Z6841 Body Mass Index (BMI) 40.0 and over, adult: Secondary | ICD-10-CM

## 2022-03-13 DIAGNOSIS — E1169 Type 2 diabetes mellitus with other specified complication: Secondary | ICD-10-CM

## 2022-03-13 DIAGNOSIS — E088 Diabetes mellitus due to underlying condition with unspecified complications: Secondary | ICD-10-CM

## 2022-03-13 DIAGNOSIS — Z9989 Dependence on other enabling machines and devices: Secondary | ICD-10-CM

## 2022-03-13 DIAGNOSIS — E785 Hyperlipidemia, unspecified: Secondary | ICD-10-CM

## 2022-03-13 DIAGNOSIS — G4733 Obstructive sleep apnea (adult) (pediatric): Secondary | ICD-10-CM

## 2022-03-13 DIAGNOSIS — I4891 Unspecified atrial fibrillation: Secondary | ICD-10-CM

## 2022-03-13 DIAGNOSIS — Z951 Presence of aortocoronary bypass graft: Secondary | ICD-10-CM

## 2022-03-13 NOTE — Progress Notes (Signed)
Cardiology Office Note:    Date:  03/13/2022   ID:  Kevin Lawrence, DOB 07-Feb-1966, MRN [id number]  PCP:  Helen Hashimoto., MD  Cardiologist:  Jenean Lindau, MD   Referring MD: Helen Hashimoto., MD    ASSESSMENT:    1. Coronary artery disease involving native coronary artery of native heart with unstable angina pectoris ([place])   2. Essential hypertension   3. Hyperlipidemia associated with type 2 diabetes mellitus ([place])   4. Postoperative atrial fibrillation (HCC)   5. Diabetes mellitus due to underlying condition with unspecified complications ([place])   6. Morbid obesity with BMI of 45.0-49.9, adult ([place])   7. OSA on CPAP   8. S/P CABG x 4    PLAN:    In order of problems listed above:  Coronary artery disease: Secondary prevention stressed with the patient.  Importance of compliance with diet medication stressed any vocalized understanding.  He was advised to exercise at least 30 minutes a day 5 days a week and he promises to do so. Paroxysmal atrial fibrillation:I discussed with the patient atrial fibrillation, disease process. Management and therapy including rate and rhythm control, anticoagulation benefits and potential risks were discussed extensively with the patient. Patient had multiple questions which were answered to patient's satisfaction. Essential hypertension: As mentioned below he will keep a track of his blood pressures at home and send it to [place].  He has an element of whitecoat hypertension. Mixed dyslipidemia: Lipids reviewed and discussed with the patient at length.  Diet emphasized. Diabetes mellitus, sleep apnea and morbid obesity: Lifestyle modification urged weight reduction stressed risks of obesity explained and he promises that he will take this advice more seriously and is already losing weight according to the patient. Patient will be seen in follow-up appointment in 6 months or earlier if the patient has any concerns    Medication  Adjustments/Labs and Tests Ordered: Current medicines are reviewed at length with the patient today.  Concerns regarding medicines are outlined above.  Orders Placed This Encounter  Procedures   EKG 12-Lead   No orders of the defined types were placed in this encounter.    No chief complaint on file.    History of Present Illness:    Kevin Lawrence is a 56 y.o. male.  Patient has past medical history of coronary artery disease post CABG surgery, essential hypertension, dyslipidemia, diabetes mellitus, morbid obesity, sleep apnea, atrial fibrillation after bypass surgery.  He denies any problems at this time and takes care of activities of daily living.  No chest pain orthopnea or PND.  He has an element of whitecoat hypertension.  At the time of my evaluation, the patient is alert awake oriented and in no distress.  Past Medical History:  Diagnosis Date   Acute coronary syndrome ([place]) 04/02/2021   Capsulitis of metatarsophalangeal (MTP) joint of right foot 06/11/2016   Chest pain 04/02/2021   Contusion of right heel 06/09/2017   Coronary artery disease involving native coronary artery of native heart with unstable angina pectoris ([place]) 03/10/2015   Cath 2016:  100% mid Circumflex 90% Om ---> 0% with stent Mid RCA restenosis treated with Angiosculpt Moderate mid LAD   Diabetes mellitus due to underlying condition with unspecified complications ([place]) 93/90/3009   Dizzy spells    Dyslipidemia 03/10/2015   Essential hypertension 03/10/2015   Foot pain, bilateral 09/29/2020   Hyperlipidemia associated with type 2 diabetes mellitus ([place]) 03/10/2015   Ingrown nail 07/28/2018   Injury  of toenail of left foot 06/21/2016   Loose toenail 06/21/2016   Morbid obesity with BMI of 45.0-49.9, adult ([place]) 03/10/2015   Nondisplaced fracture of body of right calcaneus, initial encounter for closed fracture 06/09/2017   OSA on CPAP 02/24/2017   Plantar callus 05/13/2019   Postoperative atrial  fibrillation ([place]) 06/22/2021   Right foot pain 06/09/2017   S/P CABG x 4 04/05/2021   SOB (shortness of breath) 10/01/2017   SVT (supraventricular tachycardia) ([place]) 02/21/2017   Uncontrolled type 2 diabetes mellitus without complication, with long-term current use of insulin 06/11/2016    Past Surgical History:  Procedure Laterality Date   CORONARY ARTERY BYPASS GRAFT N/A 04/05/2021   Procedure: CORONARY ARTERY BYPASS GRAFTING (CABG) X 4, USING LEFT INTERNAL MAMMARY ARTERY, LEFT RADIAL ARTERY, AND RIGHT LEG GREATER SAPHENOUS VEIN HARVESTED ENDOSCOPICALLY;  Surgeon: Lajuana Matte, MD;  Location: [facility];  Service: Open Heart Surgery;  Laterality: N/A;   INTRAVASCULAR PRESSURE WIRE/FFR STUDY N/A 04/02/2021   Procedure: INTRAVASCULAR PRESSURE WIRE/FFR STUDY;  Surgeon: Leonie Man, MD;  Location: [facility];  Service: Cardiovascular;  Laterality: N/A;   LEFT HEART CATH AND CORONARY ANGIOGRAPHY N/A 10/06/2017   Procedure: LEFT HEART CATH AND CORONARY ANGIOGRAPHY;  Surgeon: Belva Crome, MD;  Location: [facility];  Service: Cardiovascular;  Laterality: N/A;   LEFT HEART CATH AND CORONARY ANGIOGRAPHY N/A 04/02/2021   Procedure: LEFT HEART CATH AND CORONARY ANGIOGRAPHY;  Surgeon: Leonie Man, MD;  Location: [facility];  Service: Cardiovascular;  Laterality: N/A;   RADIAL ARTERY HARVEST Left 04/05/2021   Procedure: RADIAL ARTERY HARVEST;  Surgeon: Lajuana Matte, MD;  Location: [facility];  Service: Open Heart Surgery;  Laterality: Left;   TEE WITHOUT CARDIOVERSION N/A 04/05/2021   Procedure: TRANSESOPHAGEAL ECHOCARDIOGRAM (TEE);  Surgeon: Lajuana Matte, MD;  Location: [facility];  Service: Open Heart Surgery;  Laterality: N/A;    Current Medications: Current Meds  Medication Sig   albuterol (VENTOLIN HFA) 108 (90 Base) MCG/ACT inhaler Inhale 2 puffs into the lungs every 6 (six) hours as needed for wheezing or shortness of breath.   amLODipine (NORVASC) 10 MG  tablet Take 10 mg by mouth daily.    aspirin EC 81 MG tablet Take 81 mg by mouth daily.    budesonide-formoterol (SYMBICORT) 160-4.5 MCG/ACT inhaler Inhale 2 puffs into the lungs as needed for wheezing or shortness of breath.   colesevelam (WELCHOL) 625 MG tablet Take 1,875 mg by mouth 2 (two) times daily with a meal.    ELIQUIS 5 MG TABS tablet TAKE 1 TABLET BY MOUTH TWICE A DAY   famotidine (PEPCID) 20 MG tablet Take 20 mg by mouth daily as needed for heartburn.   gabapentin (NEURONTIN) 100 MG capsule Take 100 mg by mouth 2 (two) times daily as needed for pain.   gabapentin (NEURONTIN) 300 MG capsule Take 300 mg by mouth 2 (two) times daily.   liraglutide (VICTOZA) 18 MG/3ML SOPN Inject 1.8 mg into the skin daily.   metoprolol tartrate (LOPRESSOR) 50 MG tablet TAKE 1 TABLET BY MOUTH TWICE A DAY   nystatin cream (MYCOSTATIN) Apply 1 application topically 2 (two) times daily. Mixed with kenalog and put into ears   pioglitazone-metformin (ACTOPLUS MET) 15-850 MG tablet Take 1 tablet by mouth 2 (two) times daily with a meal.    rosuvastatin (CRESTOR) 40 MG tablet Take 40 mg by mouth daily.   terbinafine (LAMISIL) 250 MG tablet Take 250 mg  by mouth daily.   TOUJEO SOLOSTAR 300 UNIT/ML SOPN Inject 80 Units into the skin daily.   triamcinolone cream (KENALOG) 0.1 % Apply 1 application topically 2 (two) times daily. In ear mixed with nystatin   valsartan (DIOVAN) 320 MG tablet Take 320 mg by mouth daily.     Allergies:   Codeine and Hydrocodone-acetaminophen   Social History   Socioeconomic History   Marital status: Single    Spouse name: Not on file   Number of children: Not on file   Years of education: Not on file   Highest education level: Not on file  Occupational History   Not on file  Tobacco Use   Smoking status: Former   Smokeless tobacco: Former  Substance and Sexual Activity   Alcohol use: Yes    Alcohol/week: 1.0 standard drink of alcohol    Types: 1 Standard drinks or  equivalent per week   Drug use: Not on file   Sexual activity: Not on file  Other Topics Concern   Not on file  Social History Narrative   Not on file   Social Determinants of Health   Financial Resource Strain: Not on file  Food Insecurity: Not on file  Transportation Needs: Not on file  Physical Activity: Not on file  Stress: Not on file  Social Connections: Not on file     Family History: The patient's family history includes Cancer in his mother; Heart disease in his father; Stroke in his mother.  ROS:   Please see the history of present illness.    All other systems reviewed and are negative.  EKGs/Labs/Other Studies Reviewed:    The following studies were reviewed today: I discussed my findings with the patient at length   Recent Labs: 04/06/2021: Magnesium 2.0 04/14/2021: Hemoglobin 10.8; Platelets 438 05/15/2021: ALT 18; BUN 19; Creatinine, Ser 1.07; Potassium 4.0; Sodium 140  Recent Lipid Panel No results found for: "CHOL", "TRIG", "HDL", "CHOLHDL", "VLDL", "LDLCALC", "LDLDIRECT"  Physical Exam:    VS:  BP (!) 164/75   Pulse 69   Ht _0  (1.905 m)   Wt (!) 376 lb 9.6 oz (170.8 kg)   SpO2 95%   BMI 47.07 kg/m     Wt Readings from Last 3 Encounters:  03/13/22 (!) 376 lb 9.6 oz (170.8 kg)  07/05/21 (!) 368 lb (166.9 kg)  06/22/21 (!) 361 lb 9.6 oz (164 kg)     GEN: Patient is in no acute distress HEENT: Normal NECK: No JVD; No carotid bruits LYMPHATICS: No lymphadenopathy CARDIAC: Hear sounds regular, 2/6 systolic murmur at the apex. RESPIRATORY:  Clear to auscultation without rales, wheezing or rhonchi  ABDOMEN: Soft, non-tender, non-distended MUSCULOSKELETAL:  No edema; No deformity  SKIN: Warm and dry NEUROLOGIC:  Alert and oriented x 3 PSYCHIATRIC:  Normal affect   Signed, Jenean Lindau, MD  03/13/2022 4:56 PM    [facility]

## 2022-03-13 NOTE — Patient Instructions (Signed)
Please keep a BP log for 2 weeks and send by MyChart or mail. ? ?Blood Pressure Record Sheet ?To take your blood pressure, you will need a blood pressure machine. You can buy a blood pressure machine (blood pressure monitor) at your clinic, drug store, or online. When choosing one, consider: ?An automatic monitor that has an arm cuff. ?A cuff that wraps snugly around your upper arm. You should be able to fit only one finger between your arm and the cuff. ?A device that stores blood pressure reading results. ?Do not choose a monitor that measures your blood pressure from your wrist or finger. ?Follow your health care provider's instructions for how to take your blood pressure. To use this form: ?Get one reading in the morning (a.m.) 1-2 hours after you take any medicines. ?Get one reading in the evening (p.m.) before supper. ?Write down the results in the spaces on this form. ?Repeat this once a week, or as told by your health care provider. ? ?Make a follow-up appointment with your health care provider to discuss the results. ?Blood pressure log ?Date: _______________________ ?a.m. _____________________(1st reading) HR___________ ? ?          p.m. _____________________(2nd reading) HR__________ ? ?Date: _______________________ ?a.m. _____________________(1st reading) HR___________ ? ?          p.m. _____________________(2nd reading) HR__________ ?Date: _______________________ ?a.m. _____________________(1st reading) HR___________ ? ?          p.m. _____________________(2nd reading) HR__________ ?Date: _______________________ ?a.m. _____________________(1st reading) HR___________ ? ?          p.m. _____________________(2nd reading) HR__________ ? ?Date: _______________________ ?a.m. _____________________(1st reading) HR___________ ? ?          p.m. _____________________(2nd reading) HR__________ ? ?Date: _______________________ ?a.m. _____________________(1st reading) HR___________ ? ?          p.m.  _____________________(2nd reading) HR__________ ? ?Date: _______________________ ?a.m. _____________________(1st reading) HR___________ ? ?          p.m. _____________________(2nd reading) HR__________ ? ? ?This information is not intended to replace advice given to you by your health care provider. Make sure you discuss any questions you have with your health care provider. ?Document Revised: 12/01/2019 Document Reviewed: 12/01/2019 ?Elsevier Patient Education ? 2021 Elsevier Inc.  ? ?Medication Instructions:  ?Your physician recommends that you continue on your current medications as directed. Please refer to the Current Medication list given to you today.  ?*If you need a refill on your cardiac medications before your next appointment, please call your pharmacy* ? ? ?Lab Work: ?None ordered ?If you have labs (blood work) drawn today and your tests are completely normal, you will receive your results only by: ?MyChart Message (if you have MyChart) OR ?A paper copy in the mail ?If you have any lab test that is abnormal or we need to change your treatment, we will call you to review the results. ? ? ?Testing/Procedures: ?None ordered ? ? ?Follow-Up: ?At [facility], you and your health needs are our priority.  As part of our continuing mission to provide you with exceptional heart care, we have created designated Provider Care Teams.  These Care Teams include your primary Cardiologist (physician) and Advanced Practice Providers (APPs -  Physician Assistants and Nurse Practitioners) who all work together to provide you with the care you need, when you need it. ? ?We recommend signing up for the patient portal called "MyChart".  Sign up information is provided on this After Visit Summary.  MyChart is used to connect with patients   for Virtual Visits (Telemedicine).  Patients are able to view lab/test results, encounter notes, upcoming appointments, etc.  Non-urgent messages can be sent to your provider as well.    ?To learn more about what you can do with MyChart, go to [email or web address].   ? ?Your next appointment:   ?9 month(s) ? ?The format for your next appointment:   ?In Person ? ?Provider:   ?Rajan Revankar, MD ? ? ?Other Instructions ?NA ? ?

## 2022-03-26 ENCOUNTER — Other Ambulatory Visit: Payer: Self-pay | Admitting: Cardiology

## 2022-04-22 ENCOUNTER — Telehealth: Payer: Self-pay | Admitting: Neurology

## 2022-04-22 NOTE — Telephone Encounter (Signed)
LVM asking pt to call back to reschedule 10/2 New Patient appointment - MD out

## 2022-05-15 ENCOUNTER — Other Ambulatory Visit: Payer: Self-pay | Admitting: Cardiology

## 2022-05-27 ENCOUNTER — Ambulatory Visit: Payer: 59 | Admitting: Neurology

## 2022-06-17 ENCOUNTER — Ambulatory Visit: Payer: 59 | Admitting: Neurology

## 2022-06-17 ENCOUNTER — Encounter: Payer: Self-pay | Admitting: Neurology

## 2022-06-17 VITALS — BP 146/92 | HR 66 | Ht 75.0 in | Wt 375.5 lb

## 2022-06-17 DIAGNOSIS — R251 Tremor, unspecified: Secondary | ICD-10-CM

## 2022-06-17 HISTORY — DX: Tremor, unspecified: R25.1

## 2022-06-17 MED ORDER — PROPRANOLOL HCL 20 MG PO TABS: 20.0000 mg | ORAL_TABLET | Freq: Two times a day (BID) | ORAL | 6 refills | Status: AC | PRN

## 2022-06-17 NOTE — Progress Notes (Signed)
Chief Complaint  Patient presents with   New Patient (Initial Visit)    Rm 13. Alone. NP/paper proficient/Stephen Megan Salon MD [facility]/bilateral hand tremors.      ASSESSMENT AND PLAN  Kevin Lawrence is a 56 y.o. male   Essential tremor  No parkinsonian features, check TSH,  Inderal as needed Diabetic peripheral neuropathy  Length-dependent sensory changes to mid shin level Obstructive sleep apnea  He was not able to tolerate CPAP machine,  Has multiple vascular risk factor, obesity, hypertension, diabetes, coronary artery disease, sedentary lifestyle, atrial fibrillation, emphasized importance of regular exercise, he is on Eliquis  DIAGNOSTIC DATA (LABS, IMAGING, TESTING) - I reviewed patient records, labs, notes, testing and imaging myself where available.   MEDICAL HISTORY:  Kevin Lawrence is a 56 year old male, seen in request by his primary care physician from [facility] internal medicine Dr. Megan Salon, [place].,  for evaluation of bilateral hands tremor initial evaluation June 17, 2022   I reviewed and summarized the referring note. PMHX. HTN CAD, S/p CABG A Fib HLD DM Toe Nail Fungus infection GERD OSA, could not tolerate CPAP Sedentary life style Use to smoke, Obesity  He noticed bilateral hand tremor since August 2023, intermittent, involving both hands, gradually getting worse, sometimes difficulty holding utensils, difficulty using computer mouse  He denies family history of tremor,  He has poorly controlled diabetes, gradual onset bilateral feet paresthesia, history of obstructive sleep apnea, but could not tolerate CPAP machine,   PHYSICAL EXAM:   Vitals:   06/17/22 0909  BP: (!) 146/92  Pulse: 66  Weight: (!) 375 lb 8 oz (170.3 kg)  Height: '6\' 3"'  (1.905 m)   Not recorded     Body mass index is 46.93 kg/m.  PHYSICAL EXAMNIATION:  Gen: NAD, conversant, well nourised, well groomed                     Cardiovascular: Regular  rate rhythm, no peripheral edema, warm, nontender. Eyes: Conjunctivae clear without exudates or hemorrhage Neck: Supple, no carotid bruits. Pulmonary: Clear to auscultation bilaterally   NEUROLOGICAL EXAM:  MENTAL STATUS: Speech/cognition: Obese, tired looking middle-age male, oriented to history taking and casual conversation CRANIAL NERVES: CN II: Visual fields are full to confrontation. Pupils are round equal and briskly reactive to light. CN III, IV, VI: extraocular movement are normal. No ptosis. CN V: Facial sensation is intact to light touch CN VII: Face is symmetric with normal eye closure  CN VIII: Hearing is normal to causal conversation. CN IX, X: Phonation is normal. CN XI: Head turning and shoulder shrug are intact  MOTOR: Normal strength, mild bilateral hand posturing tremor, did well on spiral circle drawing, no rigidity, no bradykinesia  REFLEXES: Reflexes are 2+ and symmetric at the biceps, triceps, knees, and absent at ankles. Plantar responses are flexor.  SENSORY: Length-dependent decreased light touch, pinprick, vibratory sensation to mid shin level  COORDINATION: There is no trunk or limb dysmetria noted.  GAIT/STANCE: Need push-up to get up from seated position, limited by his big body habitus, steady  REVIEW OF SYSTEMS:  Full 14 system review of systems performed and notable only for as above All other review of systems were negative.   ALLERGIES: Allergies  Allergen Reactions   Codeine Shortness Of Breath    "I turn bright red and have difficulty breathing"   Hydrocodone-Acetaminophen Shortness Of Breath    "I turn red and have difficulty breathing"    HOME MEDICATIONS: Current  Outpatient Medications  Medication Sig Dispense Refill   albuterol (VENTOLIN HFA) 108 (90 Base) MCG/ACT inhaler Inhale 2 puffs into the lungs every 6 (six) hours as needed for wheezing or shortness of breath.     amLODipine (NORVASC) 10 MG tablet Take 10 mg by mouth  daily.   4   aspirin EC 81 MG tablet Take 81 mg by mouth daily.      Biotin 10000 MCG TABS Take 1 tablet by mouth daily.     budesonide-formoterol (SYMBICORT) 160-4.5 MCG/ACT inhaler Inhale 2 puffs into the lungs as needed for wheezing or shortness of breath.     Cholecalciferol (D3-50 PO) Take 1 tablet by mouth daily.     colesevelam (WELCHOL) 625 MG tablet Take 1,875 mg by mouth 2 (two) times daily with a meal.      ELIQUIS 5 MG TABS tablet TAKE 1 TABLET BY MOUTH TWICE A DAY 60 tablet 5   famotidine (PEPCID) 20 MG tablet Take 20 mg by mouth daily as needed for heartburn.     folic acid (FOLVITE) 897 MCG tablet Take 400 mcg by mouth daily.     gabapentin (NEURONTIN) 100 MG capsule Take 100 mg by mouth 2 (two) times daily as needed for pain.     liraglutide (VICTOZA) 18 MG/3ML SOPN Inject 1.8 mg into the skin daily.     metoprolol tartrate (LOPRESSOR) 50 MG tablet Take 1 tablet (50 mg total) by mouth 2 (two) times daily. 180 tablet 2   nystatin cream (MYCOSTATIN) Apply 1 application topically 2 (two) times daily. Mixed with kenalog and put into ears     pioglitazone-metformin (ACTOPLUS MET) 15-850 MG tablet Take 1 tablet by mouth 2 (two) times daily with a meal.      rosuvastatin (CRESTOR) 40 MG tablet Take 40 mg by mouth daily.  3   terbinafine (LAMISIL) 250 MG tablet Take 250 mg by mouth daily.     TOUJEO SOLOSTAR 300 UNIT/ML SOPN Inject 80 Units into the skin daily.  3   triamcinolone cream (KENALOG) 0.1 % Apply 1 application topically 2 (two) times daily. In ear mixed with nystatin     valsartan (DIOVAN) 320 MG tablet Take 320 mg by mouth daily.     No current facility-administered medications for this visit.    PAST MEDICAL HISTORY: Past Medical History:  Diagnosis Date   Acute coronary syndrome ([place]) 04/02/2021   Capsulitis of metatarsophalangeal (MTP) joint of right foot 06/11/2016   Chest pain 04/02/2021   Contusion of right heel 06/09/2017   Coronary artery disease involving native  coronary artery of native heart with unstable angina pectoris ([place]) 03/10/2015   Cath 2016:  100% mid Circumflex 90% Om ---> 0% with stent Mid RCA restenosis treated with Angiosculpt Moderate mid LAD   Diabetes mellitus due to underlying condition with unspecified complications ([employer]) 84/78/4128   Dizzy spells    Dyslipidemia 03/10/2015   Essential hypertension 03/10/2015   Foot pain, bilateral 09/29/2020   Hyperlipidemia associated with type 2 diabetes mellitus ([place]) 03/10/2015   Ingrown nail 07/28/2018   Injury of toenail of left foot 06/21/2016   Loose toenail 06/21/2016   Morbid obesity with BMI of 45.0-49.9, adult ([place]) 03/10/2015   Nondisplaced fracture of body of right calcaneus, initial encounter for closed fracture 06/09/2017   OSA on CPAP 02/24/2017   Plantar callus 05/13/2019   Postoperative atrial fibrillation ([place]) 06/22/2021   Right foot pain 06/09/2017   S/P CABG x 4 04/05/2021   SOB (  shortness of breath) 10/01/2017   SVT (supraventricular tachycardia) 02/21/2017   Uncontrolled type 2 diabetes mellitus without complication, with long-term current use of insulin 06/11/2016    PAST SURGICAL HISTORY: Past Surgical History:  Procedure Laterality Date   CORONARY ARTERY BYPASS GRAFT N/A 04/05/2021   Procedure: CORONARY ARTERY BYPASS GRAFTING (CABG) X 4, USING LEFT INTERNAL MAMMARY ARTERY, LEFT RADIAL ARTERY, AND RIGHT LEG GREATER SAPHENOUS VEIN HARVESTED ENDOSCOPICALLY;  Surgeon: Lajuana Matte, MD;  Location: [facility];  Service: Open Heart Surgery;  Laterality: N/A;   INTRAVASCULAR PRESSURE WIRE/FFR STUDY N/A 04/02/2021   Procedure: INTRAVASCULAR PRESSURE WIRE/FFR STUDY;  Surgeon: Leonie Man, MD;  Location: [place] [facility];  Service: Cardiovascular;  Laterality: N/A;   LEFT HEART CATH AND CORONARY ANGIOGRAPHY N/A 10/06/2017   Procedure: LEFT HEART CATH AND CORONARY ANGIOGRAPHY;  Surgeon: Belva Crome, MD;  Location: [place];  Service: Cardiovascular;   Laterality: N/A;   LEFT HEART CATH AND CORONARY ANGIOGRAPHY N/A 04/02/2021   Procedure: LEFT HEART CATH AND CORONARY ANGIOGRAPHY;  Surgeon: Leonie Man, MD;  Location: [facility];  Service: Cardiovascular;  Laterality: N/A;   RADIAL ARTERY HARVEST Left 04/05/2021   Procedure: RADIAL ARTERY HARVEST;  Surgeon: Lajuana Matte, MD;  Location: [facility];  Service: Open Heart Surgery;  Laterality: Left;   TEE WITHOUT CARDIOVERSION N/A 04/05/2021   Procedure: TRANSESOPHAGEAL ECHOCARDIOGRAM (TEE);  Surgeon: Lajuana Matte, MD;  Location: [facility];  Service: Open Heart Surgery;  Laterality: N/A;    FAMILY HISTORY: Family History  Problem Relation Age of Onset   Cancer Mother    Stroke Mother    Heart disease Father     SOCIAL HISTORY: Social History   Socioeconomic History   Marital status: Single    Spouse name: Not on file   Number of children: Not on file   Years of education: Not on file   Highest education level: Not on file  Occupational History   Not on file  Tobacco Use   Smoking status: Former   Smokeless tobacco: Former  Substance and Sexual Activity   Alcohol use: Yes    Alcohol/week: 1.0 standard drink of alcohol    Types: 1 Standard drinks or equivalent per week   Drug use: Not on file   Sexual activity: Not on file  Other Topics Concern   Not on file  Social History Narrative   Not on file   Social Determinants of Health   Financial Resource Strain: Not on file  Food Insecurity: Not on file  Transportation Needs: Not on file  Physical Activity: Not on file  Stress: Not on file  Social Connections: Not on file  Intimate Partner Violence: Not on file      Marcial Pacas, M.D. Ph.D.  [facility] [address] [place], [place] [address] Ph: [phone] Fax: [phone]  CC:  Helen Hashimoto., MD [place],  [place] [address]  Helen Hashimoto., MD

## 2022-06-18 LAB — TSH: TSH: 0.855 u[IU]/mL (ref 0.450–4.500)

## 2022-06-19 ENCOUNTER — Telehealth: Payer: Self-pay

## 2022-06-19 NOTE — Telephone Encounter (Signed)
I left a VM for normal results (as per DPR). A call back number was provided for any further questions or concerns.

## 2022-06-19 NOTE — Telephone Encounter (Signed)
-----   Message from Marcial Pacas, MD sent at 06/19/2022  9:27 AM EDT ----- Please call patient, normal TSH

## 2022-08-06 ENCOUNTER — Other Ambulatory Visit: Payer: Self-pay | Admitting: Cardiology

## 2022-08-06 DIAGNOSIS — I4891 Unspecified atrial fibrillation: Secondary | ICD-10-CM

## 2022-08-06 NOTE — Telephone Encounter (Signed)
Eliquis 5mg  refill request received. Patient is 56 years old, weight-170.3kg, Crea-1.23 on 01/30/2022 via 04/01/2022, [place], and last seen by Dr. Calascibetta on 03/13/2022. Dose is appropriate based on dosing criteria. Will send in refill to requested pharmacy.

## 2022-10-08 ENCOUNTER — Telehealth: Payer: Self-pay

## 2022-10-08 NOTE — Telephone Encounter (Signed)
Pharmacy please advise on holding Eliquis prior to colonoscopy scheduled for TBD. Thank you.

## 2022-10-08 NOTE — Telephone Encounter (Signed)
   Pre-operative Risk Assessment    Patient Name: Kevin Lawrence  DOB: 1965-10-27 MRN: [id number]      Request for Surgical Clearance    Procedure:   colonoscopy  Date of Surgery:  Clearance TBD                                 Surgeon:  Dr. Christia Reading [place] Surgeon's Group or Practice Name:  [facility] Phone number:  [phone] Fax number:  [phone]   Type of Clearance Requested:   - Pharmacy:  Hold Apixaban (Eliquis) 2 days prior to procedure   Type of Anesthesia:  Not Indicated   Additional requests/questions:    Gretchen Short   10/08/2022, 7:39 AM

## 2022-10-09 NOTE — Telephone Encounter (Signed)
   Patient Name: Kevin Lawrence  DOB: 03/01/1966 MRN: [id number]  Primary Cardiologist: Jenean Lindau, MD  Clinical pharmacists have reviewed the patient's past medical history, labs, and current medications as part of preoperative protocol coverage. The following recommendations have been made:  Procedure:   colonoscopy   Date of Surgery:  Clearance TBD       CHA2DS2-VASc Score = 3   This indicates a 3.2% annual risk of stroke. The patient's score is based upon: CHF History: 0 HTN History: 1 Diabetes History: 1 Stroke History: 0 Vascular Disease History: 1 Age Score: 0 Gender Score: 0   CrCl 117 (with adjusted body weight) Platelet count 233   Per office protocol, patient can hold Eliquis for 2 days prior to procedure.   Patient will not need bridging with Lovenox (enoxaparin) around procedure.   I will route this recommendation to the requesting party via Epic fax function and remove from pre-op pool.  Please call with questions.  Mable Fill, Marissa Nestle, NP 10/09/2022, 10:53 AM

## 2022-10-09 NOTE — Telephone Encounter (Signed)
Patient with diagnosis of atrial fibrillation on Eliquis for anticoagulation.    Procedure:   colonoscopy   Date of Surgery:  Clearance TBD     CHA2DS2-VASc Score = 3   This indicates a 3.2% annual risk of stroke. The patient's score is based upon: CHF History: 0 HTN History: 1 Diabetes History: 1 Stroke History: 0 Vascular Disease History: 1 Age Score: 0 Gender Score: 0  CrCl 117 (with adjusted body weight) Platelet count 233  Per office protocol, patient can hold Eliquis for 2 days prior to procedure.   Patient will not need bridging with Lovenox (enoxaparin) around procedure.  **This guidance is not considered finalized until pre-operative APP has relayed final recommendations.**

## 2022-10-31 ENCOUNTER — Telehealth: Payer: Self-pay | Admitting: Cardiology

## 2022-10-31 NOTE — Telephone Encounter (Signed)
Addendum: we were asked to comment on holding aspirin for procedure.  Ideally aspirin should be continued without interruption, however if the bleeding risk is too great, aspirin may be held for 7 days prior to surgery. Please resume aspirin post operatively when it is felt to be safe from a bleeding standpoint.   Emmaline Life, NP-C  10/31/2022, 11:42 AM [address], [address] Office [phone] Fax [phone]

## 2022-10-31 NOTE — Telephone Encounter (Signed)
Kevin Lawrence is calling wanting to know if the patient can also hold Aspirin for 5 days for the clearance received regarding the colonoscopy. She reports a new clearance does not need to be faxed, a VM can just be left regarding it. Please advise.

## 2022-10-31 NOTE — Telephone Encounter (Signed)
I have addended the original clearance request and forwarded to requesting provider.  Emmaline Life, NP-C  10/31/2022, 11:43 AM [address] Office [phone] Fax [phone]

## 2022-12-18 ENCOUNTER — Other Ambulatory Visit: Payer: Self-pay | Admitting: Cardiology

## 2023-01-15 ENCOUNTER — Other Ambulatory Visit: Payer: Self-pay | Admitting: Cardiology

## 2023-01-23 ENCOUNTER — Other Ambulatory Visit: Payer: Self-pay | Admitting: Cardiology

## 2023-01-23 DIAGNOSIS — I4891 Unspecified atrial fibrillation: Secondary | ICD-10-CM

## 2023-01-23 NOTE — Telephone Encounter (Signed)
Prescription refill request for Eliquis received. Indication:afib Last office visit:7/23 Scr:1.4  4/24 Age: 57 Weight:170.3  kg  Prescription refilled

## 2023-02-16 ENCOUNTER — Other Ambulatory Visit: Payer: Self-pay | Admitting: Cardiology

## 2023-03-19 ENCOUNTER — Other Ambulatory Visit: Payer: Self-pay | Admitting: Cardiology

## 2023-03-30 ENCOUNTER — Other Ambulatory Visit: Payer: Self-pay | Admitting: Cardiology

## 2023-04-30 DIAGNOSIS — M25562 Pain in left knee: Secondary | ICD-10-CM | POA: Insufficient documentation

## 2023-04-30 DIAGNOSIS — M1712 Unilateral primary osteoarthritis, left knee: Secondary | ICD-10-CM

## 2023-04-30 HISTORY — DX: Unilateral primary osteoarthritis, left knee: M17.12

## 2023-04-30 HISTORY — DX: Pain in left knee: M25.562

## 2023-05-27 ENCOUNTER — Encounter: Payer: Self-pay | Admitting: Cardiology

## 2023-05-27 ENCOUNTER — Ambulatory Visit: Payer: BC Managed Care – PPO | Attending: Cardiology | Admitting: Cardiology

## 2023-05-27 VITALS — BP 140/98 | HR 74 | Ht 75.0 in | Wt 355.6 lb

## 2023-05-27 DIAGNOSIS — Z6841 Body Mass Index (BMI) 40.0 and over, adult: Secondary | ICD-10-CM

## 2023-05-27 DIAGNOSIS — E785 Hyperlipidemia, unspecified: Secondary | ICD-10-CM

## 2023-05-27 DIAGNOSIS — I25110 Atherosclerotic heart disease of native coronary artery with unstable angina pectoris: Secondary | ICD-10-CM

## 2023-05-27 DIAGNOSIS — I10 Essential (primary) hypertension: Secondary | ICD-10-CM | POA: Diagnosis not present

## 2023-05-27 DIAGNOSIS — E088 Diabetes mellitus due to underlying condition with unspecified complications: Secondary | ICD-10-CM

## 2023-05-27 DIAGNOSIS — Z951 Presence of aortocoronary bypass graft: Secondary | ICD-10-CM | POA: Diagnosis not present

## 2023-05-27 MED ORDER — NITROGLYCERIN 0.4 MG SL SUBL: 0.4000 mg | SUBLINGUAL_TABLET | SUBLINGUAL | 6 refills | Status: DC | PRN

## 2023-05-27 MED ORDER — EZETIMIBE 10 MG PO TABS
10.0000 mg | ORAL_TABLET | Freq: Every day | ORAL | 3 refills | Status: DC
Start: 2023-05-27 — End: 2024-05-04

## 2023-05-27 NOTE — Patient Instructions (Addendum)
Medication Instructions:  Your physician has recommended you make the following change in your medication:   Start Zetia 10 mg daily  Use nitroglycerin 1 tablet placed under the tongue at the first sign of chest pain or an angina attack. 1 tablet may be used every 5 minutes as needed, for up to 15 minutes. Do not take more than 3 tablets in 15 minutes. If pain persist call 911 or go to the nearest ED.   *If you need a refill on your cardiac medications before your next appointment, please call your pharmacy*   Lab Work: Your physician recommends that you return for lab work in: 2 months for CMP and lipids You need to have labs done when you are fasting.  You can come Monday through Friday 8:30 am to 12:00 pm and 1:15 to 4:30. You do not need to make an appointment as the order has already been placed.   If you have labs (blood work) drawn today and your tests are completely normal, you will receive your results only by: MyChart Message (if you have MyChart) OR A paper copy in the mail If you have any lab test that is abnormal or we need to change your treatment, we will call you to review the results.   Testing/Procedures: Your physician has requested that you have an echocardiogram. Echocardiography is a painless test that uses sound waves to create images of your heart. It provides your doctor with information about the size and shape of your heart and how well your heart's chambers and valves are working. This procedure takes approximately one hour. There are no restrictions for this procedure. Please do NOT wear cologne, perfume, aftershave, or lotions (deodorant is allowed). Please arrive 15 minutes prior to your appointment time.     Follow-Up: At [facility], you and your health needs are our priority.  As part of our continuing mission to provide you with exceptional heart care, we have created designated Provider Care Teams.  These Care Teams include your primary  Cardiologist (physician) and Advanced Practice Providers (APPs -  Physician Assistants and Nurse Practitioners) who all work together to provide you with the care you need, when you need it.  We recommend signing up for the patient portal called "MyChart".  Sign up information is provided on this After Visit Summary.  MyChart is used to connect with patients for Virtual Visits (Telemedicine).  Patients are able to view lab/test results, encounter notes, upcoming appointments, etc.  Non-urgent messages can be sent to your provider as well.   To learn more about what you can do with MyChart, go to [email or web address].    Your next appointment:   9 month(s)  The format for your next appointment:   In Person  Provider:   Belva Crome, MD   Other Instructions Echocardiogram An echocardiogram is a test that uses sound waves (ultrasound) to produce images of the heart. Images from an echocardiogram can provide important information about: Heart size and shape. The size and thickness and movement of your heart's walls. Heart muscle function and strength. Heart valve function or if you have stenosis. Stenosis is when the heart valves are too narrow. If blood is flowing backward through the heart valves (regurgitation). A tumor or infectious growth around the heart valves. Areas of heart muscle that are not working well because of poor blood flow or injury from a heart attack. Aneurysm detection. An aneurysm is a weak or damaged part of an artery wall. The wall  bulges out from the normal force of blood pumping through the body. Tell a health care provider about: Any allergies you have. All medicines you are taking, including vitamins, herbs, eye drops, creams, and over-the-counter medicines. Any blood disorders you have. Any surgeries you have had. Any medical conditions you have. Whether you are pregnant or may be pregnant. What are the risks? Generally, this is a safe test. However,  problems may occur, including an allergic reaction to dye (contrast) that may be used during the test. What happens before the test? No specific preparation is needed. You may eat and drink normally. What happens during the test? You will take off your clothes from the waist up and put on a hospital gown. Electrodes or electrocardiogram (ECG)patches may be placed on your chest. The electrodes or patches are then connected to a device that monitors your heart rate and rhythm. You will lie down on a table for an ultrasound exam. A gel will be applied to your chest to help sound waves pass through your skin. A handheld device, called a transducer, will be pressed against your chest and moved over your heart. The transducer produces sound waves that travel to your heart and bounce back (or "echo" back) to the transducer. These sound waves will be captured in real-time and changed into images of your heart that can be viewed on a video monitor. The images will be recorded on a computer and reviewed by your health care provider. You may be asked to change positions or hold your breath for a short time. This makes it easier to get different views or better views of your heart. In some cases, you may receive contrast through an IV in one of your veins. This can improve the quality of the pictures from your heart. The procedure may vary among health care providers and hospitals.   What can I expect after the test? You may return to your normal, everyday life, including diet, activities, and medicines, unless your health care provider tells you not to do that. Follow these instructions at home: It is up to you to get the results of your test. Ask your health care provider, or the department that is doing the test, when your results will be ready. Keep all follow-up visits. This is important. Summary An echocardiogram is a test that uses sound waves (ultrasound) to produce images of the heart. Images from an  echocardiogram can provide important information about the size and shape of your heart, heart muscle function, heart valve function, and other possible heart problems. You do not need to do anything to prepare before this test. You may eat and drink normally. After the echocardiogram is completed, you may return to your normal, everyday life, unless your health care provider tells you not to do that. This information is not intended to replace advice given to you by your health care provider. Make sure you discuss any questions you have with your health care provider. Document Revised: 04/04/2020 Document Reviewed: 04/04/2020 Elsevier Patient Education  2021 Elsevier Inc.   Important Information About Sugar

## 2023-05-27 NOTE — Progress Notes (Signed)
Cardiology Office Note:    Date:  05/27/2023   ID:  Kevin Lawrence, DOB [birth year], MRN [id number]  PCP:  Wilmer Floor., MD  Cardiologist:  Garwin Brothers, MD   Referring MD: Wilmer Floor., MD    ASSESSMENT:    1. Essential hypertension   2. Coronary artery disease involving native coronary artery of native heart with unstable angina pectoris (HCC)   3. Diabetes mellitus due to underlying condition with unspecified complications (HCC)   4. S/P CABG x 4   5. Morbid obesity with BMI of 45.0-49.9, adult (HCC)   6. Dyslipidemia    PLAN:    In order of problems listed above:  Coronary artery disease: Post CABG surgery: Secondary prevention stressed with the patient.  Importance of compliance with diet medication stressed any vocalized understanding.  He was advised to walk at least half an hour a day 5 days a week and he tells me is going to try to do this. Essential hypertension: Blood pressure stable and diet was emphasized lifestyle modification urged. Paroxysmal atrial fibrillation:I discussed with the patient atrial fibrillation, disease process. Management and therapy including rate and rhythm control, anticoagulation benefits and potential risks were discussed extensively with the patient. Patient had multiple questions which were answered to patient's satisfaction. Cardiac murmur: Echocardiogram will be done to assess murmur heard on auscultation. Mixed dyslipidemia and diabetes mellitus and morbid obesity: Weight reduction stressed lifestyle modification urged patient promises to do better.  We will initiate patient on Zetia 10 mg daily and back in 6 weeks for liver lipid check.  Benefits risks explained. Sublingual nitroglycerin prescription was sent, its protocol and 911 protocol explained and the patient vocalized understanding questions were answered to the patient's satisfaction Patient will be seen in follow-up appointment in 6 months or earlier if the patient  has any concerns.    Medication Adjustments/Labs and Tests Ordered: Current medicines are reviewed at length with the patient today.  Concerns regarding medicines are outlined above.  Orders Placed This Encounter  Procedures   EKG 12-Lead   No orders of the defined types were placed in this encounter.    Chief Complaint  Patient presents with   Follow-up     History of Present Illness:    Kevin Lawrence is a 57 y.o. male.  Patient has past medical history of coronary artery disease post CABG surgery, essential hypertension, diabetes mellitus and mixed dyslipidemia.  He is morbidly obese and leads a sedentary lifestyle.  He denies any chest pain orthopnea or PND.  At the time of my evaluation, the patient is alert awake oriented and in no distress.  He has had a history of paroxysmal atrial fibrillation in the past.  Past Medical History:  Diagnosis Date   Acute coronary syndrome (HCC) 04/02/2021   Capsulitis of metatarsophalangeal (MTP) joint of right foot 06/11/2016   Chest pain 04/02/2021   Contusion of right heel 06/09/2017   Coronary artery disease involving native coronary artery of native heart with unstable angina pectoris (HCC) 03/10/2015   Cath 2016:  100% mid Circumflex 90% Om ---> 0% with stent Mid RCA restenosis treated with Angiosculpt Moderate mid LAD   Diabetes mellitus due to underlying condition with unspecified complications (HCC) 06/19/2015   Dizzy spells    Dyslipidemia 03/10/2015   Essential hypertension 03/10/2015   Foot pain, bilateral 09/29/2020   Hyperlipidemia associated with type 2 diabetes mellitus (HCC) 03/10/2015   Ingrown nail 07/28/2018   Injury of toenail of  left foot 06/21/2016   Loose toenail 06/21/2016   Morbid obesity with BMI of 45.0-49.9, adult (HCC) 03/10/2015   Nondisplaced fracture of body of right calcaneus, initial encounter for closed fracture 06/09/2017   OSA on CPAP 02/24/2017   Plantar callus 05/13/2019   Postoperative atrial  fibrillation (HCC) 06/22/2021   Right foot pain 06/09/2017   S/P CABG x 4 04/05/2021   SOB (shortness of breath) 10/01/2017   SVT (supraventricular tachycardia) (HCC) 02/21/2017   Uncontrolled type 2 diabetes mellitus without complication, with long-term current use of insulin 06/11/2016    Past Surgical History:  Procedure Laterality Date   CORONARY ARTERY BYPASS GRAFT N/A 04/05/2021   Procedure: CORONARY ARTERY BYPASS GRAFTING (CABG) X 4, USING LEFT INTERNAL MAMMARY ARTERY, LEFT RADIAL ARTERY, AND RIGHT LEG GREATER SAPHENOUS VEIN HARVESTED ENDOSCOPICALLY;  Surgeon: Corliss Skains, MD;  Location: [facility] OR;  Service: Open Heart Surgery;  Laterality: N/A;   CORONARY PRESSURE/FFR STUDY N/A 04/02/2021   Procedure: INTRAVASCULAR PRESSURE WIRE/FFR STUDY;  Surgeon: Marykay Lex, MD;  Location: [facility] INVASIVE CV LAB;  Service: Cardiovascular;  Laterality: N/A;   LEFT HEART CATH AND CORONARY ANGIOGRAPHY N/A 10/06/2017   Procedure: LEFT HEART CATH AND CORONARY ANGIOGRAPHY;  Surgeon: Lyn Records, MD;  Location: [facility] INVASIVE CV LAB;  Service: Cardiovascular;  Laterality: N/A;   LEFT HEART CATH AND CORONARY ANGIOGRAPHY N/A 04/02/2021   Procedure: LEFT HEART CATH AND CORONARY ANGIOGRAPHY;  Surgeon: Marykay Lex, MD;  Location: [facility] INVASIVE CV LAB;  Service: Cardiovascular;  Laterality: N/A;   RADIAL ARTERY HARVEST Left 04/05/2021   Procedure: RADIAL ARTERY HARVEST;  Surgeon: Corliss Skains, MD;  Location: [facility] OR;  Service: Open Heart Surgery;  Laterality: Left;   TEE WITHOUT CARDIOVERSION N/A 04/05/2021   Procedure: TRANSESOPHAGEAL ECHOCARDIOGRAM (TEE);  Surgeon: Corliss Skains, MD;  Location: [facility] OR;  Service: Open Heart Surgery;  Laterality: N/A;    Current Medications: Current Meds  Medication Sig   albuterol (VENTOLIN HFA) 108 (90 Base) MCG/ACT inhaler Inhale 2 puffs into the lungs every 6 (six) hours as needed for wheezing or shortness of breath.   amLODipine (NORVASC) 10 MG tablet Take  10 mg by mouth daily.    aspirin EC 81 MG tablet Take 81 mg by mouth daily.    Biotin 16109 MCG TABS Take 1 tablet by mouth daily.   budesonide-formoterol (SYMBICORT) 160-4.5 MCG/ACT inhaler Inhale 2 puffs into the lungs as needed for wheezing or shortness of breath.   Cholecalciferol (D3-50 PO) Take 1 tablet by mouth daily.   colesevelam (WELCHOL) 625 MG tablet Take 1,875 mg by mouth 2 (two) times daily with a meal.    ELIQUIS 5 MG TABS tablet TAKE 1 TABLET BY MOUTH TWICE A DAY (Patient taking differently: Take 5 mg by mouth 2 (two) times daily.)   famotidine (PEPCID) 20 MG tablet Take 20 mg by mouth daily as needed for heartburn.   folic acid (FOLVITE) 400 MCG tablet Take 400 mcg by mouth daily.   gabapentin (NEURONTIN) 100 MG capsule Take 100 mg by mouth 2 (two) times daily as needed for pain.   liraglutide (VICTOZA) 18 MG/3ML SOPN Inject 1.8 mg into the skin daily.   metoprolol tartrate (LOPRESSOR) 50 MG tablet Take 1 tablet (50 mg total) by mouth 2 (two) times daily. Patient must keep appointment for 05-27-23 for further refills. 3 rd/final attempt   nystatin cream (MYCOSTATIN) Apply 1 application topically 2 (two) times daily. Mixed with kenalog and put into  ears   pioglitazone-metformin (ACTOPLUS MET) 15-850 MG tablet Take 1 tablet by mouth 2 (two) times daily with a meal.    propranolol (INDERAL) 20 MG tablet Take 1 tablet (20 mg total) by mouth 2 (two) times daily as needed.   rosuvastatin (CRESTOR) 40 MG tablet Take 40 mg by mouth daily.   Semaglutide (OZEMPIC, 1 MG/DOSE, [place]) Inject 1 mg into the skin once a week.   terbinafine (LAMISIL) 250 MG tablet Take 250 mg by mouth daily.   TOUJEO SOLOSTAR 300 UNIT/ML SOPN Inject 80 Units into the skin daily.   triamcinolone cream (KENALOG) 0.1 % Apply 1 application topically 2 (two) times daily. In ear mixed with nystatin   valsartan (DIOVAN) 320 MG tablet Take 320 mg by mouth daily.     Allergies:   Codeine, Hydrocodone-acetaminophen, and  Hydrocodone   Social History   Socioeconomic History   Marital status: Single    Spouse name: Not on file   Number of children: Not on file   Years of education: Not on file   Highest education level: Not on file  Occupational History   Not on file  Tobacco Use   Smoking status: Former   Smokeless tobacco: Former  Substance and Sexual Activity   Alcohol use: Yes    Alcohol/week: 1.0 standard drink of alcohol    Types: 1 Standard drinks or equivalent per week   Drug use: Not on file   Sexual activity: Not on file  Other Topics Concern   Not on file  Social History Narrative   Not on file   Social Determinants of Health   Financial Resource Strain: Not on file  Food Insecurity: Not on file  Transportation Needs: Not on file  Physical Activity: Not on file  Stress: Not on file  Social Connections: Not on file     Family History: The patient's family history includes Cancer in his mother; Heart disease in his father; Stroke in his mother.  ROS:   Please see the history of present illness.    All other systems reviewed and are negative.  EKGs/Labs/Other Studies Reviewed:    The following studies were reviewed today: .Marland KitchenEKG Interpretation Date/Time:  Tuesday May 27 2023 12:47:09 EDT Ventricular Rate:  73 PR Interval:  244 QRS Duration:  90 QT Interval:  422 QTC Calculation: 464 R Axis:   -20  Text Interpretation: Sinus rhythm with sinus arrhythmia with 1st degree A-V block with occasional Premature ventricular complexes Inferior infarct , age undetermined Cannot rule out Anterior infarct , age undetermined ST & T wave abnormality, consider lateral ischemia Abnormal ECG When compared with ECG of 07-Apr-2021 17:45, Significant changes have occurred Confirmed by Belva Crome [phone]) on 05/27/2023 12:51:43 PM     Recent Labs: 06/17/2022: TSH 0.855  Recent Lipid Panel No results found for: "CHOL", "TRIG", "HDL", "CHOLHDL", "VLDL", "LDLCALC",  "LDLDIRECT"  Physical Exam:    VS:  BP (!) 132/90 (BP Location: Left Arm, Patient Position: Sitting)   Pulse 74   Ht 6\' 3"  (1.905 m)   Wt (!) 355 lb 9.6 oz (161.3 kg)   SpO2 99%   BMI 44.45 kg/m     Wt Readings from Last 3 Encounters:  05/27/23 (!) 355 lb 9.6 oz (161.3 kg)  06/17/22 (!) 375 lb 8 oz (170.3 kg)  03/13/22 (!) 376 lb 9.6 oz (170.8 kg)     GEN: Patient is in no acute distress HEENT: Normal NECK: No JVD; No carotid bruits LYMPHATICS: No lymphadenopathy  CARDIAC: Hear sounds regular, 2/6 systolic murmur at the apex. RESPIRATORY:  Clear to auscultation without rales, wheezing or rhonchi  ABDOMEN: Soft, non-tender, non-distended MUSCULOSKELETAL:  No edema; No deformity  SKIN: Warm and dry NEUROLOGIC:  Alert and oriented x 3 PSYCHIATRIC:  Normal affect   Signed, Garwin Brothers, MD  05/27/2023 1:03 PM    [facility]

## 2023-05-28 IMAGING — CR DG CHEST 2V
2 series · 2 of 2 positions shown · non-contrast
Comparison: 03/30/2021

CLINICAL DATA: Preop bypass

EXAM:
CHEST - 2 VIEW

[chest pa]
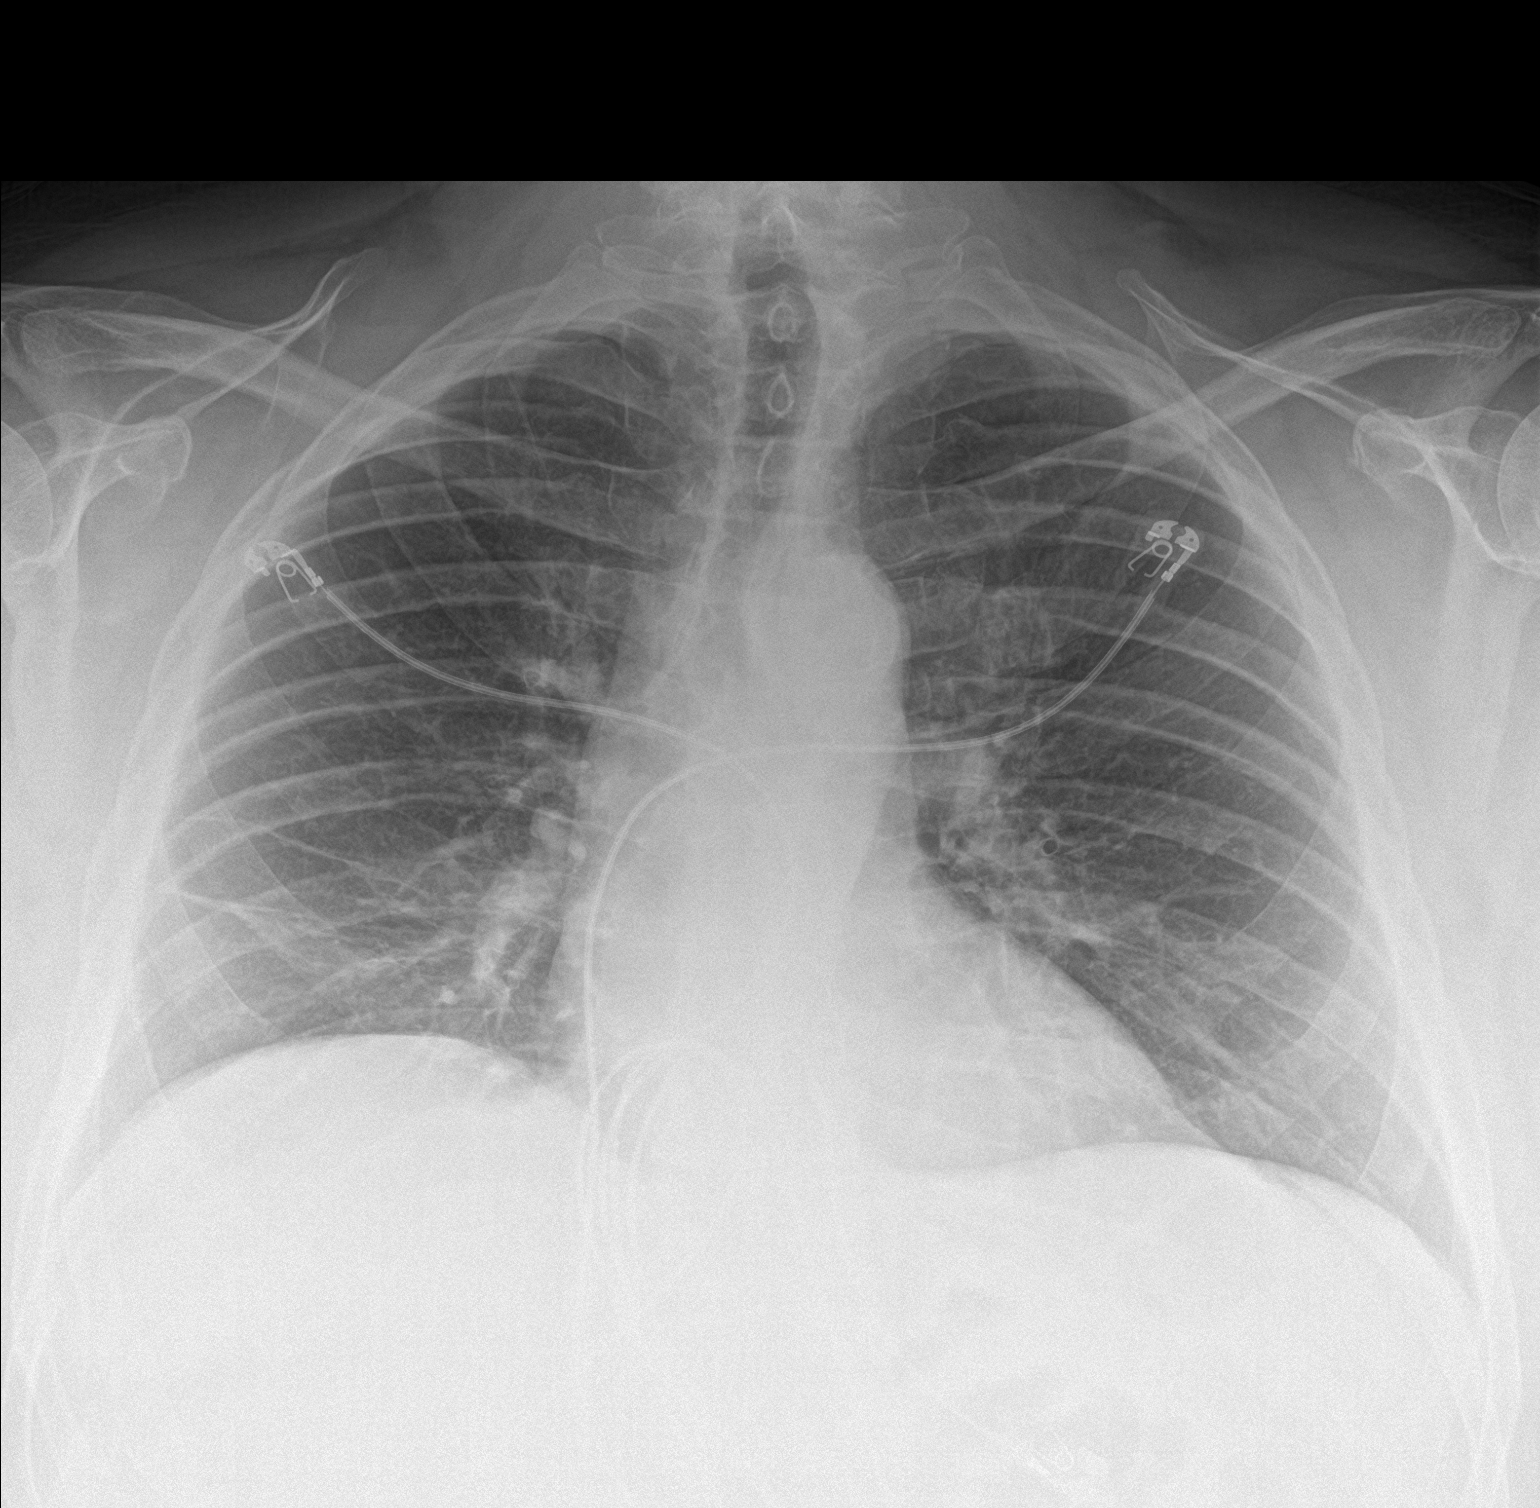

[chest lat]
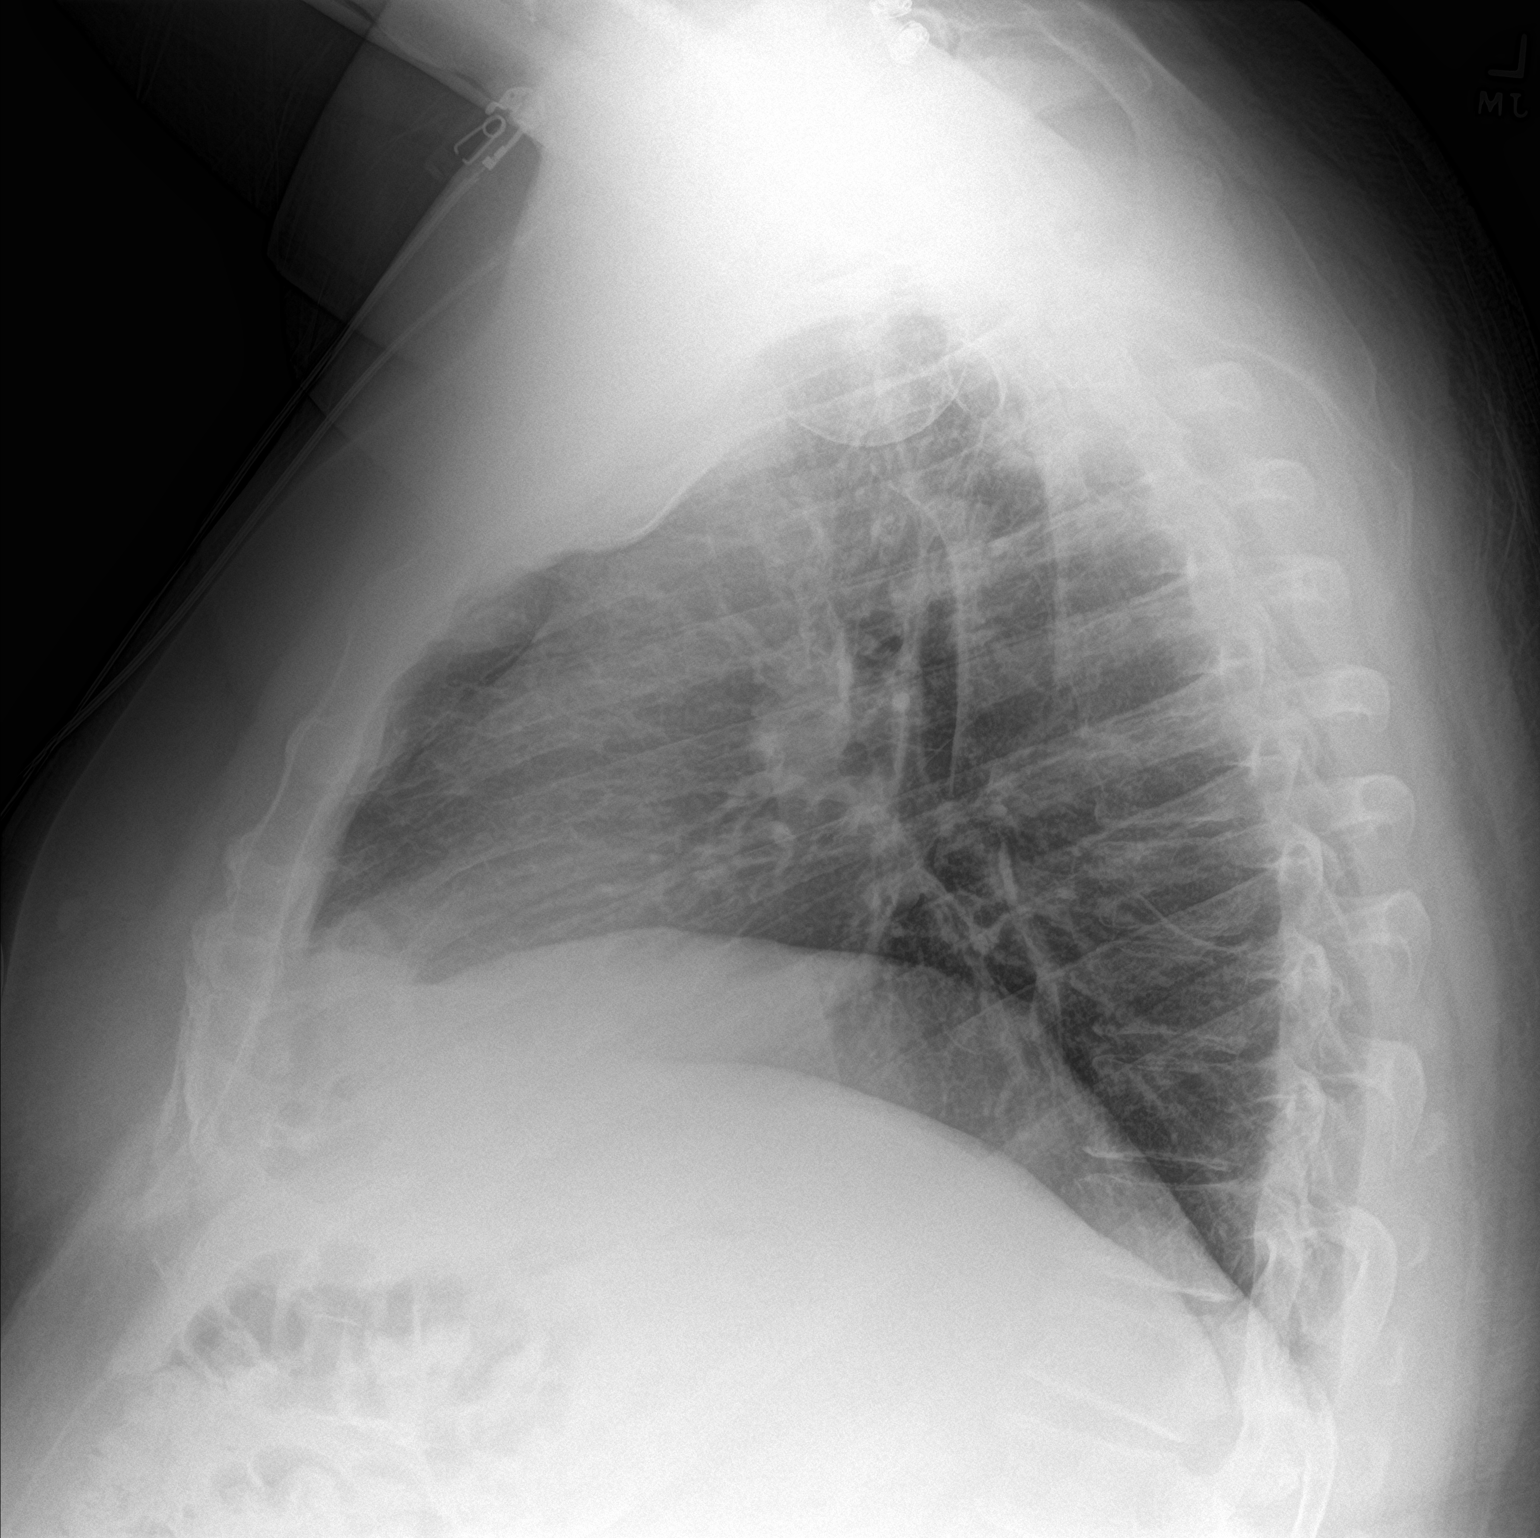

[2 of 2 positions shown; findings below may reference images not displayed]

FINDINGS: Streaky atelectasis or scarring at the right base. No acute
consolidation or effusion. Normal cardiac size. No pneumothorax.
IMPRESSION: No active cardiopulmonary disease. Streaky scarring or atelectasis
at the right base

## 2023-05-31 IMAGING — DX DG CHEST 1V PORT
1 series · 1 of 1 positions shown · non-contrast
Comparison: April 06, 2021

CLINICAL DATA: Pleural effusion. History of coronary artery
disease.

EXAM:
PORTABLE CHEST 1 VIEW

[chest]
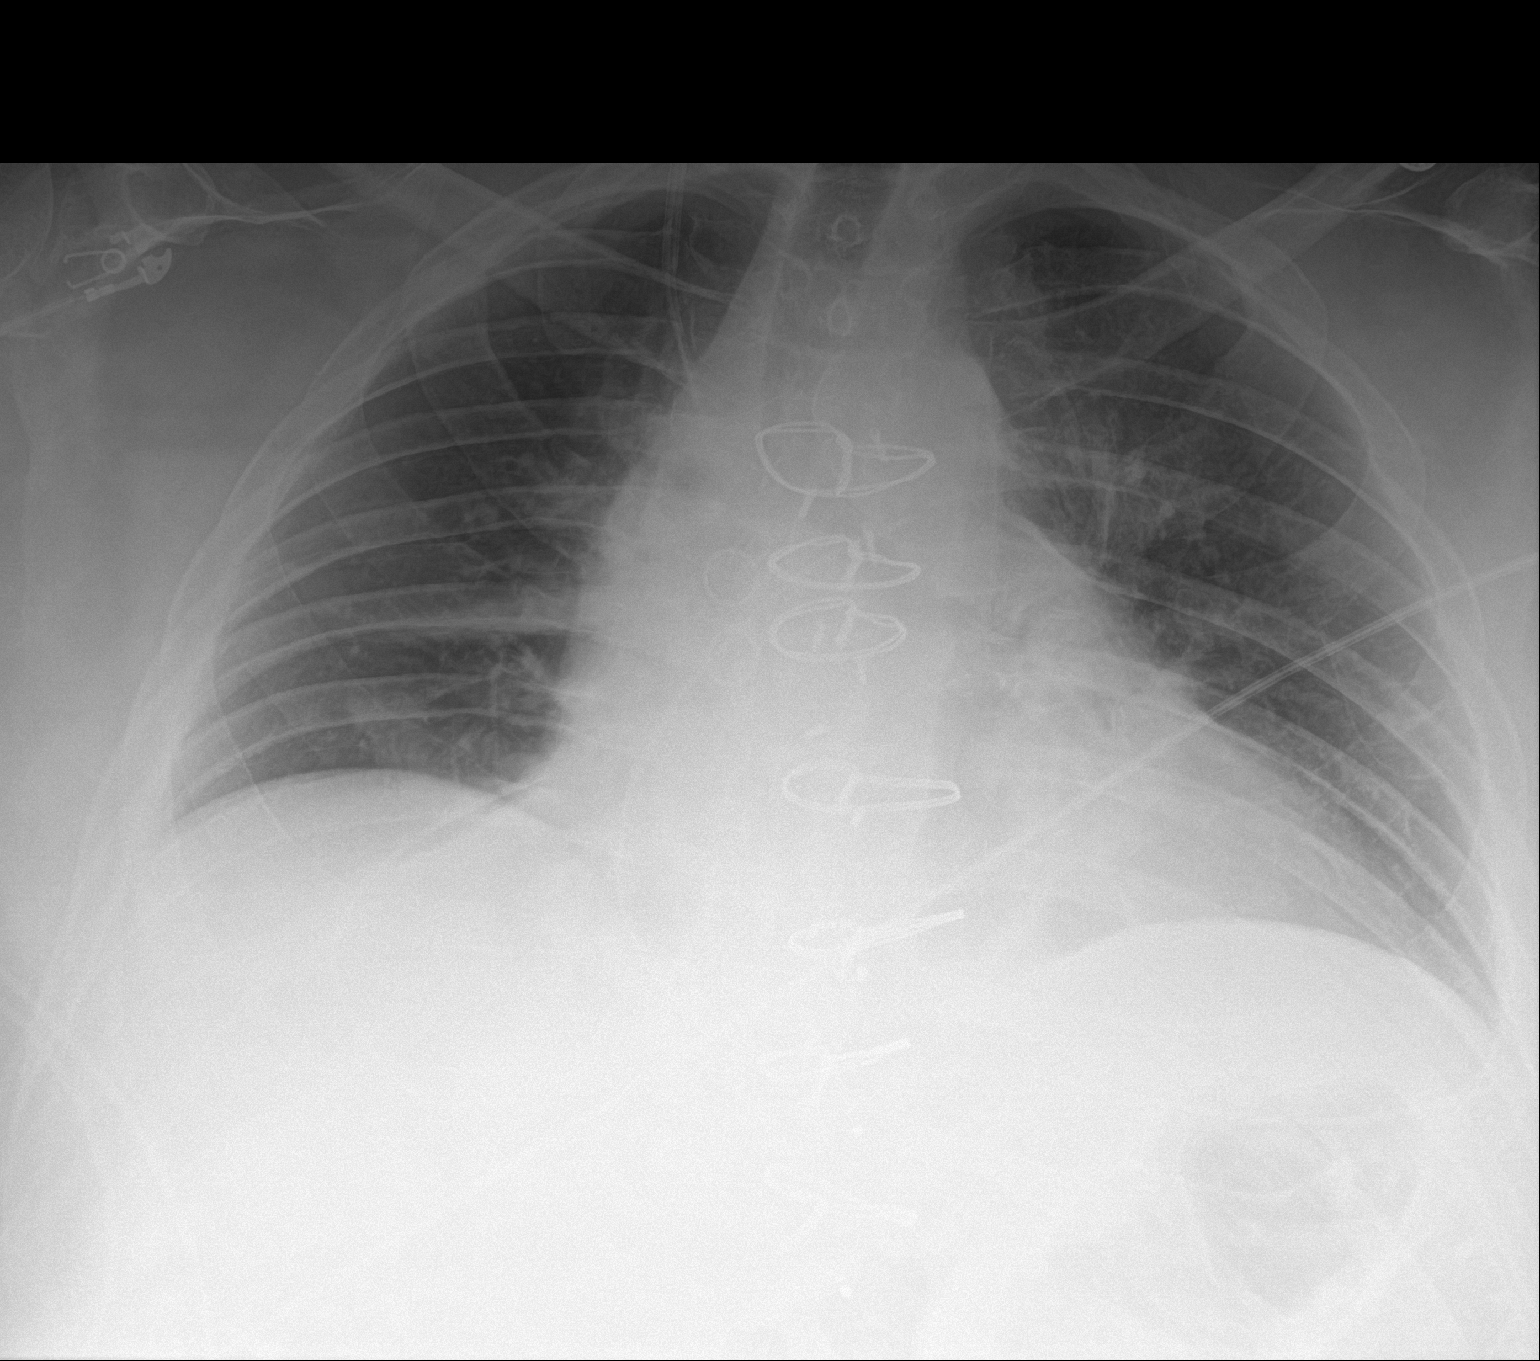

[1 of 1 positions shown; findings below may reference images not displayed]

FINDINGS: Stable right IJ. No pneumothorax. No nodules or masses. The
cardiomediastinal silhouette is stable. No acute abnormalities.
IMPRESSION: No active disease.

## 2023-06-24 ENCOUNTER — Other Ambulatory Visit: Payer: Self-pay | Admitting: Cardiology

## 2023-06-25 ENCOUNTER — Ambulatory Visit: Payer: BC Managed Care – PPO | Attending: Cardiology

## 2023-06-25 DIAGNOSIS — I25110 Atherosclerotic heart disease of native coronary artery with unstable angina pectoris: Secondary | ICD-10-CM | POA: Diagnosis not present

## 2023-06-25 LAB — ECHOCARDIOGRAM COMPLETE: S' Lateral: 3.5 cm

## 2023-06-25 MED ORDER — PERFLUTREN LIPID MICROSPHERE
1.0000 mL | INTRAVENOUS | Status: AC | PRN
  Administered 2023-06-25: 7 mL via INTRAVENOUS

## 2023-07-01 ENCOUNTER — Telehealth: Payer: Self-pay

## 2023-07-01 NOTE — Telephone Encounter (Signed)
-----   Message from Garwin Brothers sent at 06/25/2023 11:55 AM EDT ----- The results of the study is unremarkable. Please inform patient. I will discuss in detail at next appointment. Cc  primary care/referring physician Garwin Brothers, MD 06/25/2023 11:55 AM

## 2023-07-01 NOTE — Telephone Encounter (Signed)
Left vm to return call.    

## 2023-07-20 ENCOUNTER — Other Ambulatory Visit: Payer: Self-pay | Admitting: Neurology

## 2023-08-08 LAB — LIPID PANEL
Chol/HDL Ratio: 3.8 {ratio} (ref 0.0–5.0)
Cholesterol, Total: 178 mg/dL (ref 100–199)
HDL: 47 mg/dL (ref >39)
LDL Chol Calc (NIH): 111 mg/dL — ABNORMAL HIGH (ref 0–99)
Triglycerides: 110 mg/dL (ref 0–149)
VLDL Cholesterol Cal: 20 mg/dL (ref 5–40)

## 2023-08-08 LAB — COMPREHENSIVE METABOLIC PANEL
ALT: 43 [IU]/L (ref 0–44)
AST: 34 [IU]/L (ref 0–40)
Albumin: 4.3 g/dL (ref 3.8–4.9)
Alkaline Phosphatase: 79 [IU]/L (ref 44–121)
BUN/Creatinine Ratio: 13 (ref 9–20)
BUN: 15 mg/dL (ref 6–24)
Bilirubin Total: 1.4 mg/dL — ABNORMAL HIGH (ref 0.0–1.2)
CO2: 28 mmol/L (ref 20–29)
Calcium: 9.4 mg/dL (ref 8.7–10.2)
Chloride: 99 mmol/L (ref 96–106)
Creatinine, Ser: 1.19 mg/dL (ref 0.76–1.27)
Globulin, Total: 2.1 g/dL (ref 1.5–4.5)
Glucose: 148 mg/dL — ABNORMAL HIGH (ref 70–99)
Potassium: 4.2 mmol/L (ref 3.5–5.2)
Sodium: 140 mmol/L (ref 134–144)
Total Protein: 6.4 g/dL (ref 6.0–8.5)
eGFR: 70 mL/min/{1.73_m2} (ref >59)

## 2023-08-11 ENCOUNTER — Telehealth: Payer: Self-pay

## 2023-08-11 DIAGNOSIS — I25110 Atherosclerotic heart disease of native coronary artery with unstable angina pectoris: Secondary | ICD-10-CM

## 2023-08-11 DIAGNOSIS — E785 Hyperlipidemia, unspecified: Secondary | ICD-10-CM

## 2023-08-11 MED ORDER — ROSUVASTATIN CALCIUM 40 MG PO TABS: 40.0000 mg | ORAL_TABLET | Freq: Every day | ORAL | 3 refills | Status: AC

## 2023-08-11 NOTE — Telephone Encounter (Signed)
Results reviewed with pt as per Dr. Kem Parkinson note.  Pt verbalized understanding and had no additional questions. Routed to PCP.  Pt has not been taking the Rosuvastatin. RX has been sent and pt will return in 6 weeks for labs.

## 2023-08-11 NOTE — Telephone Encounter (Signed)
Left message for patient to call back  

## 2023-08-11 NOTE — Telephone Encounter (Signed)
-----   Message from Aundra Dubin Revankar sent at 08/08/2023 11:10 AM EST ----- Diet and exercise. Is he taking meds regularly? Cc pcp Garwin Brothers, MD 08/08/2023 11:09 AM

## 2023-09-19 ENCOUNTER — Other Ambulatory Visit: Payer: Self-pay | Admitting: Cardiology

## 2023-09-19 DIAGNOSIS — I4891 Unspecified atrial fibrillation: Secondary | ICD-10-CM

## 2023-09-22 NOTE — Telephone Encounter (Signed)
Prescription refill request for Eliquis received. Indication:cad Last office visit:10/24 Scr:1.19  12/24 Age: 58 Weight:161.3  kg  Prescription refilled

## 2023-10-15 LAB — LAB REPORT - SCANNED
A1c: 9.1
Albumin, Urine POC: 134.9
Calcium: 9.4
Creatinine, POC: 186.6 mg/dL
EGFR: 70
Microalb Creat Ratio: 72

## 2024-03-02 ENCOUNTER — Ambulatory Visit: Admitting: Cardiology

## 2024-03-27 ENCOUNTER — Other Ambulatory Visit: Payer: Self-pay | Admitting: Cardiology

## 2024-03-27 DIAGNOSIS — I4891 Unspecified atrial fibrillation: Secondary | ICD-10-CM

## 2024-03-29 NOTE — Telephone Encounter (Signed)
 Prescription refill request for Eliquis  received. Indication: PAF Last office visit: 05/27/23  R Revankar MD Scr: 1.20 on 10/15/23  Epic Age: 58 Weight: 161.3kg  Based on above findings Eliquis  5mg  twice daily Is the appropriate dose.  Refill approved.

## 2024-05-02 ENCOUNTER — Other Ambulatory Visit: Payer: Self-pay | Admitting: Cardiology

## 2024-05-02 DIAGNOSIS — E785 Hyperlipidemia, unspecified: Secondary | ICD-10-CM

## 2024-05-11 LAB — LAB REPORT - SCANNED
A1c: 9.8
EGFR: 69

## 2024-06-04 ENCOUNTER — Other Ambulatory Visit: Payer: Self-pay | Admitting: Cardiology

## 2024-06-04 DIAGNOSIS — E785 Hyperlipidemia, unspecified: Secondary | ICD-10-CM

## 2024-06-27 ENCOUNTER — Other Ambulatory Visit: Payer: Self-pay | Admitting: Cardiology

## 2024-07-06 ENCOUNTER — Ambulatory Visit: Attending: Cardiology | Admitting: Cardiology

## 2024-07-06 ENCOUNTER — Encounter: Payer: Self-pay | Admitting: Cardiology

## 2024-07-06 VITALS — BP 140/90 | HR 58 | Ht 75.0 in | Wt 354.6 lb

## 2024-07-06 DIAGNOSIS — I25110 Atherosclerotic heart disease of native coronary artery with unstable angina pectoris: Secondary | ICD-10-CM

## 2024-07-06 DIAGNOSIS — I10 Essential (primary) hypertension: Secondary | ICD-10-CM | POA: Diagnosis not present

## 2024-07-06 DIAGNOSIS — E088 Diabetes mellitus due to underlying condition with unspecified complications: Secondary | ICD-10-CM

## 2024-07-06 DIAGNOSIS — G4733 Obstructive sleep apnea (adult) (pediatric): Secondary | ICD-10-CM | POA: Diagnosis not present

## 2024-07-06 DIAGNOSIS — E785 Hyperlipidemia, unspecified: Secondary | ICD-10-CM | POA: Diagnosis not present

## 2024-07-06 DIAGNOSIS — Z6841 Body Mass Index (BMI) 40.0 and over, adult: Secondary | ICD-10-CM

## 2024-07-06 DIAGNOSIS — E1169 Type 2 diabetes mellitus with other specified complication: Secondary | ICD-10-CM

## 2024-07-06 DIAGNOSIS — Z951 Presence of aortocoronary bypass graft: Secondary | ICD-10-CM

## 2024-07-06 NOTE — Progress Notes (Signed)
 Cardiology Office Note:    Date:  07/06/2024   ID:  Kevin Lawrence, DOB 09-19-1965, MRN [id number]  PCP:  Elaine Garnette BIRCH., MD  Cardiologist:  Jennifer JONELLE Crape, MD   Referring MD: Elaine Garnette BIRCH., MD    ASSESSMENT:    1. Dyslipidemia   2. Coronary artery disease involving native coronary artery of native heart with unstable angina pectoris (HCC)   3. Essential hypertension   4. OSA on CPAP   5. Diabetes mellitus due to underlying condition with unspecified complications (HCC)   6. Hyperlipidemia associated with type 2 diabetes mellitus (HCC)   7. Morbid obesity with BMI of 45.0-49.9, adult (HCC)   8. S/P CABG x 4    PLAN:    In order of problems listed above:  Coronary artery disease: Secondary prevention stressed with the patient.  Importance of compliance with diet medication stressed and patient verbalized standing.  He was advised to walk on a regular basis at least half an hour on a daily basis and he promises to do so. Atrial fibrillation:I discussed with the patient atrial fibrillation, disease process. Management and therapy including rate and rhythm control, anticoagulation benefits and potential risks were discussed extensively with the patient. Patient had multiple questions which were answered to patient's satisfaction. Essential hypertension: Blood pressure stable and diet was emphasized.  He has an element of whitecoat hypertension.  He mentions to me blood pressure readings at home and they are fine. Mixed dyslipidemia: On lipid-lowering medications.  Lipids were discussed with him.SABRA Uncontrolled diabetes mellitus: And morbid obesity: Lifestyle modification urged and he promises to do better.  Hemoglobin A1c is markedly elevated.  Risks explained. Patient will be seen in follow-up appointment in 6 months or earlier if the patient has any concerns.    Medication Adjustments/Labs and Tests Ordered: Current medicines are reviewed at length with the patient  today.  Concerns regarding medicines are outlined above.  Orders Placed This Encounter  Procedures   EKG 12-Lead   No orders of the defined types were placed in this encounter.    No chief complaint on file.    History of Present Illness:    Kevin Lawrence is a 58 y.o. male.  Patient has past medical history of coronary artery disease, essential hypertension, mixed dyslipidemia and diabetes mellitus.  He is morbidly obese.  He is active but does not exercise on a regular basis.  He denies any chest pain orthopnea or PND.  At the time of my evaluation, the patient is alert awake oriented and in no distress.  He has atrial fibrillation.  Asymptomatic at this time.  He is on anticoagulation.  Past Medical History:  Diagnosis Date   Acute coronary syndrome (HCC) 04/02/2021   Capsulitis of metatarsophalangeal (MTP) joint of right foot 06/11/2016   Chest pain 04/02/2021   Contusion of right heel 06/09/2017   Coronary artery disease involving native coronary artery of native heart with unstable angina pectoris (HCC) 03/10/2015   Cath 2016:  100% mid Circumflex 90% Om ---> 0% with stent Mid RCA restenosis treated with Angiosculpt Moderate mid LAD   Diabetes mellitus due to underlying condition with unspecified complications (HCC) 06/19/2015   Dizzy spells    Dyslipidemia 03/10/2015   Essential hypertension 03/10/2015   Foot pain, bilateral 09/29/2020   Hyperlipidemia associated with type 2 diabetes mellitus (HCC) 03/10/2015   Ingrown nail 07/28/2018   Injury of toenail of left foot 06/21/2016   Loose toenail 06/21/2016   Morbid  obesity with BMI of 45.0-49.9, adult (HCC) 03/10/2015   Nondisplaced fracture of body of right calcaneus, initial encounter for closed fracture 06/09/2017   OSA on CPAP 02/24/2017   Osteoarthritis of left knee 04/30/2023   Pain in joint of left knee 04/30/2023   Plantar callus 05/13/2019   Postoperative atrial fibrillation (HCC) 06/22/2021   Right foot pain  06/09/2017   S/P CABG x 4 04/05/2021   SOB (shortness of breath) 10/01/2017   SVT (supraventricular tachycardia) 02/21/2017   Tremor 06/17/2022   Uncontrolled type 2 diabetes mellitus without complication, with long-term current use of insulin  06/11/2016    Past Surgical History:  Procedure Laterality Date   CORONARY ARTERY BYPASS GRAFT N/A 04/05/2021   Procedure: CORONARY ARTERY BYPASS GRAFTING (CABG) X 4, USING LEFT INTERNAL MAMMARY ARTERY, LEFT RADIAL ARTERY, AND RIGHT LEG GREATER SAPHENOUS VEIN HARVESTED ENDOSCOPICALLY;  Surgeon: Shyrl Linnie KIDD, MD;  Location: [facility] OR;  Service: Open Heart Surgery;  Laterality: N/A;   CORONARY PRESSURE/FFR STUDY N/A 04/02/2021   Procedure: INTRAVASCULAR PRESSURE WIRE/FFR STUDY;  Surgeon: Anner Alm ORN, MD;  Location[facility] INVASIVE CV LAB;  Service: Cardiovascular;  Laterality: N/A;   LEFT HEART CATH AND CORONARY ANGIOGRAPHY N/A 10/06/2017   Procedure: LEFT HEART CATH AND CORONARY ANGIOGRAPHY;  Surgeon: Claudene Victory ORN, MD;  Location: [facility] INVASIVE CV LAB;  Service: Cardiovascular;  Laterality: N/A;   LEFT HEART CATH AND CORONARY ANGIOGRAPHY N/A 04/02/2021   Procedure: LEFT HEART CATH AND CORONARY ANGIOGRAPHY;  Surgeon: Anner Alm ORN, MD;  Location: [facility] INVASIVE CV LAB;  Service: Cardiovascular;  Laterality: N/A;   RADIAL ARTERY HARVEST Left 04/05/2021   Procedure: RADIAL ARTERY HARVEST;  Surgeon: Shyrl Linnie KIDD, MD;  Location: [facility] OR;  Service: Open Heart Surgery;  Laterality: Left;   TEE WITHOUT CARDIOVERSION N/A 04/05/2021   Procedure: TRANSESOPHAGEAL ECHOCARDIOGRAM (TEE);  Surgeon: Shyrl Linnie KIDD, MD;  Location: [facility] OR;  Service: Open Heart Surgery;  Laterality: N/A;    Current Medications: Current Meds  Medication Sig   albuterol (VENTOLIN HFA) 108 (90 Base) MCG/ACT inhaler Inhale 2 puffs into the lungs every 6 (six) hours as needed for wheezing or shortness of breath.   amLODipine  (NORVASC ) 10 MG tablet Take 10 mg by mouth daily.     aspirin  EC 81 MG tablet Take 81 mg by mouth daily.    Biotin 89999 MCG TABS Take 1 tablet by mouth daily.   budesonide-formoterol  (SYMBICORT) 160-4.5 MCG/ACT inhaler Inhale 2 puffs into the lungs daily.   Cholecalciferol (D3-50 PO) Take 1 tablet by mouth daily.   colesevelam  (WELCHOL ) 625 MG tablet Take 1,875 mg by mouth 2 (two) times daily with a meal.    ELIQUIS  5 MG TABS tablet TAKE 1 TABLET BY MOUTH TWICE A DAY   ezetimibe  (ZETIA ) 10 MG tablet TAKE 1 TABLET BY MOUTH DAILY. PLEASE MAKE AN APPOINTMENT IN ORDER TO RECEIVE ADDITIONAL REFILLS, FIRST ATTEMPT   famotidine  (PEPCID ) 20 MG tablet Take 20 mg by mouth daily as needed for heartburn.   gabapentin (NEURONTIN) 100 MG capsule Take 200 mg by mouth at bedtime.   metoprolol  tartrate (LOPRESSOR ) 50 MG tablet TAKE 1 TABLET BY MOUTH TWICE A DAY   nitroGLYCERIN  (NITROSTAT ) 0.4 MG SL tablet Place 1 tablet (0.4 mg total) under the tongue every 5 (five) minutes as needed.   nystatin cream (MYCOSTATIN) Apply 1 application topically 2 (two) times daily. Mixed with kenalog and put into ears   OZEMPIC, 2 MG/DOSE, 8 MG/3ML SOPN Inject 2 mg into  the skin once a week.   pioglitazone-metformin (ACTOPLUS MET) 15-850 MG tablet Take 1 tablet by mouth 2 (two) times daily with a meal.    rosuvastatin  (CRESTOR ) 40 MG tablet Take 1 tablet (40 mg total) by mouth daily.   TOUJEO  SOLOSTAR 300 UNIT/ML SOPN Inject 80 Units into the skin daily.   valsartan (DIOVAN) 320 MG tablet Take 320 mg by mouth daily.   [DISCONTINUED] folic acid (FOLVITE) 400 MCG tablet Take 400 mcg by mouth daily.   [DISCONTINUED] liraglutide (VICTOZA) 18 MG/3ML SOPN Inject 1.8 mg into the skin daily.     Allergies:   Codeine, Hydrocodone-acetaminophen , and Hydrocodone   Social History   Socioeconomic History   Marital status: Single    Spouse name: Not on file   Number of children: Not on file   Years of education: Not on file   Highest education level: Not on file  Occupational History    Not on file  Tobacco Use   Smoking status: Former   Smokeless tobacco: Former  Substance and Sexual Activity   Alcohol use: Yes    Alcohol/week: 1.0 standard drink of alcohol    Types: 1 Standard drinks or equivalent per week   Drug use: Not on file   Sexual activity: Not on file  Other Topics Concern   Not on file  Social History Narrative   Not on file   Social Drivers of Health   Financial Resource Strain: Not on file  Food Insecurity: Not on file  Transportation Needs: Not on file  Physical Activity: Not on file  Stress: Not on file  Social Connections: Not on file     Family History: The patient's family history includes Cancer in his mother; Heart disease in his father; Stroke in his mother.  ROS:   Please see the history of present illness.    All other systems reviewed and are negative.  EKGs/Labs/Other Studies Reviewed:    The following studies were reviewed today: .SABRAEKG Interpretation Date/Time:  Tuesday July 06 2024 10:35:11 EST Ventricular Rate:  58 PR Interval:    QRS Duration:  86 QT Interval:  448 QTC Calculation: 439 R Axis:   -10  Text Interpretation: Atrial fibrillation with slow ventricular response with premature ventricular or aberrantly conducted complexes Low voltage QRS Inferior infarct (cited on or before 27-May-2023) Cannot rule out Anterior infarct (cited on or before 27-May-2023) Abnormal ECG When compared with ECG of 27-May-2023 12:47, Atrial fibrillation has replaced Sinus rhythm Questionable change in initial forces of Anterior leads Nonspecific T wave abnormality now evident in Inferior leads T wave inversion no longer evident in Lateral leads Confirmed by Edwyna Backers [phone]) on 07/06/2024 10:44:16 AM     Recent Labs: 08/07/2023: ALT 43; BUN 15; Creatinine, Ser 1.19; Potassium 4.2; Sodium 140  Recent Lipid Panel    Component Value Date/Time   CHOL 178 08/07/2023 0812   TRIG 110 08/07/2023 0812   HDL 47 08/07/2023 0812    CHOLHDL 3.8 08/07/2023 0812   LDLCALC 111 (H) 08/07/2023 0812    Physical Exam:    VS:  BP (!) 140/90   Pulse (!) 58   Ht 6' 3 (1.905 m)   Wt (!) 354 lb 9.6 oz (160.8 kg)   SpO2 96%   BMI 44.32 kg/m     Wt Readings from Last 3 Encounters:  07/06/24 (!) 354 lb 9.6 oz (160.8 kg)  05/27/23 (!) 355 lb 9.6 oz (161.3 kg)  06/17/22 (!) 375 lb 8 oz (  170.3 kg)     GEN: Patient is in no acute distress HEENT: Normal NECK: No JVD; No carotid bruits LYMPHATICS: No lymphadenopathy CARDIAC: Hear sounds regular, 2/6 systolic murmur at the apex. RESPIRATORY:  Clear to auscultation without rales, wheezing or rhonchi  ABDOMEN: Soft, non-tender, non-distended MUSCULOSKELETAL:  No edema; No deformity  SKIN: Warm and dry NEUROLOGIC:  Alert and oriented x 3 PSYCHIATRIC:  Normal affect   Signed, Jennifer JONELLE Crape, MD  07/06/2024 10:45 AM    [facility]

## 2024-07-06 NOTE — Patient Instructions (Signed)
Medication Instructions:  Your physician recommends that you continue on your current medications as directed. Please refer to the Current Medication list given to you today.  *If you need a refill on your cardiac medications before your next appointment, please call your pharmacy*   Lab Work: None Ordered If you have labs (blood work) drawn today and your tests are completely normal, you will receive your results only by: MyChart Message (if you have MyChart) OR A paper copy in the mail If you have any lab test that is abnormal or we need to change your treatment, we will call you to review the results.   Testing/Procedures: None Ordered   Follow-Up: At [facility], you and your health needs are our priority.  As part of our continuing mission to provide you with exceptional heart care, we have created designated Provider Care Teams.  These Care Teams include your primary Cardiologist (physician) and Advanced Practice Providers (APPs -  Physician Assistants and Nurse Practitioners) who all work together to provide you with the care you need, when you need it.  We recommend signing up for the patient portal called "MyChart".  Sign up information is provided on this After Visit Summary.  MyChart is used to connect with patients for Virtual Visits (Telemedicine).  Patients are able to view lab/test results, encounter notes, upcoming appointments, etc.  Non-urgent messages can be sent to your provider as well.   To learn more about what you can do with MyChart, go to [email or web address].    Your next appointment:   9 month(s)  The format for your next appointment:   In Person  Provider:   Rajan Revankar, MD    Other Instructions NA  

## 2024-07-07 ENCOUNTER — Other Ambulatory Visit: Payer: Self-pay | Admitting: Cardiology

## 2024-08-04 ENCOUNTER — Other Ambulatory Visit: Payer: Self-pay | Admitting: Cardiology

## 2024-08-04 DIAGNOSIS — E785 Hyperlipidemia, unspecified: Secondary | ICD-10-CM

## 2024-08-07 ENCOUNTER — Other Ambulatory Visit: Payer: Self-pay | Admitting: Cardiology

## 2024-08-07 DIAGNOSIS — I25110 Atherosclerotic heart disease of native coronary artery with unstable angina pectoris: Secondary | ICD-10-CM

## 2024-09-25 ENCOUNTER — Other Ambulatory Visit: Payer: Self-pay | Admitting: Cardiology

## 2024-09-25 DIAGNOSIS — I4891 Unspecified atrial fibrillation: Secondary | ICD-10-CM

## 2024-09-30 ENCOUNTER — Encounter: Payer: Self-pay | Admitting: Cardiology

## 2024-09-30 ENCOUNTER — Ambulatory Visit: Admitting: Cardiology

## 2024-09-30 VITALS — BP 138/80 | HR 80 | Ht 75.0 in | Wt 352.0 lb

## 2024-09-30 DIAGNOSIS — I10 Essential (primary) hypertension: Secondary | ICD-10-CM

## 2024-09-30 DIAGNOSIS — R079 Chest pain, unspecified: Secondary | ICD-10-CM

## 2024-09-30 DIAGNOSIS — Z951 Presence of aortocoronary bypass graft: Secondary | ICD-10-CM

## 2024-09-30 DIAGNOSIS — Z6841 Body Mass Index (BMI) 40.0 and over, adult: Secondary | ICD-10-CM | POA: Diagnosis not present

## 2024-09-30 DIAGNOSIS — E1169 Type 2 diabetes mellitus with other specified complication: Secondary | ICD-10-CM

## 2024-09-30 DIAGNOSIS — E785 Hyperlipidemia, unspecified: Secondary | ICD-10-CM

## 2024-09-30 DIAGNOSIS — E088 Diabetes mellitus due to underlying condition with unspecified complications: Secondary | ICD-10-CM

## 2024-09-30 NOTE — Progress Notes (Signed)
 " Cardiology Office Note:    Date:  09/30/2024   ID:  Kevin Lawrence, DOB [birth year], MRN [id number]  PCP:  Elaine Garnette BIRCH., MD  Cardiologist:  Jennifer JONELLE Crape, MD   Referring MD: Elaine Garnette BIRCH., MD    ASSESSMENT:    1. Essential hypertension   2. Diabetes mellitus due to underlying condition with unspecified complications (HCC)   3. Hyperlipidemia associated with type 2 diabetes mellitus (HCC)   4. S/P CABG x 4   5. Morbid obesity with BMI of 45.0-49.9, adult (HCC)    PLAN:    In order of problems listed above:  Coronary artery disease: Chest pain: Atypical in nature but in view of multiple comorbidities and not we will optimize risk I advised the patient about a Lexiscan  sestamibi to rule out ischemic substrate and he is agreeable.  Secondary prevention stressed with the patient.  Importance of compliance with diet and medication stressed any vocalized understanding. Essential hypertension: Blood pressure is stable and diet was emphasized.  Lifestyle modification urged. Mixed dyslipidemia: On lipid-lowering medications followed by primary care. Diabetes mellitus and obesity: Lifestyle modifications stressed weight reduction stressed risks of obesity explained.  Diet was emphasized for diabetes mellitus and questions were answered to satisfaction. Patient will be seen in follow-up appointment in 6 months or earlier if the patient has any concerns.    Medication Adjustments/Labs and Tests Ordered: Current medicines are reviewed at length with the patient today.  Concerns regarding medicines are outlined above.  No orders of the defined types were placed in this encounter.  No orders of the defined types were placed in this encounter.    No chief complaint on file.    History of Present Illness:    Kevin Lawrence is a 59 y.o. male.  Patient has past medical history of coronary artery disease post CABG surgery, essential hypertension, mixed dyslipidemia and diabetes  mellitus.  He mentions to me that he went to the ER recently.  He was treated for pulmonary flare including steroids.  Currently is feeling some better.  He occasionally has chest pain.  It is not related to exertion.  He leads a sedentary lifestyle.  At the time of my evaluation, the patient is alert awake oriented and in no distress.  Past Medical History:  Diagnosis Date   Acute coronary syndrome (HCC) 04/02/2021   Capsulitis of metatarsophalangeal (MTP) joint of right foot 06/11/2016   Chest pain 04/02/2021   Contusion of right heel 06/09/2017   Coronary artery disease involving native coronary artery of native heart with unstable angina pectoris (HCC) 03/10/2015   Cath 2016:  100% mid Circumflex 90% Om ---> 0% with stent Mid RCA restenosis treated with Angiosculpt Moderate mid LAD   Diabetes mellitus due to underlying condition with unspecified complications (HCC) 06/19/2015   Dizzy spells    Dyslipidemia 03/10/2015   Essential hypertension 03/10/2015   Foot pain, bilateral 09/29/2020   Hyperlipidemia associated with type 2 diabetes mellitus (HCC) 03/10/2015   Ingrown nail 07/28/2018   Injury of toenail of left foot 06/21/2016   Loose toenail 06/21/2016   Morbid obesity with BMI of 45.0-49.9, adult (HCC) 03/10/2015   Nondisplaced fracture of body of right calcaneus, initial encounter for closed fracture 06/09/2017   OSA on CPAP 02/24/2017   Osteoarthritis of left knee 04/30/2023   Pain in joint of left knee 04/30/2023   Plantar callus 05/13/2019   Postoperative atrial fibrillation (HCC) 06/22/2021   Right foot pain 06/09/2017  S/P CABG x 4 04/05/2021   SOB (shortness of breath) 10/01/2017   SVT (supraventricular tachycardia) 02/21/2017   Tremor 06/17/2022   Uncontrolled type 2 diabetes mellitus without complication, with long-term current use of insulin  06/11/2016    Past Surgical History:  Procedure Laterality Date   CORONARY ARTERY BYPASS GRAFT N/A 04/05/2021   Procedure:  CORONARY ARTERY BYPASS GRAFTING (CABG) X 4, USING LEFT INTERNAL MAMMARY ARTERY, LEFT RADIAL ARTERY, AND RIGHT LEG GREATER SAPHENOUS VEIN HARVESTED ENDOSCOPICALLY;  Surgeon: Shyrl Linnie KIDD, MD;  Location: [facility] OR;  Service: Open Heart Surgery;  Laterality: N/A;   CORONARY PRESSURE/FFR STUDY N/A 04/02/2021   Procedure: INTRAVASCULAR PRESSURE WIRE/FFR STUDY;  Surgeon: Anner Alm ORN, MD;  Location: [facility] INVASIVE CV LAB;  Service: Cardiovascular;  Laterality: N/A;   LEFT HEART CATH AND CORONARY ANGIOGRAPHY N/A 10/06/2017   Procedure: LEFT HEART CATH AND CORONARY ANGIOGRAPHY;  Surgeon: Claudene Victory ORN, MD;  Location: [facility] INVASIVE CV LAB;  Service: Cardiovascular;  Laterality: N/A;   LEFT HEART CATH AND CORONARY ANGIOGRAPHY N/A 04/02/2021   Procedure: LEFT HEART CATH AND CORONARY ANGIOGRAPHY;  Surgeon: Anner Alm ORN, MD;  Location: [facility] INVASIVE CV LAB;  Service: Cardiovascular;  Laterality: N/A;   RADIAL ARTERY HARVEST Left 04/05/2021   Procedure: RADIAL ARTERY HARVEST;  Surgeon: Shyrl Linnie KIDD, MD;  Location: [facility] OR;  Service: Open Heart Surgery;  Laterality: Left;   TEE WITHOUT CARDIOVERSION N/A 04/05/2021   Procedure: TRANSESOPHAGEAL ECHOCARDIOGRAM (TEE);  Surgeon: Shyrl Linnie KIDD, MD;  Location[facility] OR;  Service: Open Heart Surgery;  Laterality: N/A;    Current Medications: Active Medications[1]   Allergies:   Codeine, Hydrocodone-acetaminophen , and Hydrocodone   Social History   Socioeconomic History   Marital status: Single    Spouse name: Not on file   Number of children: Not on file   Years of education: Not on file   Highest education level: Not on file  Occupational History   Not on file  Tobacco Use   Smoking status: Former   Smokeless tobacco: Former  Substance and Sexual Activity   Alcohol use: Yes    Alcohol/week: 1.0 standard drink of alcohol    Types: 1 Standard drinks or equivalent per week   Drug use: Not on file   Sexual activity: Not on file  Other Topics  Concern   Not on file  Social History Narrative   Not on file   Social Drivers of Health   Tobacco Use: Medium Risk (09/30/2024)   Patient History    Smoking Tobacco Use: Former    Smokeless Tobacco Use: Former    Passive Exposure: Not on Stage Manager: Not on [employer] Insecurity: Not on file  Transportation Needs: Not on file  Physical Activity: Not on file  Stress: Not on file  Social Connections: Not on file  Depression (PHQ2-9): Not on file  Alcohol Screen: Not on file  Housing: Not on file  Utilities: Not on file  Health Literacy: Not on file     Family History: The patient's family history includes Cancer in his mother; Heart disease in his father; Stroke in his mother.  ROS:   Please see the history of present illness.    All other systems reviewed and are negative.  EKGs/Labs/Other Studies Reviewed:    The following studies were reviewed today: .SABRA   I reviewed records from [place] [facility].   Recent Labs: No results found for requested labs within last 365 days.  Recent  Lipid Panel    Component Value Date/Time   CHOL 178 08/07/2023 0812   TRIG 110 08/07/2023 0812   HDL 47 08/07/2023 0812   CHOLHDL 3.8 08/07/2023 0812   LDLCALC 111 (H) 08/07/2023 0812    Physical Exam:    VS:  BP 138/80   Pulse 80   Ht 6' 3 (1.905 m)   Wt (!) 352 lb (159.7 kg)   SpO2 96%   BMI 44.00 kg/m     Wt Readings from Last 3 Encounters:  09/30/24 (!) 352 lb (159.7 kg)  07/06/24 (!) 354 lb 9.6 oz (160.8 kg)  05/27/23 (!) 355 lb 9.6 oz (161.3 kg)     GEN: Patient is in no acute distress HEENT: Normal NECK: No JVD; No carotid bruits LYMPHATICS: No lymphadenopathy CARDIAC: Hear sounds regular, 2/6 systolic murmur at the apex. RESPIRATORY:  Clear to auscultation without rales, wheezing or rhonchi  ABDOMEN: Soft, non-tender, non-distended MUSCULOSKELETAL:  No edema; No deformity  SKIN: Warm and dry NEUROLOGIC:  Alert and oriented x 3 PSYCHIATRIC:   Normal affect   Signed, Jennifer JONELLE Crape, MD  09/30/2024 2:26 PM     [facility]     [1]  No outpatient medications have been marked as taking for the 09/30/24 encounter (Office Visit) with Justin Meisenheimer, Jennifer JONELLE, MD.   "

## 2024-09-30 NOTE — Patient Instructions (Signed)
 Medication Instructions:  Your physician recommends that you continue on your current medications as directed. Please refer to the Current Medication list given to you today.  *If you need a refill on your cardiac medications before your next appointment, please call your pharmacy*   Lab Work: None ordered If you have labs (blood work) drawn today and your tests are completely normal, you will receive your results only by: MyChart Message (if you have MyChart) OR A paper copy in the mail If you have any lab test that is abnormal or we need to change your treatment, we will call you to review the results.   Testing/Procedures: You are scheduled for a Myocardial Perfusion Imaging Study.  Please arrive 15 minutes prior to your appointment time for registration and insurance purposes.  The test will take approximately 3 to 4 hours to complete; you may bring reading material.  If someone comes with you to your appointment, they will need to remain in the main lobby due to limited space in the testing area.   How to prepare for your Myocardial Perfusion Test: Do not eat or drink 3 hours prior to your test, except you may have water. Do not consume products containing caffeine (regular or decaffeinated) 12 hours prior to your test. (ex: coffee, chocolate, sodas, tea). Do bring a list of your current medications with you.  If not listed below, you may take your medications as normal. Do not take propranolol  (inderal , Innopran ) for 24 hours prior to the test.  Bring the medication to your appointment as you may be required to take it once the test is complete. Do wear comfortable clothes (no dresses or overalls) and walking shoes, tennis shoes preferred (No heels or open toe shoes are allowed). Do NOT wear cologne, perfume, aftershave, or lotions (deodorant is allowed). If these instructions are not followed, your test will have to be rescheduled.  If you cannot keep your appointment, please  provide 24 hours notification to the Nuclear Lab, to avoid a possible $50 charge to your account.  Follow-Up: At [facility], you and your health needs are our priority.  As part of our continuing mission to provide you with exceptional heart care, we have created designated Provider Care Teams.  These Care Teams include your primary Cardiologist (physician) and Advanced Practice Providers (APPs -  Physician Assistants and Nurse Practitioners) who all work together to provide you with the care you need, when you need it.  We recommend signing up for the patient portal called MyChart.  Sign up information is provided on this After Visit Summary.  MyChart is used to connect with patients for Virtual Visits (Telemedicine).  Patients are able to view lab/test results, encounter notes, upcoming appointments, etc.  Non-urgent messages can be sent to your provider as well.   To learn more about what you can do with MyChart, go to [email or web address].    Your next appointment:   9 month(s)  Provider:   Jennifer Crape, MD   Other Instructions  Cardiac Nuclear Scan A cardiac nuclear scan is a test that is done to check the flow of blood to your heart. It is done when you are resting and when you are exercising. The test looks for problems such as: Not enough blood reaching a portion of the heart. The heart muscle not working as it should. You may need this test if you have: Heart disease. Lab results that are not normal. Had heart surgery or a balloon procedure to  open up blocked arteries (angioplasty) or a small mesh tube (stent). Chest pain. Shortness of breath. Had a heart attack. In this test, a special dye (tracer) is put into your bloodstream. The tracer will travel to your heart. A camera will then take pictures of your heart to see how the tracer moves through your heart. This test is usually done at a hospital and takes 2-4 hours. Tell a doctor about: Any allergies you  have. All medicines you are taking, including vitamins, herbs, eye drops, creams, and over-the-counter medicines. Any bleeding problems you have. Any surgeries you have had. Any medical conditions you have. Whether you are pregnant or may be pregnant. Any history of asthma or long-term (chronic) lung disease. Any history of heart rhythm disorders or heart valve conditions. What are the risks? Your doctor will talk with you about risks. These may include: Serious chest pain and heart attack. This is only a risk if the stress portion of the test is done. Fast or uneven heartbeats (palpitations). A feeling of warmth in your chest. This feeling usually does not last long. Allergic reaction to the tracer. Shortness of breath or trouble breathing. What happens before the test? Ask your doctor about changing or stopping your normal medicines. Follow instructions from your doctor about what you cannot eat or drink. Remove your jewelry on the day of the test. Ask your doctor if you need to avoid nicotine or caffeine. What happens during the test? An IV tube will be inserted into one of your veins. Your doctor will give you a small amount of tracer through the IV tube. You will wait for 20-40 minutes while the tracer moves through your bloodstream. Your heart will be monitored with an electrocardiogram (ECG). You will lie down on an exam table. Pictures of your heart will be taken for about 15-20 minutes. You may also have a stress test. For this test, one of these things may be done: You will be asked to exercise on a treadmill or a stationary bike. You will be given medicines that will make your heart work harder. This is done if you are unable to exercise. When blood flow to your heart has peaked, a tracer will again be given through the IV tube. After 20-40 minutes, you will get back on the exam table. More pictures will be taken of your heart. Depending on the tracer that is used, more  pictures may need to be taken 3-4 hours later. Your IV tube will be removed when the test is over. The test may vary among doctors and hospitals. What happens after the test? Ask your doctor: Whether you can return to your normal schedule, including diet, activities, travel, and medicines. Whether you should drink more fluids. This will help to remove the tracer from your body. Ask your doctor, or the department that is doing the test: When will my results be ready? How will I get my results? What are my treatment options? What other tests do I need? What are my next steps? This information is not intended to replace advice given to you by your health care provider. Make sure you discuss any questions you have with your health care provider. Document Revised: 01/08/2022 Document Reviewed: 01/08/2022 Elsevier Patient Education  2023 [employer].

## 2024-10-19 ENCOUNTER — Ambulatory Visit (HOSPITAL_COMMUNITY)

## 2024-10-20 ENCOUNTER — Ambulatory Visit (HOSPITAL_COMMUNITY)
